# Patient Record
Sex: Male | Born: 1945 | Race: White | Hispanic: No | Marital: Married | Smoking: Never smoker
Health system: Southern US, Community
[De-identification: ages and names within clinical notes are randomized; demographics above are authoritative.]

## PROBLEM LIST (undated history)

## (undated) DIAGNOSIS — D696 Thrombocytopenia, unspecified: Secondary | ICD-10-CM

## (undated) DIAGNOSIS — C801 Malignant (primary) neoplasm, unspecified: Secondary | ICD-10-CM

## (undated) DIAGNOSIS — D72819 Decreased white blood cell count, unspecified: Secondary | ICD-10-CM

## (undated) DIAGNOSIS — D7589 Other specified diseases of blood and blood-forming organs: Secondary | ICD-10-CM

## (undated) DIAGNOSIS — F79 Unspecified intellectual disabilities: Secondary | ICD-10-CM

## (undated) HISTORY — DX: Unspecified intellectual disabilities: F79

## (undated) HISTORY — DX: Malignant (primary) neoplasm, unspecified: C80.1

## (undated) HISTORY — DX: Decreased white blood cell count, unspecified: D72.819

## (undated) HISTORY — DX: Other specified diseases of blood and blood-forming organs: D75.89

## (undated) HISTORY — DX: Thrombocytopenia, unspecified: D69.6

---

## 1999-03-06 ENCOUNTER — Encounter: Payer: Self-pay | Admitting: General Surgery

## 1999-03-06 ENCOUNTER — Encounter: Admission: RE | Admit: 1999-03-06 | Discharge: 1999-03-06 | Payer: Self-pay | Admitting: General Surgery

## 1999-03-08 ENCOUNTER — Ambulatory Visit (HOSPITAL_BASED_OUTPATIENT_CLINIC_OR_DEPARTMENT_OTHER): Admission: RE | Admit: 1999-03-08 | Discharge: 1999-03-08 | Payer: Self-pay | Admitting: General Surgery

## 2005-03-12 ENCOUNTER — Ambulatory Visit: Admission: RE | Admit: 2005-03-12 | Discharge: 2005-06-18 | Payer: Self-pay | Admitting: Radiation Oncology

## 2005-03-21 ENCOUNTER — Encounter: Admission: RE | Admit: 2005-03-21 | Discharge: 2005-03-21 | Payer: Self-pay | Admitting: Urology

## 2005-04-24 ENCOUNTER — Ambulatory Visit (HOSPITAL_BASED_OUTPATIENT_CLINIC_OR_DEPARTMENT_OTHER): Admission: RE | Admit: 2005-04-24 | Discharge: 2005-04-24 | Payer: Self-pay | Admitting: Urology

## 2009-11-22 ENCOUNTER — Ambulatory Visit: Payer: Self-pay | Admitting: Internal Medicine

## 2009-11-22 DIAGNOSIS — F79 Unspecified intellectual disabilities: Secondary | ICD-10-CM | POA: Insufficient documentation

## 2009-11-22 DIAGNOSIS — C61 Malignant neoplasm of prostate: Secondary | ICD-10-CM

## 2009-11-23 LAB — CONVERTED CEMR LAB
ALT: 19 units/L (ref 0–53)
AST: 23 units/L (ref 0–37)
Albumin: 4.3 g/dL (ref 3.5–5.2)
Alkaline Phosphatase: 55 units/L (ref 39–117)
BUN: 17 mg/dL (ref 6–23)
Basophils Absolute: 0 10*3/uL (ref 0.0–0.1)
Basophils Relative: 0.4 % (ref 0.0–3.0)
Bilirubin, Direct: 0.2 mg/dL (ref 0.0–0.3)
CO2: 28 meq/L (ref 19–32)
Calcium: 9.8 mg/dL (ref 8.4–10.5)
Chloride: 107 meq/L (ref 96–112)
Cholesterol: 148 mg/dL (ref 0–200)
Creatinine, Ser: 1.1 mg/dL (ref 0.4–1.5)
Eosinophils Absolute: 0.1 10*3/uL (ref 0.0–0.7)
Eosinophils Relative: 2.5 % (ref 0.0–5.0)
GFR calc non Af Amer: 69.02 mL/min (ref >60)
Glucose, Bld: 89 mg/dL (ref 70–99)
HCT: 39 % (ref 39.0–52.0)
HDL: 65.5 mg/dL (ref >39.00)
Hemoglobin: 13.6 g/dL (ref 13.0–17.0)
LDL Cholesterol: 68 mg/dL (ref 0–99)
Lymphocytes Relative: 27.5 % (ref 12.0–46.0)
Lymphs Abs: 0.8 10*3/uL (ref 0.7–4.0)
MCHC: 35 g/dL (ref 30.0–36.0)
MCV: 130.8 fL — ABNORMAL HIGH (ref 78.0–100.0)
Monocytes Absolute: 0.4 10*3/uL (ref 0.1–1.0)
Monocytes Relative: 14.5 % — ABNORMAL HIGH (ref 3.0–12.0)
Neutro Abs: 1.6 10*3/uL (ref 1.4–7.7)
Neutrophils Relative %: 55.1 % (ref 43.0–77.0)
PSA: 0.01 ng/mL — ABNORMAL LOW (ref 0.10–4.00)
Platelets: 145 10*3/uL — ABNORMAL LOW (ref 150.0–400.0)
Potassium: 4.7 meq/L (ref 3.5–5.1)
RBC: 2.98 M/uL — ABNORMAL LOW (ref 4.22–5.81)
RDW: 22.5 % — ABNORMAL HIGH (ref 11.5–14.6)
Sodium: 143 meq/L (ref 135–145)
TSH: 2.73 microintl units/mL (ref 0.35–5.50)
Total Bilirubin: 1.1 mg/dL (ref 0.3–1.2)
Total CHOL/HDL Ratio: 2
Total Protein: 6.9 g/dL (ref 6.0–8.3)
Triglycerides: 74 mg/dL (ref 0.0–149.0)
VLDL: 14.8 mg/dL (ref 0.0–40.0)
WBC: 2.9 10*3/uL — ABNORMAL LOW (ref 4.5–10.5)

## 2009-11-24 ENCOUNTER — Ambulatory Visit: Payer: Self-pay | Admitting: Internal Medicine

## 2009-11-28 LAB — CONVERTED CEMR LAB: Vitamin B-12: 769 pg/mL (ref 211–911)

## 2010-02-22 ENCOUNTER — Ambulatory Visit
Admission: RE | Admit: 2010-02-22 | Discharge: 2010-02-22 | Payer: Self-pay | Source: Home / Self Care | Attending: Internal Medicine | Admitting: Internal Medicine

## 2010-02-22 ENCOUNTER — Other Ambulatory Visit: Payer: Self-pay | Admitting: Internal Medicine

## 2010-02-22 DIAGNOSIS — D539 Nutritional anemia, unspecified: Secondary | ICD-10-CM | POA: Insufficient documentation

## 2010-02-22 DIAGNOSIS — D72819 Decreased white blood cell count, unspecified: Secondary | ICD-10-CM | POA: Insufficient documentation

## 2010-02-22 LAB — CBC WITH DIFFERENTIAL/PLATELET
Basophils Absolute: 0 10*3/uL (ref 0.0–0.1)
Basophils Relative: 0.4 % (ref 0.0–3.0)
Eosinophils Absolute: 0.1 10*3/uL (ref 0.0–0.7)
Eosinophils Relative: 3.8 % (ref 0.0–5.0)
HCT: 36.4 % — ABNORMAL LOW (ref 39.0–52.0)
Hemoglobin: 13 g/dL (ref 13.0–17.0)
Lymphocytes Relative: 30.3 % (ref 12.0–46.0)
Lymphs Abs: 0.9 10*3/uL (ref 0.7–4.0)
MCHC: 35.8 g/dL (ref 30.0–36.0)
MCV: 130.7 fl — ABNORMAL HIGH (ref 78.0–100.0)
Monocytes Absolute: 0.4 10*3/uL (ref 0.1–1.0)
Monocytes Relative: 12.2 % — ABNORMAL HIGH (ref 3.0–12.0)
Neutro Abs: 1.6 10*3/uL (ref 1.4–7.7)
Neutrophils Relative %: 53.3 % (ref 43.0–77.0)
Platelets: 178 10*3/uL (ref 150.0–400.0)
RBC: 2.78 Mil/uL — ABNORMAL LOW (ref 4.22–5.81)
RDW: 22 % — ABNORMAL HIGH (ref 11.5–14.6)
WBC: 3.1 10*3/uL — ABNORMAL LOW (ref 4.5–10.5)

## 2010-03-23 NOTE — Assessment & Plan Note (Signed)
Summary: 3 MTH ROV // RS   Vital Signs:  Patient profile:   65 year old male Weight:      157 pounds Temp:     98.0 degrees F oral BP sitting:   114 / 76  (right arm) Cuff size:   regular  Vitals Entered By: Duard Brady LPN (February 22, 2010 8:09 AM) CC: 3 mos rov - doing well Is Patient Diabetic? No   CC:  3 mos rov - doing well.  History of Present Illness: 65 -year-old patient who is seen today for follow-up.  He established 3 months ago, and a CBC was obtained that revealed leukopenia, thrombocytopenia with an MCV of 130.  He is a nondrinker. a subsequent B12 level was normal.  He feels well today without constitutional complaints and is maintained.  A normal weight  Allergies (verified): No Known Drug Allergies  Past History:  Past Medical History: mental retardation prostate cancer thrombocytopenia with leukopenia, and macrocytosis  Past Surgical History: Reviewed history from 11/22/2009 and no changes required. status post seed implantation for prostate cancer bilateral inguinal herniorrhaphies  Family History: Reviewed history from 11/22/2009 and no changes required. father died at age 3 mother died young.  A suicide death  No siblings  Social History: Reviewed history from 11/22/2009 and no changes required. Never Smoked patient has never been employed due to mental retardation he lives with a court appointed guardian  Review of Systems  The patient denies anorexia, fever, weight loss, weight gain, vision loss, decreased hearing, hoarseness, chest pain, syncope, dyspnea on exertion, peripheral edema, prolonged cough, headaches, hemoptysis, abdominal pain, melena, hematochezia, severe indigestion/heartburn, hematuria, incontinence, genital sores, muscle weakness, suspicious skin lesions, transient blindness, difficulty walking, depression, unusual weight change, abnormal bleeding, enlarged lymph nodes, angioedema, breast masses, and testicular  masses.    Physical Exam  General:  overweight-appearing.  normal blood pressureoverweight-appearing.   Head:  Normocephalic and atraumatic without obvious abnormalities. No apparent alopecia or balding. Mouth:  multiple missing teeth benign chronic nodular lesion at the floor of the mouth Neck:  No deformities, masses, or tenderness noted. Lungs:  Normal respiratory effort, chest expands symmetrically. Lungs are clear to auscultation, no crackles or wheezes. Heart:  Normal rate and regular rhythm. S1 and S2 normal without gallop, murmur, click, rub or other extra sounds. 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 Qd 2)  Otc Allery Pill  .... Qd  Patient Instructions: 1)  Please schedule a follow-up appointment in 6 months. 2)  Limit your Sodium (Salt) to less than 2 grams a day(slightly less than 1/2 a teaspoon) to prevent fluid retention, swelling, or worsening of symptoms. 3)  It is important that you exercise regularly at least 20 minutes 5 times a week. If you develop chest pain, have severe difficulty breathing, or feel very tired , stop exercising immediately and seek medical attention. 4)  You need to lose weight. Consider a lower calorie diet and regular exercise.    Orders Added: 1)  Est. Patient Level III [65784] 2)  Venipuncture [69629] 3)  TLB-CBC Platelet -  w/Differential [85025-CBCD] 4)  Specimen Handling [99000]

## 2010-03-23 NOTE — Assessment & Plan Note (Signed)
Summary: BRAND NEW PT/TO EST/PT REQ CPX/PT COMING IN FASTING/CJR   Vital Signs:  Patient profile:   65 year old male Height:      62.5 inches Weight:      163 pounds BMI:     29.44 Temp:     97.5 degrees F oral BP sitting:   100 / 68  (left arm) Cuff size:   regular  Vitals Entered By: Duard Brady LPN (November 22, 2009 8:48 AM) CC: new to establish  Is Patient Diabetic? No   CC:  new to establish .  History of Present Illness: 65 -year-old patient who is seen today to establish  with our practice.  He is accompanied by his court appointed guardian due to his mental incompetence.  He is followed by urology for prostate cancer and is status post seed implantation about 6 to 8 years ago.  He takes no chronic medications.  No concerns or complaints.  Here for Medicare AWV:  1.   Risk factors based on Past M, S, F history:  no identifiable risk factors other than age and sex 2.   Physical Activities: minimal due to cognitive impairment 3.   Depression/mood: no history of depression or mood disorder, although cognitively impaired 4.   Hearing: no gross deficits 5.   ADL's: dependent in all aspects of daily living due to cognitive dysfunction 6.   Fall Risk: moderate 7.   Home Safety: no problems identified 8.   Height, weight, &visual acuity:  height and weight stable.  No difficulty with visual acuity 9.   Counseling: will encourage  modest weight loss, and more regular exercise 10.   Labs ordered based on risk factors: laboratory profile, including lipid panel will be reviewed 11.           Referral coronation- follow-up urology 12.           Care Plan- follow-up urology will consider screening colonoscopy 13.            Cognitive Assessment-  moderate cognitive impairment, required assistance in all aspects of daily living.  Patient has a court appointed guardian   Preventive Screening-Counseling & Management  Alcohol-Tobacco     Smoking Status: never  Allergies  (verified): No Known Drug Allergies  Past History:  Past Medical History: mental retardation prostate cancer  Past Surgical History: status post seed implantation for prostate cancer bilateral inguinal herniorrhaphies  Family History: Reviewed history and no changes required. father died at age 59 mother died young.  A suicide death  No siblings  Social History: Reviewed history and no changes required. Never Smoked patient has never been employed due to mental retardation he lives with a court appointed guardianSmoking Status:  never  Review of Systems  The patient denies anorexia, fever, weight loss, weight gain, vision loss, decreased hearing, hoarseness, chest pain, syncope, dyspnea on exertion, peripheral edema, prolonged cough, headaches, hemoptysis, abdominal pain, melena, hematochezia, severe indigestion/heartburn, hematuria, incontinence, genital sores, muscle weakness, suspicious skin lesions, transient blindness, difficulty walking, depression, unusual weight change, abnormal bleeding, enlarged lymph nodes, angioedema, breast masses, and testicular masses.    Physical Exam  General:  overweight-appearing.  120/82 Head:  Normocephalic and atraumatic without obvious abnormalities. No apparent alopecia or balding. Eyes:  No corneal or conjunctival inflammation noted. EOMI. Perrla. Funduscopic exam benign, without hemorrhages, exudates or papilledema. Vision grossly normal. Ears:  External ear exam shows no significant lesions or deformities.  Otoscopic examination reveals clear canals, tympanic membranes are intact  bilaterally without bulging, retraction, inflammation or discharge. Hearing is grossly normal bilaterally. cerumen in both canals Nose:  External nasal examination shows no deformity or inflammation. Nasal mucosa are pink and moist without lesions or exudates. Mouth:  Oral mucosa and oropharynx without lesions or exudates.  Teeth in poor repair with numerous  missing teeth Neck:  No deformities, masses, or tenderness noted. Chest Wall:  No deformities, masses, tenderness or gynecomastia noted. Breasts:  No masses or gynecomastia noted Lungs:  Normal respiratory effort, chest expands symmetrically. Lungs are clear to auscultation, no crackles or wheezes. Heart:  Normal rate and regular rhythm. S1 and S2 normal without gallop, murmur, click, rub or other extra sounds. Abdomen:  Bowel sounds positive,abdomen soft and non-tender without masses, organomegaly or hernias noted. Genitalia:  patient apparently has a single atrophic testicle Msk:  No deformity or scoliosis noted of thoracic or lumbar spine.   Pulses:  R and L carotid,radial,femoral,dorsalis pedis and posterior tibial pulses are full and equal bilaterally Extremities:  No clubbing, cyanosis, edema, or deformity noted with normal full range of motion of all joints.   Neurologic:  alert & oriented X3, cranial nerves II-XII intact, sensation intact to pinprick, gait normal, and DTRs symmetrical and normal.   Skin:  Intact without suspicious lesions or rashes Cervical Nodes:  No lymphadenopathy noted Axillary Nodes:  No palpable lymphadenopathy Inguinal Nodes:  No significant adenopathy Psych:  poor concentration, judgment poor, and hyperactive.     Impression & Recommendations:  Problem # 1:  ADENOCARCINOMA, PROSTATE (ICD-185)  Orders: Venipuncture (16109) TLB-Lipid Panel (80061-LIPID) TLB-BMP (Basic Metabolic Panel-BMET) (80048-METABOL) TLB-CBC Platelet - w/Differential (85025-CBCD) TLB-Hepatic/Liver Function Pnl (80076-HEPATIC) TLB-TSH (Thyroid Stimulating Hormone) (84443-TSH) TLB-PSA (Prostate Specific Antigen) (84153-PSA)  Problem # 2:  MENTAL RETARDATION (ICD-319)  Orders: Venipuncture (60454) TLB-Lipid Panel (80061-LIPID) TLB-BMP (Basic Metabolic Panel-BMET) (80048-METABOL) TLB-CBC Platelet - w/Differential (85025-CBCD) TLB-Hepatic/Liver Function Pnl  (80076-HEPATIC) TLB-TSH (Thyroid Stimulating Hormone) (84443-TSH)  Complete Medication List: 1)  Multivitamins Tabs (Multiple vitamin) .... Qd 2)  Otc Allery Pill  .... Qd  Other Orders: Medicare -1st Annual Wellness Visit [phone])  Patient Instructions: 1)  Limit your Sodium (Salt). 2)  It is important that you exercise regularly at least 20 minutes 5 times a week. If you develop chest pain, have severe difficulty breathing, or feel very tired , stop exercising immediately and seek medical attention. 3)  You need to lose weight. Consider a lower calorie diet and regular exercise.   Appended Document: Orders Update    Clinical Lists Changes  Orders: Added new Service order of Specimen Handling (91478) - Signed

## 2010-03-23 NOTE — Assessment & Plan Note (Signed)
Summary: b12 injection    kik  Nurse Visit   Vitals Entered By: Duard Brady LPN (November 24, 2009 8:20 AM)  Allergies: No Known Drug Allergies  Medication Administration  Injection # 1:    Medication: Vit B12 1000 mcg    Diagnosis: PREVENTIVE HEALTH CARE (ICD-V70.0)    Route: IM    Site: R deltoid    Exp Date: 05/2011    Lot #: [id number]    Mfr: [employer]    Patient tolerated injection without complications    Given by: Duard Brady LPN (November 24, 2009 8:21 AM)  Orders Added: 1)  Vit B12 1000 mcg [J3420] 2)  Admin of Therapeutic Inj  intramuscular or subcutaneous [82956]

## 2010-07-07 NOTE — Op Note (Signed)
NAME:  Reginald Daniels, Reginald Daniels             ACCOUNT NO.:  [id number]   MEDICAL RECORD NO.:  [id number]          PATIENT TYPE:  AMB   LOCATION:  [facility]                         FACILITY:  [facility]   PHYSICIAN:  Excell Seltzer. Annabell Howells, M.D.    DATE OF BIRTH:  [birth year]   DATE OF PROCEDURE:  04/24/2005  DATE OF DISCHARGE:                                 OPERATIVE REPORT   PROCEDURE:  Prostate seed implantation.   PREOPERATIVE DIAGNOSIS:  Prostate cancer.   POSTOPERATIVE DIAGNOSIS:  Prostate cancer.   SURGEON:  Dr. Bjorn Pippin.   RADIATION ONCOLOGIST:  Dr. Billie Lade.   ANESTHESIA:  General.   SPECIMEN:  None.   DRAINS:  Foley catheter.   COMPLICATIONS:  None.   INDICATIONS:  Reginald Daniels is a 65 year old white male with a history of an  elevated PSA and a microfocus of a Gleason 6 adenocarcinoma involving the  left apex of the prostate. It was felt that seed implantation was  appropriate treatment for his condition.   FINDINGS/PROCEDURE:  The patient was given IV Cipro, he was taken to the  operating room where a general anesthetic was induced. He was placed in  lithotomy position. A Foley catheter was inserted using sterile technique.  The balloon was filled with dilute contrast. The scrotum and genitalia were  draped out of the field using OpSite. The ultrasound probe was assembled and  inserted and secured to the Nucletron device. The perineum was prepped, the  seed implant grid was placed. The stabilization needles were placed and the  real-time seed implant planning was performed.   After the dosimetry had been completed, the implant was performed, a total  of 19 needles with 47 seeds were implanted according to the plan. During the  implant, the needle at [facility] did not deployed correctly and left 4 seeds in the  space between the prostate and the perineum. Imaging at the end of the case  revealed these were 2 cm or more beneath the skin. It was not felt that  additional seeds were indicated at  this point the seed type was I125 C  select U1.   After completion of the seed implant, a fluoroscopic film was obtained.   He then underwent removal of his Foley catheter reprep of the penis and  cystoscopy with the 16-French flexible scope. This examination revealed no  evidence of urethral abnormalities, the external sphincter was intact. 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 No seeds were noted  within the prostatic urethra or bladder. The bladder was otherwise  unremarkable. The ureteral orifices were unremarkable.   After completion of the cystoscopy, a fresh Foley catheter was inserted and  placed to straight drainage.   The patient was taken down from lithotomy position, his anesthetic was  reversed, he was moved to the recovery room in stable condition and there  were no complications.      Excell Seltzer. Annabell Howells, M.D.  Electronically Signed     JJW/MEDQ  D:  04/24/2005  T:  04/25/2005  Job:  [id number]   cc:   Gabriel Earing, M.D.  Fax:  [phone]   Billie Lade, M.D.  Fax: [phone]

## 2010-07-07 NOTE — Op Note (Signed)
[place]  Patient:    Reginald Daniels                     MRN: [id number] Proc. Date: 03/08/99 Adm. Date:  [id number] Attending:  Glenna Fellows [place]                           Operative Report  PREOPERATIVE DIAGNOSIS: 1. Recurrent right inguinal hernia. 2. Left inguinal hernia.  POSTOPERATIVE DIAGNOSIS: 1. 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 2. Left inguinal hernia.  OPERATION PERFORMED: 1. Repair of recurrent right inguinal hernia with orchiectomy. 2. Left inguinal hernia repair.  SURGEON:  Lorne Skeens. Hoxworth, M.D.  ANESTHESIA:  General.  BRIEF HISTORY:  Reginald Daniels is a 65 year old white male with mild mental retardation who presents with a longstanding history of enlarging and painful bulges in the right groin.  He is extremely sensitive to touch and was difficult to examine in the office but I was able to confirm bilateral inguinal hernias.  A discussion with the patient and his caregiver resulted in a recommendation for bilateral inguinal hernia repair using mesh under general anesthesia.  The nature of the procedure, its indications and risks of bleeding, infection and recurrence were discussed and understood preoperatively.  He is now brought to the operating room for this procedure.  DESCRIPTION OF PROCEDURE:  The patient was brought to the operating room and placed in supine position on the operating table and general endotracheal anesthesia was induced.  Broad spectrum antibiotics were given intravenously.  The patient was  shaved and at this point there was apparent a previous right inguinal scar that was not detected previously.  The lower abdomen was sterilely prepped and draped.  initially began on the right side.  The old oblique inguinal scar was sharply excised down through the subcutaneous tissue with cautery.  In the superficial subcutaneous tissue I entered a space that  was filled with serous fluid.  The inguinal canal was more widely exposed and it was apparent there was a very large direct hernia with the attenuated transversalis fascia within this space.  Also  within the inguinal canal was found a hypoplastic right testis with a very short cord that would not allow return of the testicle into the scrotum.  There were lso fairly extensive adhesions between the hernia contents and the cord and testicle. I elected to perform an orchiectomy on this side due to the hypoplastic nature f the testicle and inability to replace it in the scrotum.  The cord was freed up to the level of the internal ring and the testicle freed from inflammatory adhesions and at this point the spermatic cord was clamped in several bites at the internal ring and divided and the specimen removed and the pedicle tied with 2-0 silk. he attenuated transversalis fascia of the floor of the inguinal canal was dissected free from surrounding tissue and reduced.  The hernia defect encompassed the entire inguinal floor back to the internal ring.  The inguinal ligament was dissected ree and identified from the pubic tubercle up to lateral to the internal ring. Medially the scar at the external oblique was dissected up off of the internal oblique back towards the rectus sheath and laterally back into fresh tissue. When the anatomy was clear, the attenuated transversalis fascia was imbricated with  running 2-0 Prolene used to hold the hernia contents reduced.  A piece of Prolene  mesh was trimmed to size to fit the floor of the inguinal canal and extend back  over the internal ring to well lateral to the internal ring.  The mesh was sutured initially to the pubic tubercle and then with a running 2-0 Prolene to the inguinal ligament working well lateral to the internal ring.  Medially the mesh was sutured to the edge of the rectus sheath with interrupted 2-0 Prolene.  Following  this, the external oblique was closed over this with running 3-0 Vicryl.  The subcutaneous tissue was then closed with running 3-0 Vicryl and the skin closed with running  subcuticular 5-0 Monocryl and Steri-Strips.  An oblique incision was then made n the left groin and dissection carried down through the subcutaneous tissues. The external oblique was identified and the external ring divided along the lines of the fibers of the external oblique.  There was a very large hernia mass present. The cord was identified and bluntly dissected up off the floor at the pubic tubercle and cremasteric fibers divided bilaterally up to the internal ring freeing the cord.  The ilioinguinal nerve was identified, dissected free and protected.  The hernia mass was dissected away from surrounding tissue and on this side there was also an extremely large direct defect encompassing the entire floor and really extending back around the internal ring as well.  The transversalis fascia was again imbricated on this side with running 2-0 Prolene holding the hernia reduced. The fascial layers were dissected free identically as on the other side and a large piece of mesh was sutured on this side identically except of course, a slit was  allowed for the spermatic cord and this was closed laterally with interrupted 2-0 Prolene, snugging the internal ring to a fingertip.  Following this, the cord and ilioinguinal nerves were returned to their anatomic position and the external oblique closed with sutures of running 3-0 Vicryl.  Scarpas was closed with running 3-0 Vicryl and the skin with running subcuticular, 5-0 Monocryl and Steri-Strips.  Sponge, needle and instrument counts were correct.  Dry sterile dressings were applied and the patient was taken to recovery in good condition. DD:  03/08/99 TD:  03/08/99 Job: [id number]

## 2010-08-24 ENCOUNTER — Encounter: Payer: Self-pay | Admitting: Internal Medicine

## 2010-08-25 ENCOUNTER — Encounter: Payer: Self-pay | Admitting: Internal Medicine

## 2010-08-25 ENCOUNTER — Ambulatory Visit (INDEPENDENT_AMBULATORY_CARE_PROVIDER_SITE_OTHER): Payer: Medicare Other | Admitting: Internal Medicine

## 2010-08-25 DIAGNOSIS — D72819 Decreased white blood cell count, unspecified: Secondary | ICD-10-CM

## 2010-08-25 DIAGNOSIS — C61 Malignant neoplasm of prostate: Secondary | ICD-10-CM

## 2010-08-25 DIAGNOSIS — F79 Unspecified intellectual disabilities: Secondary | ICD-10-CM

## 2010-08-25 DIAGNOSIS — D539 Nutritional anemia, unspecified: Secondary | ICD-10-CM

## 2010-08-25 NOTE — Progress Notes (Signed)
  Subjective:    Patient ID: Willaim Daniels, male    DOB: 1945-08-05, 65 y.o.   MRN: [id number]  HPI  65 year old patient who is seen today for followup. He has a history of mental retardation. Medical problems include prostate cancer. A PSA was checked last visit and was 0.01;  he also has a history of macrocytosis. B12 level was checked last visit and was normal. He maintains a normal diet. No concerns or complaints he does have a history of mild allergic rhinitis which has been stable    Review of Systems  Constitutional: Negative for fever, chills, appetite change and fatigue.  HENT: Negative for hearing loss, ear pain, congestion, sore throat, trouble swallowing, neck stiffness, dental problem, voice change and tinnitus.   Eyes: Negative for pain, discharge and visual disturbance.  Respiratory: Negative for cough, chest tightness, wheezing and stridor.   Cardiovascular: Negative for chest pain, palpitations and leg swelling.  Gastrointestinal: Negative for nausea, vomiting, abdominal pain, diarrhea, constipation, blood in stool and abdominal distention.  Genitourinary: Negative for urgency, hematuria, flank pain, discharge, difficulty urinating and genital sores.  Musculoskeletal: Negative for myalgias, back pain, joint swelling, arthralgias and gait problem.  Skin: Negative for rash.  Neurological: Negative for dizziness, syncope, speech difficulty, weakness, numbness and headaches.  Hematological: Negative for adenopathy. Does not bruise/bleed easily.  Psychiatric/Behavioral: Negative for behavioral problems and dysphoric mood. The patient is not nervous/anxious.        Objective:   Physical Exam  Constitutional: He is oriented to person, place, and time. He appears well-developed.       No distress. BP blood pressure 110/70. Moderate mental retardation  HENT:  Head: Normocephalic.  Right Ear: External ear normal.  Left Ear: External ear normal.  Eyes: Conjunctivae and EOM  are normal.  Neck: Normal range of motion.  Cardiovascular: Normal rate and normal heart sounds.   Pulmonary/Chest: Breath sounds normal.  Abdominal: Bowel sounds are normal.  Musculoskeletal: Normal range of motion. He exhibits no edema and no tenderness.  Neurological: He is alert and oriented to person, place, and time.  Psychiatric: He has a normal mood and affect. His behavior is normal.          Assessment & Plan:   Mental retardation Macrocytosis History of prostate cancer  We'll recheck in 6 months. Lab update at that time

## 2010-08-25 NOTE — Patient Instructions (Signed)
Return in 6 months for followup    It is important that you exercise regularly, at least 20 minutes 3 to 4 times per week.  If you develop chest pain or shortness of breath seek  medical attention. 

## 2011-02-26 ENCOUNTER — Ambulatory Visit (INDEPENDENT_AMBULATORY_CARE_PROVIDER_SITE_OTHER): Payer: Medicare Other | Admitting: Internal Medicine

## 2011-02-26 ENCOUNTER — Encounter: Payer: Self-pay | Admitting: Internal Medicine

## 2011-02-26 DIAGNOSIS — C61 Malignant neoplasm of prostate: Secondary | ICD-10-CM

## 2011-02-26 DIAGNOSIS — F79 Unspecified intellectual disabilities: Secondary | ICD-10-CM

## 2011-02-26 DIAGNOSIS — D539 Nutritional anemia, unspecified: Secondary | ICD-10-CM

## 2011-02-26 NOTE — Patient Instructions (Signed)
It is important that you exercise regularly, at least 20 minutes 3 to 4 times per week.  If you develop chest pain or shortness of breath seek  medical attention.  Limit your sodium (Salt) intake   

## 2011-02-26 NOTE — Progress Notes (Signed)
  Subjective:    Patient ID: Reginald Daniels, male    DOB: [birth year], 66 y.o.   MRN: [id number]  HPI  66 year old patient who is in today for followup. He has a history of mental retardation macrocytic anemia. He does quite well and takes no chronic medications. No concerns or complaints today. He does have a history of prostate cancer.    Review of Systems  Constitutional: Negative for fever, chills, appetite change and fatigue.  HENT: Negative for hearing loss, ear pain, congestion, sore throat, trouble swallowing, neck stiffness, dental problem, voice change and tinnitus.   Eyes: Negative for pain, discharge and visual disturbance.  Respiratory: Negative for cough, chest tightness, wheezing and stridor.   Cardiovascular: Negative for chest pain, palpitations and leg swelling.  Gastrointestinal: Negative for nausea, vomiting, abdominal pain, diarrhea, constipation, blood in stool and abdominal distention.  Genitourinary: Negative for urgency, hematuria, flank pain, discharge, difficulty urinating and genital sores.  Musculoskeletal: Negative for myalgias, back pain, joint swelling, arthralgias and gait problem.  Skin: Negative for rash.  Neurological: Negative for dizziness, syncope, speech difficulty, weakness, numbness and headaches.  Hematological: Negative for adenopathy. Does not bruise/bleed easily.  Psychiatric/Behavioral: Negative for behavioral problems and dysphoric mood. The patient is not nervous/anxious.        Objective:   Physical Exam  Constitutional: He is oriented to person, place, and time. He appears well-developed.  HENT:  Head: Normocephalic.  Right Ear: External ear normal.  Left Ear: External ear normal.  Eyes: Conjunctivae and EOM are normal.  Neck: Normal range of motion.  Cardiovascular: Normal rate and normal heart sounds.   Pulmonary/Chest: Breath sounds normal.  Abdominal: Bowel sounds are normal.  Musculoskeletal: Normal range of motion. He  exhibits no edema and no tenderness.  Neurological: He is alert and oriented to person, place, and time.  Psychiatric: He has a normal mood and affect. His behavior is normal.          Assessment & Plan:   Mental retardation Prostate cancer. Will see in 3-4 months for an annual exam History macrocytic anemia

## 2011-05-29 ENCOUNTER — Ambulatory Visit (INDEPENDENT_AMBULATORY_CARE_PROVIDER_SITE_OTHER): Payer: Medicare Other | Admitting: Internal Medicine

## 2011-05-29 ENCOUNTER — Encounter: Payer: Self-pay | Admitting: Internal Medicine

## 2011-05-29 VITALS — BP 122/76 | HR 60 | Temp 98.1°F | Resp 20 | Ht 62.0 in | Wt 162.0 lb

## 2011-05-29 DIAGNOSIS — Z136 Encounter for screening for cardiovascular disorders: Secondary | ICD-10-CM

## 2011-05-29 DIAGNOSIS — F79 Unspecified intellectual disabilities: Secondary | ICD-10-CM

## 2011-05-29 DIAGNOSIS — D539 Nutritional anemia, unspecified: Secondary | ICD-10-CM

## 2011-05-29 DIAGNOSIS — Z23 Encounter for immunization: Secondary | ICD-10-CM

## 2011-05-29 DIAGNOSIS — D72819 Decreased white blood cell count, unspecified: Secondary | ICD-10-CM

## 2011-05-29 DIAGNOSIS — Z0000 Encounter for general adult medical examination without abnormal findings: Secondary | ICD-10-CM

## 2011-05-29 DIAGNOSIS — C61 Malignant neoplasm of prostate: Secondary | ICD-10-CM

## 2011-05-29 LAB — COMPREHENSIVE METABOLIC PANEL
ALT: 21 U/L (ref 0–53)
AST: 22 U/L (ref 0–37)
Albumin: 4.4 g/dL (ref 3.5–5.2)
Alkaline Phosphatase: 54 U/L (ref 39–117)
BUN: 19 mg/dL (ref 6–23)
Chloride: 106 mEq/L (ref 96–112)
Potassium: 4.6 mEq/L (ref 3.5–5.1)
Sodium: 144 mEq/L (ref 135–145)

## 2011-05-29 LAB — CBC WITH DIFFERENTIAL/PLATELET
Basophils Absolute: 0 10*3/uL (ref 0.0–0.1)
Basophils Relative: 0.5 % (ref 0.0–3.0)
Eosinophils Absolute: 0.1 10*3/uL (ref 0.0–0.7)
MCHC: 34.2 g/dL (ref 30.0–36.0)
MCV: 133.6 fl — ABNORMAL HIGH (ref 78.0–100.0)
Monocytes Absolute: 0.4 10*3/uL (ref 0.1–1.0)
Neutro Abs: 1.7 10*3/uL (ref 1.4–7.7)
Neutrophils Relative %: 55.5 % (ref 43.0–77.0)
RBC: 2.9 Mil/uL — ABNORMAL LOW (ref 4.22–5.81)
RDW: 19.6 % — ABNORMAL HIGH (ref 11.5–14.6)

## 2011-05-29 LAB — LIPID PANEL
Cholesterol: 179 mg/dL (ref 0–200)
LDL Cholesterol: 97 mg/dL (ref 0–99)
Total CHOL/HDL Ratio: 3
VLDL: 15.8 mg/dL (ref 0.0–40.0)

## 2011-05-29 NOTE — Patient Instructions (Signed)
It is important that you exercise regularly, at least 20 minutes 3 to 4 times per week.  If you develop chest pain or shortness of breath seek  medical attention.  You need to lose weight.  Consider a lower calorie diet and regular exercise.  Return in one year for follow-up   

## 2011-05-29 NOTE — Progress Notes (Signed)
Subjective:    Patient ID: Reginald Daniels, male    DOB: Jan 15, 1946, 66 y.o.   MRN: [id number]  HPI  66 year old patient who is seen today for a preventive health examination. He has a history of prostate cancer and is followed by urology. He has a history of mental retardation. His only medication is occasional Allegra for allergy-related symptoms. He has a history of macrocytic anemia with mild leukopenia. He is doing well today without concerns or complaints.  Here for Medicare AWV:   1. Risk factors based on Past M, S, F history: no identifiable risk factors other than age and sex  2. Physical Activities: minimal due to cognitive impairment  3. Depression/mood: no history of depression or mood disorder, although cognitively impaired  4. Hearing: no gross deficits  5. ADL's: dependent in all aspects of daily living due to cognitive dysfunction  6. Fall Risk: moderate  7. Home Safety: no problems identified  8. Height, weight, &visual acuity: height and weight stable. No difficulty with visual acuity  9. Counseling: will encourage modest weight loss, and more regular exercise  10. Labs ordered based on risk factors: laboratory profile, including lipid panel will be reviewed  11. Referral coronation- follow-up urology  12. Care Plan- follow-up urology will consider screening colonoscopy  13. Cognitive Assessment- moderate cognitive impairment, required assistance in all aspects of daily living. Patient has a court appointed guardian   Preventive Screening-Counseling & Management  Alcohol-Tobacco  Smoking Status: never   Allergies (verified):  No Known Drug Allergies   Past History:  Past Medical History:  mental retardation  prostate cancer   Past Surgical History:  status post seed implantation for prostate cancer  bilateral inguinal herniorrhaphies   Family History:  Reviewed history and no changes required.  father died at age 63  mother died young. A suicide death    No siblings   Social History:  Reviewed history and no changes required.  Never Smoked  patient has never been employed due to mental retardation  he lives with a court appointed guardianSmoking Status: never     Review of Systems  Constitutional: Negative for fever, chills, appetite change and fatigue.  HENT: Negative for hearing loss, ear pain, congestion, sore throat, trouble swallowing, neck stiffness, dental problem, voice change and tinnitus.   Eyes: Negative for pain, discharge and visual disturbance.  Respiratory: Negative for cough, chest tightness, wheezing and stridor.   Cardiovascular: Negative for chest pain, palpitations and leg swelling.  Gastrointestinal: Negative for nausea, vomiting, abdominal pain, diarrhea, constipation, blood in stool and abdominal distention.  Genitourinary: Negative for urgency, hematuria, flank pain, discharge, difficulty urinating and genital sores.  Musculoskeletal: Negative for myalgias, back pain, joint swelling, arthralgias and gait problem.  Skin: Negative for rash.  Neurological: Negative for dizziness, syncope, speech difficulty, weakness, numbness and headaches.  Hematological: Negative for adenopathy. Does not bruise/bleed easily.  Psychiatric/Behavioral: Negative for behavioral problems and dysphoric mood. The patient is not nervous/anxious.        Objective:   Physical Exam  Constitutional: He appears well-developed and well-nourished.  HENT:  Head: Normocephalic and atraumatic.  Right Ear: External ear normal.  Left Ear: External ear normal.  Nose: Nose normal.  Mouth/Throat: Oropharynx is clear and moist.  Eyes: Conjunctivae and EOM are normal. Pupils are equal, round, and reactive to light. No scleral icterus.  Neck: Normal range of motion. Neck supple. No JVD present. No thyromegaly present.  Cardiovascular: Regular rhythm, normal heart sounds and intact distal  pulses.  Exam reveals no gallop and no friction rub.   No  murmur heard. Pulmonary/Chest: Effort normal and breath sounds normal. He exhibits no tenderness.  Abdominal: Soft. Bowel sounds are normal. He exhibits no distension and no mass. There is no tenderness.  Musculoskeletal: Normal range of motion. He exhibits edema. He exhibits no tenderness.       +1 lower extremity edema distal to the knees  Lymphadenopathy:    He has no cervical adenopathy.  Neurological: He is alert. He has normal reflexes. No cranial nerve deficit. Coordination normal.  Skin: Skin is warm and dry. No rash noted.       Scattered excoriations over his anterior lower legs  Psychiatric: He has a normal mood and affect. His behavior is normal.       Mental retardation with cognitive dysfunction. Pleasant no distress          Assessment & Plan:    Preventive health examination History of prostate cancer. Followed by urology History of macrocytosis and mild leukopenia. We'll followup a CBC   Recheck one year

## 2011-10-15 ENCOUNTER — Encounter: Payer: Self-pay | Admitting: Internal Medicine

## 2011-10-15 ENCOUNTER — Telehealth: Payer: Self-pay | Admitting: Internal Medicine

## 2011-10-15 NOTE — Telephone Encounter (Signed)
Pt rcvd [employer]. Pt needs to get a Letter written from Dr Amador Cunas, stating that pt in [employer] and can not serve on jury. Need to pick up letter end of wk. Pls call when ready.

## 2011-10-15 NOTE — Telephone Encounter (Signed)
Reginald Daniels aware letter ready for pick up

## 2011-12-31 ENCOUNTER — Ambulatory Visit (INDEPENDENT_AMBULATORY_CARE_PROVIDER_SITE_OTHER): Payer: Medicare Other | Admitting: Internal Medicine

## 2011-12-31 DIAGNOSIS — Z23 Encounter for immunization: Secondary | ICD-10-CM

## 2013-02-24 ENCOUNTER — Ambulatory Visit: Payer: Medicare Other

## 2013-02-24 ENCOUNTER — Other Ambulatory Visit: Payer: Self-pay

## 2013-02-24 ENCOUNTER — Ambulatory Visit (INDEPENDENT_AMBULATORY_CARE_PROVIDER_SITE_OTHER): Payer: Medicare Other | Admitting: Internal Medicine

## 2013-02-24 ENCOUNTER — Encounter: Payer: Self-pay | Admitting: Internal Medicine

## 2013-02-24 ENCOUNTER — Other Ambulatory Visit: Payer: Medicare Other

## 2013-02-24 VITALS — BP 130/80 | HR 75 | Temp 97.6°F | Resp 20 | Ht 61.5 in | Wt 156.0 lb

## 2013-02-24 DIAGNOSIS — D649 Anemia, unspecified: Secondary | ICD-10-CM

## 2013-02-24 DIAGNOSIS — C61 Malignant neoplasm of prostate: Secondary | ICD-10-CM

## 2013-02-24 DIAGNOSIS — F79 Unspecified intellectual disabilities: Secondary | ICD-10-CM

## 2013-02-24 DIAGNOSIS — Z0000 Encounter for general adult medical examination without abnormal findings: Secondary | ICD-10-CM

## 2013-02-24 DIAGNOSIS — D539 Nutritional anemia, unspecified: Secondary | ICD-10-CM

## 2013-02-24 DIAGNOSIS — D72819 Decreased white blood cell count, unspecified: Secondary | ICD-10-CM

## 2013-02-24 DIAGNOSIS — Z136 Encounter for screening for cardiovascular disorders: Secondary | ICD-10-CM

## 2013-02-24 LAB — CBC WITH DIFFERENTIAL/PLATELET
BASOS PCT: 0.3 % (ref 0.0–3.0)
Basophils Absolute: 0 10*3/uL (ref 0.0–0.1)
EOS PCT: 2.4 % (ref 0.0–5.0)
Eosinophils Absolute: 0.1 10*3/uL (ref 0.0–0.7)
HCT: 35.6 % — ABNORMAL LOW (ref 39.0–52.0)
Hemoglobin: 12.1 g/dL — ABNORMAL LOW (ref 13.0–17.0)
Lymphocytes Relative: 24.9 % (ref 12.0–46.0)
Lymphs Abs: 0.7 10*3/uL (ref 0.7–4.0)
MCHC: 34 g/dL (ref 30.0–36.0)
MCV: 129.9 fl — AB (ref 78.0–100.0)
MONO ABS: 0.4 10*3/uL (ref 0.1–1.0)
MONOS PCT: 16.2 % — AB (ref 3.0–12.0)
NEUTROS PCT: 56.2 % (ref 43.0–77.0)
Neutro Abs: 1.5 10*3/uL (ref 1.4–7.7)
PLATELETS: 154 10*3/uL (ref 150.0–400.0)
RBC: 2.74 Mil/uL — AB (ref 4.22–5.81)
RDW: 18.6 % — ABNORMAL HIGH (ref 11.5–14.6)
WBC: 2.7 10*3/uL — AB (ref 4.5–10.5)

## 2013-02-24 LAB — COMPREHENSIVE METABOLIC PANEL
ALT: 17 U/L (ref 0–53)
AST: 20 U/L (ref 0–37)
Albumin: 4.3 g/dL (ref 3.5–5.2)
Alkaline Phosphatase: 55 U/L (ref 39–117)
BILIRUBIN TOTAL: 0.9 mg/dL (ref 0.3–1.2)
BUN: 16 mg/dL (ref 6–23)
CALCIUM: 9.8 mg/dL (ref 8.4–10.5)
CO2: 28 meq/L (ref 19–32)
CREATININE: 1.2 mg/dL (ref 0.4–1.5)
Chloride: 104 mEq/L (ref 96–112)
GFR: 66.31 mL/min (ref >60.00)
GLUCOSE: 80 mg/dL (ref 70–99)
Potassium: 4 mEq/L (ref 3.5–5.1)
Sodium: 139 mEq/L (ref 135–145)
Total Protein: 6.8 g/dL (ref 6.0–8.3)

## 2013-02-24 LAB — LIPID PANEL
CHOL/HDL RATIO: 2
CHOLESTEROL: 137 mg/dL (ref 0–200)
HDL: 58.8 mg/dL (ref >39.00)
LDL Cholesterol: 70 mg/dL (ref 0–99)
TRIGLYCERIDES: 40 mg/dL (ref 0.0–149.0)
VLDL: 8 mg/dL (ref 0.0–40.0)

## 2013-02-24 LAB — PSA: PSA: 0 ng/mL — AB (ref 0.10–4.00)

## 2013-02-24 LAB — TSH: TSH: 3.57 u[IU]/mL (ref 0.35–5.50)

## 2013-02-24 NOTE — Progress Notes (Signed)
Pre-visit discussion using our clinic review tool. No additional management support is needed unless otherwise documented below in the visit note.  

## 2013-02-24 NOTE — Patient Instructions (Signed)
Limit your sodium (Salt) intake    It is important that you exercise regularly, at least 20 minutes 3 to 4 times per week.  If you develop chest pain or shortness of breath seek  medical attention.  Return in one year for follow-up   

## 2013-02-24 NOTE — Progress Notes (Signed)
Subjective:    Patient ID: Reginald Daniels, male    DOB: Jul 18, 1945, 68 y.o.   MRN: [id number]  HPI 68 -year-old patient who is seen today for a preventive health examination. He has a history of prostate cancer and is followed by urology. He has a history of mental retardation. His only medication is occasional Allegra for allergy-related symptoms. He has a history of macrocytic anemia with mild leukopenia. He is doing well today without concerns or complaints.  Here for Medicare AWV:   1. Risk factors based on Past M, S, F history: no identifiable risk factors other than age and sex  2. Physical Activities: minimal due to cognitive impairment  3. Depression/mood: no history of depression or mood disorder, although cognitively impaired  4. Hearing: no gross deficits  5. ADL's: dependent in all aspects of daily living due to cognitive dysfunction  6. Fall Risk: moderate  7. Home Safety: no problems identified  8. Height, weight, &visual acuity: height and weight stable. No difficulty with visual acuity  9. Counseling: will encourage modest weight loss, and more regular exercise  10. Labs ordered based on risk factors: laboratory profile, including lipid panel will be reviewed  11. Referral coronation- follow-up urology  12. Care Plan- follow-up urology will consider screening colonoscopy  13. Cognitive Assessment- moderate cognitive impairment, required assistance in all aspects of daily living. Patient has a court appointed guardian   Preventive Screening-Counseling & Management  Alcohol-Tobacco  Smoking Status: never   Allergies (verified):  No Known Drug Allergies   Past History:  Past Medical History:  mental retardation  prostate cancer   Past Surgical History:  status post seed implantation for prostate cancer  bilateral inguinal herniorrhaphies   Family History:  Reviewed history and no changes required.  father died at age 34  mother died young. A suicide death   No siblings   Social History:  Reviewed history and no changes required.  Never Smoked  patient has never been employed due to mental retardation  he lives with a court appointed guardianSmoking Status: never     Review of Systems  Constitutional: Negative for fever, chills, appetite change and fatigue.  HENT: Negative for congestion, dental problem, ear pain, hearing loss, sore throat, tinnitus, trouble swallowing and voice change.   Eyes: Negative for pain, discharge and visual disturbance.  Respiratory: Negative for cough, chest tightness, wheezing and stridor.   Cardiovascular: Negative for chest pain, palpitations and leg swelling.  Gastrointestinal: Negative for nausea, vomiting, abdominal pain, diarrhea, constipation, blood in stool and abdominal distention.  Genitourinary: Negative for urgency, hematuria, flank pain, discharge, difficulty urinating and genital sores.  Musculoskeletal: Negative for arthralgias, back pain, gait problem, joint swelling, myalgias and neck stiffness.  Skin: Negative for rash.  Neurological: Negative for dizziness, syncope, speech difficulty, weakness, numbness and headaches.  Hematological: Negative for adenopathy. Does not bruise/bleed easily.  Psychiatric/Behavioral: Negative for behavioral problems and dysphoric mood. The patient is not nervous/anxious.        Objective:   Physical Exam  Constitutional: He appears well-developed and well-nourished.  HENT:  Head: Normocephalic and atraumatic.  Right Ear: External ear normal.  Left Ear: External ear normal.  Nose: Nose normal.  Mouth/Throat: Oropharynx is clear and moist.  Eyes: Conjunctivae and EOM are normal. Pupils are equal, round, and reactive to light. No scleral icterus.  Neck: Normal range of motion. Neck supple. No JVD present. No thyromegaly present.  Cardiovascular: Regular rhythm, normal heart sounds and intact distal pulses.  Exam reveals no gallop and no friction rub.   No  murmur heard. Pulmonary/Chest: Effort normal and breath sounds normal. He exhibits no tenderness.  Abdominal: Soft. Bowel sounds are normal. He exhibits no distension and no mass. There is no tenderness.  Patient very ticklish making the exam suboptimal  Musculoskeletal: Normal range of motion. He exhibits no edema and no tenderness.  Lymphadenopathy:    He has no cervical adenopathy.  Neurological: He is alert. He has normal reflexes. No cranial nerve deficit. Coordination normal.  Skin: Skin is warm and dry. No rash noted.  Scattered excoriations over his anterior lower legs and upper back  Psychiatric: He has a normal mood and affect. His behavior is normal.  Mental retardation with cognitive dysfunction. Pleasant no distress          Assessment & Plan:    Preventive health examination History of prostate cancer. Followed by urology History of macrocytosis and mild leukopenia. We'll followup a CBC   Recheck one year

## 2013-02-25 LAB — VITAMIN B12: Vitamin B-12: 689 pg/mL (ref 211–911)

## 2013-02-25 LAB — FOLATE

## 2014-11-23 ENCOUNTER — Ambulatory Visit (INDEPENDENT_AMBULATORY_CARE_PROVIDER_SITE_OTHER): Payer: Medicare Other

## 2014-11-23 DIAGNOSIS — Z23 Encounter for immunization: Secondary | ICD-10-CM

## 2014-12-15 DIAGNOSIS — K4090 Unilateral inguinal hernia, without obstruction or gangrene, not specified as recurrent: Secondary | ICD-10-CM | POA: Diagnosis not present

## 2014-12-15 DIAGNOSIS — R351 Nocturia: Secondary | ICD-10-CM | POA: Diagnosis not present

## 2014-12-15 DIAGNOSIS — R3121 Asymptomatic microscopic hematuria: Secondary | ICD-10-CM | POA: Diagnosis not present

## 2014-12-15 DIAGNOSIS — Z8546 Personal history of malignant neoplasm of prostate: Secondary | ICD-10-CM | POA: Diagnosis not present

## 2014-12-22 DIAGNOSIS — R3129 Other microscopic hematuria: Secondary | ICD-10-CM | POA: Diagnosis not present

## 2014-12-22 DIAGNOSIS — Z8546 Personal history of malignant neoplasm of prostate: Secondary | ICD-10-CM | POA: Diagnosis not present

## 2014-12-22 DIAGNOSIS — R3121 Asymptomatic microscopic hematuria: Secondary | ICD-10-CM | POA: Diagnosis not present

## 2015-04-01 ENCOUNTER — Encounter (HOSPITAL_COMMUNITY): Payer: Self-pay | Admitting: Emergency Medicine

## 2015-04-01 ENCOUNTER — Emergency Department (HOSPITAL_COMMUNITY)
Admission: EM | Admit: 2015-04-01 | Discharge: 2015-04-01 | Discharge status: Against Medical Advice | Payer: Medicare Other | Attending: Emergency Medicine | Admitting: Emergency Medicine

## 2015-04-01 DIAGNOSIS — S0181XA Laceration without foreign body of other part of head, initial encounter: Secondary | ICD-10-CM | POA: Diagnosis not present

## 2015-04-01 DIAGNOSIS — Y999 Unspecified external cause status: Secondary | ICD-10-CM | POA: Diagnosis not present

## 2015-04-01 DIAGNOSIS — Y9289 Other specified places as the place of occurrence of the external cause: Secondary | ICD-10-CM | POA: Insufficient documentation

## 2015-04-01 DIAGNOSIS — W01198A Fall on same level from slipping, tripping and stumbling with subsequent striking against other object, initial encounter: Secondary | ICD-10-CM | POA: Insufficient documentation

## 2015-04-01 DIAGNOSIS — Y9389 Activity, other specified: Secondary | ICD-10-CM | POA: Insufficient documentation

## 2015-04-01 DIAGNOSIS — S0990XA Unspecified injury of head, initial encounter: Secondary | ICD-10-CM | POA: Diagnosis present

## 2015-04-01 NOTE — ED Notes (Signed)
The patient was called severals times to be roomed and to get his vitals checked and he did not answer.  The patient left without being seen after triage.

## 2015-04-01 NOTE — ED Notes (Signed)
Pt had a mechanical fall tripping over his jacket. Pt struck his head  On the floor. Pt has a laceration to the forehead. NAD at this time. Pt has hx of mild MR.

## 2015-08-24 ENCOUNTER — Ambulatory Visit (INDEPENDENT_AMBULATORY_CARE_PROVIDER_SITE_OTHER): Payer: Medicare Other | Admitting: Family Medicine

## 2015-08-24 ENCOUNTER — Encounter: Payer: Self-pay | Admitting: Family Medicine

## 2015-08-24 ENCOUNTER — Telehealth: Payer: Self-pay | Admitting: Internal Medicine

## 2015-08-24 VITALS — BP 120/74 | HR 86 | Temp 97.7°F | Ht 61.5 in | Wt 149.0 lb

## 2015-08-24 DIAGNOSIS — S0081XA Abrasion of other part of head, initial encounter: Secondary | ICD-10-CM | POA: Diagnosis not present

## 2015-08-24 DIAGNOSIS — I499 Cardiac arrhythmia, unspecified: Secondary | ICD-10-CM | POA: Diagnosis not present

## 2015-08-24 DIAGNOSIS — S80819A Abrasion, unspecified lower leg, initial encounter: Secondary | ICD-10-CM

## 2015-08-24 DIAGNOSIS — S40819A Abrasion of unspecified upper arm, initial encounter: Secondary | ICD-10-CM

## 2015-08-24 NOTE — Progress Notes (Signed)
   Subjective:    Patient ID: Reginald Daniels, male    DOB: 12/29/1945, 70 y.o.   MRN: [id number]  HPI  Patient seen accompanied by his caregiver for concern for possible "irregular heartbeat ". Recently they were checking his blood pressure on his machine and noted "irregular" heartbeat. Pulse was around 90. Patient does not complain of any palpitations. No chest pains. No dyspnea. No dizziness. He does not have any history of arrhythmia such as atrial fibrillation.  He takes no regular medications. He has history of mental retardation  Patient was on the way in to our office today and apparently misstepped when getting onto the sidewalk and fell forward with large abrasion left forehead, left knee, and dorsum of the left hand. No syncope or loss of consciousness. No headache. No nausea or vomiting. No focal weakness. Caregiver states that he sometimes seems to lose balance but has not had any other recent falls. Ambulating without difficulty at this time.  Past Medical History  Diagnosis Date  . Cancer [facility])     prostate  . Mental retardation   . Thrombocytopenia ([facility])   . Leukopenia   . Macrocytosis    No past surgical history on file.  reports that he has never smoked. He has never used smokeless tobacco. He reports that he does not drink alcohol or use illicit drugs. family history is not on file. No Known Allergies   Review of Systems  Constitutional: Negative for fever, chills, appetite change, fatigue and unexpected weight change.  Eyes: Negative for visual disturbance.  Respiratory: Negative for cough, chest tightness and shortness of breath.   Cardiovascular: Negative for chest pain, palpitations and leg swelling.  Neurological: Negative for dizziness, syncope, weakness, light-headedness and headaches.       Objective:   Physical Exam  Constitutional: He appears well-developed and well-nourished. No distress.  HENT:  Right Ear: External ear normal.  Left Ear:  External ear normal.  Patient has 2 fairly large abrasions left forehead No hematoma. No active bleeding.  Eyes: Pupils are equal, round, and reactive to light.  Neck: Neck supple.  Cardiovascular: Normal rate and regular rhythm.  Exam reveals no gallop.   No murmur heard. Pulmonary/Chest: Effort normal and breath sounds normal. No respiratory distress. He has no wheezes. He has no rales.  Musculoskeletal:  Full range of motion left knee. No localized tenderness. Full range of motion left hand and left wrist. No bony tenderness.  Skin:  Superficial abrasion left anterior knee and dorsum left hand.          Assessment & Plan:  #1 concern for possible irregular heart rate. No concerning symptoms. This was noted only on automated cuff recently-not palpated or auscultated.[name] He has totally normal regular pulse at this time. We've recommended observation.  #2 fall coming in office today (mis-stepped getting onto sidewalk) with abrasions left forehead, left anterior knee, and left hand. No active bleeding. Head injury sheet given. Abrasions were cleaned and topical antibiotic applied. Tetanus up-to-date. Follow-up immediately for any headache, confusion, lethargy, or other concerns.  Eulas Post MD [facility]

## 2015-08-24 NOTE — Telephone Encounter (Signed)
FYI - Seeing Dr. Elease Hashimoto at 1:45pm

## 2015-08-24 NOTE — Patient Instructions (Signed)

## 2015-08-24 NOTE — Telephone Encounter (Signed)
Patient Name: Reginald Daniels  DOB: 1945/12/09    Initial Comment Caller states been monitoring pt's bp, says he has irregular heartbeat   Nurse Assessment  Nurse: Leilani Merl, RN, Nira Conn Date/Time (Eastern Time): 08/24/2015 9:39:24 AM  Confirm and document reason for call. If symptomatic, describe symptoms. You must click the next button to save text entered. ---Caller states been monitoring pt's bp, says he has irregular heartbeat, BP and heart rate are within normal limits, O2 sat 98%, no symptoms. The BP monitor has been saying for the last few days that patient has an irregular heart beat.  Has the patient traveled out of the country within the last 30 days? ---Not Applicable  Does the patient have any new or worsening symptoms? ---Yes  Will a triage be completed? ---Yes  Related visit to physician within the last 2 weeks? ---No  Does the PT have any chronic conditions? (i.e. diabetes, asthma, etc.) ---Yes  List chronic conditions. ---See MR  Is this a behavioral health or substance abuse call? ---No     Guidelines    Guideline Title Affirmed Question Affirmed Notes  Heart Rate and Heartbeat Questions Age > 60 years (Exception: brief heart beat symptoms that went away and now feels well)    Final Disposition User   See Physician within 4 Hours (or PCP triage) Leilani Merl, RN, Nira Conn    Comments  Appt with Dr. Elease Hashimoto today at 1:45 pm.   Referrals  REFERRED TO PCP OFFICE   Disagree/Comply: Comply

## 2015-12-28 ENCOUNTER — Encounter: Payer: Self-pay | Admitting: Internal Medicine

## 2015-12-28 ENCOUNTER — Ambulatory Visit (INDEPENDENT_AMBULATORY_CARE_PROVIDER_SITE_OTHER): Payer: Medicare Other | Admitting: Internal Medicine

## 2015-12-28 ENCOUNTER — Encounter: Payer: Self-pay | Admitting: Family Medicine

## 2015-12-28 VITALS — BP 92/60 | HR 82 | Resp 20 | Ht 61.0 in | Wt 142.0 lb

## 2015-12-28 DIAGNOSIS — Z8546 Personal history of malignant neoplasm of prostate: Secondary | ICD-10-CM

## 2015-12-28 DIAGNOSIS — Z0000 Encounter for general adult medical examination without abnormal findings: Secondary | ICD-10-CM

## 2015-12-28 DIAGNOSIS — E785 Hyperlipidemia, unspecified: Secondary | ICD-10-CM

## 2015-12-28 DIAGNOSIS — D708 Other neutropenia: Secondary | ICD-10-CM

## 2015-12-28 DIAGNOSIS — Z23 Encounter for immunization: Secondary | ICD-10-CM

## 2015-12-28 DIAGNOSIS — C61 Malignant neoplasm of prostate: Secondary | ICD-10-CM

## 2015-12-28 LAB — COMPREHENSIVE METABOLIC PANEL
ALK PHOS: 74 U/L (ref 39–117)
ALT: 12 U/L (ref 0–53)
AST: 15 U/L (ref 0–37)
Albumin: 4 g/dL (ref 3.5–5.2)
BUN: 22 mg/dL (ref 6–23)
CO2: 28 mEq/L (ref 19–32)
Calcium: 9.6 mg/dL (ref 8.4–10.5)
Chloride: 105 mEq/L (ref 96–112)
Creatinine, Ser: 1.46 mg/dL (ref 0.40–1.50)
GFR: 50.44 mL/min — AB (ref >60.00)
Glucose, Bld: 90 mg/dL (ref 70–99)
POTASSIUM: 4.2 meq/L (ref 3.5–5.1)
Sodium: 139 mEq/L (ref 135–145)
TOTAL PROTEIN: 7.1 g/dL (ref 6.0–8.3)
Total Bilirubin: 1 mg/dL (ref 0.2–1.2)

## 2015-12-28 LAB — CBC WITH DIFFERENTIAL/PLATELET
BASOS ABS: 0 10*3/uL (ref 0.0–0.1)
Basophils Relative: 0.7 % (ref 0.0–3.0)
EOS ABS: 0 10*3/uL (ref 0.0–0.7)
Eosinophils Relative: 0.5 % (ref 0.0–5.0)
HCT: 24 % — ABNORMAL LOW (ref 39.0–52.0)
Hemoglobin: 7.8 g/dL — CL (ref 13.0–17.0)
LYMPHS ABS: 0.5 10*3/uL — AB (ref 0.7–4.0)
Lymphocytes Relative: 13.8 % (ref 12.0–46.0)
MCHC: 32.4 g/dL (ref 30.0–36.0)
MCV: 122.9 fl — ABNORMAL HIGH (ref 78.0–100.0)
Monocytes Absolute: 0.7 10*3/uL (ref 0.1–1.0)
Monocytes Relative: 18.9 % — ABNORMAL HIGH (ref 3.0–12.0)
NEUTROS ABS: 2.1 10*3/uL (ref 1.4–7.7)
NEUTROS PCT: 66.6 % (ref 43.0–77.0)
PLATELETS: 185 10*3/uL (ref 150.0–400.0)
RBC: 1.95 Mil/uL — ABNORMAL LOW (ref 4.22–5.81)
RDW: 24.7 % — ABNORMAL HIGH (ref 11.5–15.5)
WBC: 3.4 10*3/uL — ABNORMAL LOW (ref 4.0–10.5)

## 2015-12-28 LAB — LIPID PANEL
CHOLESTEROL: 102 mg/dL (ref 0–200)
HDL: 43.3 mg/dL (ref >39.00)
LDL Cholesterol: 43 mg/dL (ref 0–99)
NonHDL: 58.46
Total CHOL/HDL Ratio: 2
Triglycerides: 75 mg/dL (ref 0.0–149.0)
VLDL: 15 mg/dL (ref 0.0–40.0)

## 2015-12-28 LAB — TSH: TSH: 3.13 u[IU]/mL (ref 0.35–4.50)

## 2015-12-28 NOTE — Progress Notes (Addendum)
Subjective:    Patient ID: Reginald Daniels, male    DOB: [birth year], 70 y.o.   MRN: [id number]  HPI  70 year old patient who is seen today for a preventive health examination. He has a history of mental retardation and lives with a court appointed guardian.  His clinical status has been stable He has a history of prostate cancer and did have a PSA done recently and is scheduled for urology follow-up next week He is also scheduled for a annual eye examination No concerns or complaints.  He takes no chronic medications  Past Medical History:  Diagnosis Date  . Cancer [facility])    prostate  . Leukopenia   . Macrocytosis   . Mental retardation   . Thrombocytopenia [facility])      Social History   Social History  . Marital status: Married    Spouse name: N/A  . Number of children: N/A  . Years of education: N/A   Occupational History  . Not on file.   Social History Main Topics  . Smoking status: Never Smoker  . Smokeless tobacco: Never Used  . Alcohol use No  . Drug use: No  . Sexual activity: Not on file   Other Topics Concern  . Not on file   Social History Narrative  . No narrative on file    No past surgical history on file.  No family history on file.  No Known Allergies  Current Outpatient Prescriptions on File Prior to Visit  Medication Sig Dispense Refill  . diphenhydrAMINE (BENADRYL ALLERGY) 25 mg capsule Take 50 mg by mouth every 6 (six) hours as needed.    . Multiple Vitamin (MULTIVITAMIN) tablet Take 1 tablet by mouth daily.       No current facility-administered medications on file prior to visit.     BP 92/60 (BP Location: Left Arm, Patient Position: Sitting, Cuff Size: Normal)   Pulse 82   Resp 20   Ht _0  (1.549 m)   Wt 142 lb (64.4 kg)   BMI 26.83 kg/m    Medicare wellness  1. Risk factors, based on past  M,S,F history.  No cardiovascular risk factors  2.  Physical activities: Walks daily.  No exercise restrictions   3.   Depression/mood: No history depression or mood disorder as a history of moderate cognitive impairment  4.  Hearing: No deficits  5.  ADL's: Requires assistance in all aspects of daily living.  Due to cognitive impairment  6.  Fall risk: Low to moderate  7.  Home safety: No problems identified.  Lives with his court appointed guardian  8.  Height weight, and visual acuity; height and weight stable.  There has been some concern of recent visual loss.  Is scheduled to see ophthalmology soon  9.  Counseling: Heart healthy diet recommended more regular exercise  10. Lab orders based on risk factors: We'll check updated lab including lipid profile  11. Referral : Ophthalmology and urology  12. Care plan: Continue annual exams with screening lab  13. Cognitive assessment: History of moderate cognitive impairment  14. Screening: Patient provided with a written and personalized 5-10 year screening schedule in the AVS.    15. Provider List Update: Primary care ophthalmology and urology    Review of Systems  Constitutional: Negative for appetite change, chills, fatigue and fever.  HENT: Negative for congestion, dental problem, ear pain, hearing loss, sore throat, tinnitus, trouble swallowing and voice change.   Eyes: Negative for pain,  discharge and visual disturbance.  Respiratory: Negative for cough, chest tightness, wheezing and stridor.   Cardiovascular: Negative for chest pain, palpitations and leg swelling.  Gastrointestinal: Negative for abdominal distention, abdominal pain, blood in stool, constipation, diarrhea, nausea and vomiting.  Genitourinary: Negative for difficulty urinating, discharge, flank pain, genital sores, hematuria and urgency.  Musculoskeletal: Negative for arthralgias, back pain, gait problem, joint swelling, myalgias and neck stiffness.  Skin: Negative for rash.  Neurological: Negative for dizziness, syncope, speech difficulty, weakness, numbness and headaches.    Hematological: Negative for adenopathy. Does not bruise/bleed easily.  Psychiatric/Behavioral: Negative for behavioral problems and dysphoric mood. The patient is not nervous/anxious.        Objective:   Physical Exam  Constitutional: He appears well-developed and well-nourished.  Blood pressure low normal Anxious Difficult to relax and cooperate for the examination Short stature with some dysmorphic features  HENT:  Head: Normocephalic and atraumatic.  Right Ear: External ear normal.  Left Ear: External ear normal.  Nose: Nose normal.  Mouth/Throat: Oropharynx is clear and moist.  Poor dental hygiene  Eyes: Conjunctivae and EOM are normal. Pupils are equal, round, and reactive to light. No scleral icterus.  Neck: Normal range of motion. Neck supple. No JVD present. No thyromegaly present.  Cardiovascular: Regular rhythm, normal heart sounds and intact distal pulses.  Exam reveals no gallop and no friction rub.   No murmur heard. Pulmonary/Chest: Effort normal and breath sounds normal. He exhibits no tenderness.  Abdominal: Soft. Bowel sounds are normal. He exhibits no distension and no mass. There is no tenderness.  Genitourinary: Penis normal.  Musculoskeletal: Normal range of motion. He exhibits edema. He exhibits no tenderness.  Lymphadenopathy:    He has no cervical adenopathy.  Neurological: He is alert. He has normal reflexes. No cranial nerve deficit. Coordination normal.  Skin: Skin is warm and dry. No rash noted.  Psychiatric:  Cooperative, anxious.  No distress Moderate cognitive impairment          Assessment & Plan:   Preventive health care.  Follow-up urology and ophthalmology as planned.  Will review screening lab.  Patient has a history of stable macrocytic anemia and leukopenia Immunizations updated History of prostate cancer.  Recent PSA checked by urology Cognitive impairment.  More regular and vigorous exercise regimen encouraged Follow-up one  year or as needed  [name]   Addendum: 12/29/15.  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 B12 and folate levels have been normal over the years. The patient has dysmorphic features consistent with Down syndrome, but unclear whether this diagnosis has ever been made or confirmed with genetic testing.  Suspect this is the most likely etiology of his megaloblastic anemia. Patient's TSH and LDH levels are normal  We'll check a reticulocyte count.  Repeat B12 and folate levels and also check a copper level.  We'll also perform genetic testing for Down syndrome  Hematologist referral.  Likely will need bone marrow biopsy and aspiration to rule out myelodysplastic syndrome  [name]

## 2015-12-28 NOTE — Patient Instructions (Addendum)
It is important that you exercise regularly, at least 20 minutes 3 to 4 times per week.  If you develop chest pain or shortness of breath seek  medical attention.  Urology follow-up as scheduled  Ophthalmology follow-up as scheduled  Return here in one year or as needed    Health Maintenance, Male A healthy lifestyle and preventative care can promote health and wellness.  Maintain regular health, dental, and eye exams.  Eat a healthy diet. Foods like vegetables, fruits, whole grains, low-fat dairy products, and lean protein foods contain the nutrients you need and are low in calories. Decrease your intake of foods high in solid fats, added sugars, and salt. Get information about a proper diet from your health care provider, if necessary.  Regular physical exercise is one of the most important things you can do for your health. Most adults should get at least 150 minutes of moderate-intensity exercise (any activity that increases your heart rate and causes you to sweat) each week. In addition, most adults need muscle-strengthening exercises on 2 or more days a week.   Maintain a healthy weight. The body mass index (BMI) is a screening tool to identify possible weight problems. It provides an estimate of body fat based on height and weight. Your health care provider can find your BMI and can help you achieve or maintain a healthy weight. For males 20 years and older:  A BMI below 18.5 is considered underweight.  A BMI of 18.5 to 24.9 is normal.  A BMI of 25 to 29.9 is considered overweight.  A BMI of 30 and above is considered obese.  Maintain normal blood lipids and cholesterol by exercising and minimizing your intake of saturated fat. Eat a balanced diet with plenty of fruits and vegetables. Blood tests for lipids and cholesterol should begin at age 65 and be repeated every 5 years. If your lipid or cholesterol levels are high, you are over age 19, or you are at high risk for heart  disease, you may need your cholesterol levels checked more frequently.Ongoing high lipid and cholesterol levels should be treated with medicines if diet and exercise are not working.  If you smoke, find out from your health care provider how to quit. If you do not use tobacco, do not start.  Lung cancer screening is recommended for adults aged 68-80 years who are at high risk for developing lung cancer because of a history of smoking. A yearly low-dose CT scan of the lungs is recommended for people who have at least a 30-pack-year history of smoking and are current smokers or have quit within the past 15 years. A pack year of smoking is smoking an average of 1 pack of cigarettes a day for 1 year (for example, a 30-pack-year history of smoking could mean smoking 1 pack a day for 30 years or 2 packs a day for 15 years). Yearly screening should continue until the smoker has stopped smoking for at least 15 years. Yearly screening should be stopped for people who develop a health problem that would prevent them from having lung cancer treatment.  If you choose to drink alcohol, do not have more than 2 drinks per day. One drink is considered to be 12 oz (360 mL) of beer, 5 oz (150 mL) of wine, or 1.5 oz (45 mL) of liquor.  Avoid the use of street drugs. Do not share needles with anyone. Ask for help if you need support or instructions about stopping the use of  drugs.  High blood pressure causes heart disease and increases the risk of stroke. High blood pressure is more likely to develop in:  People who have blood pressure in the end of the normal range (100-139/85-89 mm Hg).  People who are overweight or obese.  People who are African American.  If you are 15-7 years of age, have your blood pressure checked every 3-5 years. If you are 75 years of age or older, have your blood pressure checked every year. You should have your blood pressure measured twice--once when you are at a hospital or clinic, and  once when you are not at a hospital or clinic. Record the average of the two measurements. To check your blood pressure when you are not at a hospital or clinic, you can use:  An automated blood pressure machine at a pharmacy.  A home blood pressure monitor.  If you are 25-71 years old, ask your health care provider if you should take aspirin to prevent heart disease.  Diabetes screening involves taking a blood sample to check your fasting blood sugar level. This should be done once every 3 years after age 72 if you are at a normal weight and without risk factors for diabetes. Testing should be considered at a younger age or be carried out more frequently if you are overweight and have at least 1 risk factor for diabetes.  Colorectal cancer can be detected and often prevented. Most routine colorectal cancer screening begins at the age of 7 and continues through age 96. However, your health care provider may recommend screening at an earlier age if you have risk factors for colon cancer. On a yearly basis, your health care provider may provide home test kits to check for hidden blood in the stool. A small camera at the end of a tube may be used to directly examine the colon (sigmoidoscopy or colonoscopy) to detect the earliest forms of colorectal cancer. Talk to your health care provider about this at age 66 when routine screening begins. A direct exam of the colon should be repeated every 5-10 years through age 87, unless early forms of precancerous polyps or small growths are found.  People who are at an increased risk for hepatitis B should be screened for this virus. You are considered at high risk for hepatitis B if:  You were born in a country where hepatitis B occurs often. Talk with your health care provider about which countries are considered high risk.  Your parents were born in a high-risk country and you have not received a shot to protect against hepatitis B (hepatitis B  vaccine).  You have HIV or AIDS.  You use needles to inject street drugs.  You live with, or have sex with, someone who has hepatitis B.  You are a man who has sex with other men (MSM).  You get hemodialysis treatment.  You take certain medicines for conditions like cancer, organ transplantation, and autoimmune conditions.  Hepatitis C blood testing is recommended for all people born from [birth year] through 1967 and any individual with known risk factors for hepatitis C.  Healthy men should no longer receive prostate-specific antigen (PSA) blood tests as part of routine cancer screening. Talk to your health care provider about prostate cancer screening.  Testicular cancer screening is not recommended for adolescents or adult males who have no symptoms. Screening includes self-exam, a health care provider exam, and other screening tests. Consult with your health care provider about any symptoms you have  or any concerns you have about testicular cancer.  Practice safe sex. Use condoms and avoid high-risk sexual practices to reduce the spread of sexually transmitted infections (STIs).  You should be screened for STIs, including gonorrhea and chlamydia if:  You are sexually active and are younger than 24 years.  You are older than 24 years, and your health care provider tells you that you are at risk for this type of infection.  Your sexual activity has changed since you were last screened, and you are at an increased risk for chlamydia or gonorrhea. Ask your health care provider if you are at risk.  If you are at risk of being infected with HIV, it is recommended that you take a prescription medicine daily to prevent HIV infection. This is called pre-exposure prophylaxis (PrEP). You are considered at risk if:  You are a man who has sex with other men (MSM).  You are a heterosexual man who is sexually active with multiple partners.  You take drugs by injection.  You are sexually active  with a partner who has HIV.  Talk with your health care provider about whether you are at high risk of being infected with HIV. If you choose to begin PrEP, you should first be tested for HIV. You should then be tested every 3 months for as long as you are taking PrEP.  Use sunscreen. Apply sunscreen liberally and repeatedly throughout the day. You should seek shade when your shadow is shorter than you. Protect yourself by wearing long sleeves, pants, a wide-brimmed hat, and sunglasses year round whenever you are outdoors.  Tell your health care provider of new moles or changes in moles, especially if there is a change in shape or color. Also, tell your health care provider if a mole is larger than the size of a pencil eraser.  A one-time screening for abdominal aortic aneurysm (AAA) and surgical repair of large AAAs by ultrasound is recommended for men aged 8-75 years who are current or former smokers.  Stay current with your vaccines (immunizations).   This information is not intended to replace advice given to you by your health care provider. Make sure you discuss any questions you have with your health care provider.   Document Released: 08/04/2007 Document Revised: 02/26/2014 Document Reviewed: 07/03/2010 Elsevier Interactive Patient Education 2016 [place] have about not sure about that he is so so.  He said he would be done wheezing transfer is consistently hopefully will be immediate don't feel that her incisions, and his reticulocyte exam . and relax, place.  He is yet and also yet available this afternoon time.  After safe 1:00 1:30 2:00 week by Dellis Filbert

## 2015-12-28 NOTE — Progress Notes (Signed)
Pre visit review using our clinic review tool, if applicable. No additional management support is needed unless otherwise documented below in the visit note. 

## 2015-12-28 NOTE — Progress Notes (Signed)
Received note from lab for critical lab result of hemoglobin 7.8. I attempted to call patient at phone number given which was [phone] and unable to reach..  Will forward to Dr Raliegh Ip, his primary.  Was seen today for wellness exam.

## 2015-12-29 ENCOUNTER — Other Ambulatory Visit: Payer: Self-pay | Admitting: Internal Medicine

## 2015-12-29 DIAGNOSIS — D531 Other megaloblastic anemias, not elsewhere classified: Secondary | ICD-10-CM

## 2015-12-29 NOTE — Progress Notes (Signed)
Attempted to call patient's guardian, [name]  [phone]) to discuss patient's anemia. His significant other, Elray Mcgregor, was this patient's court appointed guardian who is now deceased. Mr. Domingo Cocking lives with the patient and now is his guardian (unsure if this is by simple verbal agreement with Ms.Autumn Patty , or whether this is a court appointed guardianship). We'll attempt to contact to discuss referral for hematologic evaluation

## 2016-01-03 DIAGNOSIS — C61 Malignant neoplasm of prostate: Secondary | ICD-10-CM | POA: Diagnosis not present

## 2016-01-04 ENCOUNTER — Encounter: Payer: Self-pay | Admitting: Hematology

## 2016-01-04 ENCOUNTER — Telehealth: Payer: Self-pay | Admitting: Hematology

## 2016-01-04 NOTE — Telephone Encounter (Signed)
Pt's son cld to schedule appt w/Kale. Appt has been scheduled for 11/27 at 1pm. Agreed to appt date and time. Letter mailed to the pt.

## 2016-01-16 ENCOUNTER — Telehealth: Payer: Self-pay | Admitting: Hematology

## 2016-01-16 ENCOUNTER — Ambulatory Visit (HOSPITAL_BASED_OUTPATIENT_CLINIC_OR_DEPARTMENT_OTHER): Payer: Medicare Other

## 2016-01-16 ENCOUNTER — Ambulatory Visit (HOSPITAL_BASED_OUTPATIENT_CLINIC_OR_DEPARTMENT_OTHER): Payer: Medicare Other | Admitting: Hematology

## 2016-01-16 VITALS — BP 107/48 | HR 82 | Temp 97.3°F | Resp 18 | Ht 61.0 in | Wt 142.8 lb

## 2016-01-16 DIAGNOSIS — C61 Malignant neoplasm of prostate: Secondary | ICD-10-CM

## 2016-01-16 DIAGNOSIS — D539 Nutritional anemia, unspecified: Secondary | ICD-10-CM

## 2016-01-16 DIAGNOSIS — D72819 Decreased white blood cell count, unspecified: Secondary | ICD-10-CM | POA: Diagnosis not present

## 2016-01-16 DIAGNOSIS — F79 Unspecified intellectual disabilities: Secondary | ICD-10-CM | POA: Diagnosis not present

## 2016-01-16 LAB — COMPREHENSIVE METABOLIC PANEL
ALBUMIN: 3.5 g/dL (ref 3.5–5.0)
ALK PHOS: 74 U/L (ref 40–150)
ALT: 12 U/L (ref 0–55)
ANION GAP: 9 meq/L (ref 3–11)
AST: 17 U/L (ref 5–34)
BILIRUBIN TOTAL: 0.61 mg/dL (ref 0.20–1.20)
BUN: 20 mg/dL (ref 7.0–26.0)
CALCIUM: 9.7 mg/dL (ref 8.4–10.4)
CO2: 23 mEq/L (ref 22–29)
Chloride: 111 mEq/L — ABNORMAL HIGH (ref 98–109)
Creatinine: 1.4 mg/dL — ABNORMAL HIGH (ref 0.7–1.3)
EGFR: 49 mL/min/{1.73_m2} — AB (ref >90)
Glucose: 90 mg/dl (ref 70–140)
Potassium: 4.2 mEq/L (ref 3.5–5.1)
Sodium: 142 mEq/L (ref 136–145)
TOTAL PROTEIN: 7.4 g/dL (ref 6.4–8.3)

## 2016-01-16 LAB — CBC & DIFF AND RETIC
BASO%: 0.3 % (ref 0.0–2.0)
Basophils Absolute: 0 10*3/uL (ref 0.0–0.1)
EOS ABS: 0 10*3/uL (ref 0.0–0.5)
EOS%: 0.3 % (ref 0.0–7.0)
HCT: 25 % — ABNORMAL LOW (ref 38.4–49.9)
HEMOGLOBIN: 8 g/dL — AB (ref 13.0–17.1)
IMMATURE RETIC FRACT: 14.6 % — AB (ref 3.00–10.60)
LYMPH%: 13.7 % — AB (ref 14.0–49.0)
MCH: 40.4 pg — AB (ref 27.2–33.4)
MCHC: 32 g/dL (ref 32.0–36.0)
MCV: 126.3 fL — AB (ref 79.3–98.0)
MONO#: 0.5 10*3/uL (ref 0.1–0.9)
MONO%: 14.9 % — AB (ref 0.0–14.0)
NEUT#: 2.3 10*3/uL (ref 1.5–6.5)
NEUT%: 70.8 % (ref 39.0–75.0)
PLATELETS: 149 10*3/uL (ref 140–400)
RBC: 1.98 10*6/uL — ABNORMAL LOW (ref 4.20–5.82)
RDW: 20.7 % — ABNORMAL HIGH (ref 11.0–14.6)
Retic %: 4.59 % — ABNORMAL HIGH (ref 0.80–1.80)
Retic Ct Abs: 90.88 10*3/uL (ref 34.80–93.90)
WBC: 3.3 10*3/uL — ABNORMAL LOW (ref 4.0–10.3)
lymph#: 0.5 10*3/uL — ABNORMAL LOW (ref 0.9–3.3)

## 2016-01-16 LAB — TSH: TSH: 2.368 m(IU)/L (ref 0.320–4.118)

## 2016-01-16 LAB — CHCC SMEAR

## 2016-01-16 LAB — LACTATE DEHYDROGENASE: LDH: 208 U/L (ref 125–245)

## 2016-01-16 NOTE — Telephone Encounter (Signed)
Appointments scheduled per 11/27 LOS. Patient given AVS report and calendars with future scheduled appointments. °

## 2016-01-16 NOTE — Progress Notes (Signed)
[name]    HEMATOLOGY/ONCOLOGY CONSULTATION NOTE  Date of Service: 01/16/2016  Patient Care Team: Marletta Lor, MD as PCP - General  CHIEF COMPLAINTS/PURPOSE OF CONSULTATION:  Macrocytic anemia  HISTORY OF PRESENTING ILLNESS:   Reginald Daniels is a wonderful 70 y.o. male who has been referred to [place] by Dr .[name], MD for evaluation and management of macrocytic anemia.  Patient has a h/o mental retardation (has a legal guardia -Reginald Daniels who was present for visit), prostate cancer (managed by urology, s/p brachy RT with seeds), chronic macrocytosis with anemia and leukopenia.  He recently on 12/28/2015 has labs with his PCP including CBC which showed severe anemia with hgb of 7.8 MCV 123, nl PLT 185k, mild leucopenia 3.4k.  He has had significant macrocytosis since atleast 2013 but possibly longer/lifelong. However previously had hgb of 12.1 in 02/2013 and 13.2 in 05/2011. He has had wbc count of 2.7-3.4k in this time period.  Patient is a limited historian. Notes no significant dizziness or lightheadedness. Some fatigue. No weight loss. Night sweats. No bone pains. Denies any other new urinary symptoms. He is not reporting any other focal symptoms. He is legal guardian does not suggest any other complaints.    MEDICAL HISTORY:  Past Medical History:  Diagnosis Date  . Cancer [facility])    prostate  . Leukopenia   . Macrocytosis   . Mental retardation   . Thrombocytopenia ([place])     SURGICAL HISTORY: status post seed implantation for prostate cancer  bilateral inguinal herniorrhaphies   SOCIAL HISTORY: Social History   Social History  . Marital status: Married    Spouse name: N/A  . Number of children: N/A  . Years of education: N/A   Occupational History  . Not on file.   Social History Main Topics  . Smoking status: Never Smoker  . Smokeless tobacco: Never Used  . Alcohol use No  . Drug use: No  . Sexual activity: Not on file   Other  Topics Concern  . Not on file   Social History Narrative  . No narrative on file  Never Smoked  patient has never been employed due to mental retardation  he lives with a court appointed guardian - Reginald Daniels Smoking Status: never  FAMILY HISTORY:  father died at age 56  mother died young. A suicide death  No siblings   ALLERGIES:  has No Known Allergies.  MEDICATIONS:  Current Outpatient Prescriptions  Medication Sig Dispense Refill  . diphenhydrAMINE (BENADRYL ALLERGY) 25 mg capsule Take 50 mg by mouth every 6 (six) hours as needed.    . Multiple Vitamin (MULTIVITAMIN) tablet Take 1 tablet by mouth daily.       No current facility-administered medications for this visit.     REVIEW OF SYSTEMS:    10 Point review of Systems was done is negative except as noted above.  PHYSICAL EXAMINATION: ECOG PERFORMANCE STATUS: 1 - Symptomatic but completely ambulatory  . Vitals:   01/16/16 1320  BP: (!) 107/48  Pulse: 82  Resp: 18  Temp: 97.3 F (36.3 C)   Filed Weights   01/16/16 1320  Weight: 142 lb 12.8 oz (64.8 kg)   .Body mass index is 26.98 kg/m.  GENERAL:alert, in no acute distress and comfortable, appears pale SKIN: no acute rashes EYES: Conjunctivae with pallor no scleral icterus OROPHARYNX:no exudate, no erythema and lips, buccal mucosa, and tongue normal  NECK: supple, no JVD, thyroid normal size, non-tender, without nodularity LYMPH:  no palpable lymphadenopathy in the cervical, axillary or inguinal LUNGS: clear to auscultation with normal respiratory effort HEART: regular rate & rhythm,  no murmurs and no lower extremity edema ABDOMEN: abdomen soft, non-tender, normoactive bowel sounds  Musculoskeletal: no cyanosis of digits and no clubbing  PSYCH: alert , significant cognitive issues due to h/o mental retardation NEURO: no focal motor/sensory deficits  LABORATORY DATA:  I have reviewed the data as listed  . CBC Latest Ref Rng & Units 12/28/2015  02/24/2013 05/29/2011  WBC 4.0 - 10.5 K/uL 3.4(L) 2.7(L) 3.1(L)  Hemoglobin 13.0 - 17.0 g/dL 7.8 Repeated and verified X2.(LL) 12.1(L) 13.2  Hematocrit 39.0 - 52.0 % 24.0 Repeated and verified X2.(L) 35.6(L) 38.7(L)  Platelets 150.0 - 400.0 K/uL 185.0 154.0 139.0(L)   . CBC    Component Value Date/Time   WBC 3.4 (L) 12/28/2015 1019   RBC 1.95 (L) 12/28/2015 1019   HGB 7.8 Repeated and verified X2. (LL) 12/28/2015 1019   HCT 24.0 Repeated and verified X2. (L) 12/28/2015 1019   PLT 185.0 12/28/2015 1019   MCV 122.9 (H) 12/28/2015 1019   MCHC 32.4 12/28/2015 1019   RDW 24.7 (H) 12/28/2015 1019   LYMPHSABS 0.5 (L) 12/28/2015 1019   MONOABS 0.7 12/28/2015 1019   EOSABS 0.0 12/28/2015 1019   BASOSABS 0.0 12/28/2015 1019    . CMP Latest Ref Rng & Units 12/28/2015 02/24/2013 05/29/2011  Glucose 70 - 99 mg/dL 90 80 83  BUN 6 - 23 mg/dL 22 16 19   Creatinine 0.40 - 1.50 mg/dL 1.46 1.2 1.1  Sodium 135 - 145 mEq/L 139 139 144  Potassium 3.5 - 5.1 mEq/L 4.2 4.0 4.6  Chloride 96 - 112 mEq/L 105 104 106  CO2 19 - 32 mEq/L 28 28 28   Calcium 8.4 - 10.5 mg/dL 9.6 9.8 9.8  Total Protein 6.0 - 8.3 g/dL 7.1 6.8 7.1  Total Bilirubin 0.2 - 1.2 mg/dL 1.0 0.9 0.8  Alkaline Phos 39 - 117 U/L 74 55 54  AST 0 - 37 U/L 15 20 22   ALT 0 - 53 U/L 12 17 21    Component     Latest Ref Rng & Units 01/16/2016  WBC     4.0 - 10.3 10e3/uL 3.3 (L)  NEUT#     1.5 - 6.5 10e3/uL 2.3  Hemoglobin     13.0 - 17.1 g/dL 8.0 (L)  HCT     38.4 - 49.9 % 25.0 (L)  Platelets     140 - 400 10e3/uL 149  MCV     79.3 - 98.0 fL 126.3 (H)  MCH     27.2 - 33.4 pg 40.4 (H)  MCHC     32.0 - 36.0 g/dL 32.0  RBC     4.20 - 5.82 10e6/uL 1.98 (L)  RDW     11.0 - 14.6 % 20.7 (H)  lymph#     0.9 - 3.3 10e3/uL 0.5 (L)  MONO#     0.1 - 0.9 10e3/uL 0.5  Eosinophils Absolute     0.0 - 0.5 10e3/uL 0.0  Basophils Absolute     0.0 - 0.1 10e3/uL 0.0  NEUT%     39.0 - 75.0 % 70.8  LYMPH%     14.0 - 49.0 % 13.7 (L)  MONO%      0.0 - 14.0 % 14.9 (H)  EOS%     0.0 - 7.0 % 0.3  BASO%     0.0 - 2.0 % 0.3  Retic %  0.80 - 1.80 % 4.59 (H)  Retic Ct Abs     34.80 - 93.90 10e3/uL 90.88  Immature Retic Fract     3.00 - 10.60 % 14.60 (H)  Sodium     136 - 145 mEq/L 142  Potassium     3.5 - 5.1 mEq/L 4.2  Chloride     98 - 109 mEq/L 111 (H)  CO2     22 - 29 mEq/L 23  Glucose     70 - 140 mg/dl 90  BUN     7.0 - 26.0 mg/dL 20.0  Creatinine     0.7 - 1.3 mg/dL 1.4 (H)  Total Bilirubin     0.20 - 1.20 mg/dL 0.61  Alkaline Phosphatase     40 - 150 U/L 74  AST     5 - 34 U/L 17  ALT     0 - 55 U/L 12  Total Protein     6.4 - 8.3 g/dL 7.4  Albumin     3.5 - 5.0 g/dL 3.5  Calcium     8.4 - 10.4 mg/dL 9.7  Anion gap     3 - 11 mEq/L 9  EGFR     >90 ml/min/1.73 m2 49 (L)    RADIOGRAPHIC STUDIES: I have personally reviewed the radiological images as listed and agreed with the findings in the report. No results found.  ASSESSMENT & PLAN:   70 year old very pleasant gentleman with mental retardation here with his legal guardian for evaluation of   1) Severe Macrocytic Anemia  Hemoglobin of 7.8 on repeat today is 8 with an MCV of 126. Patient has limited symptoms at this time. PLAN -Patient is not overtly symptomatic at this time and we shall hold off on PRBC transfusion currently and evaluate for easily reversible causes -Discussed with the legal guardian the possible etiologies and need for lab workup and informed consent was obtained for these. -Rule out hemolysis  -Check H47, RBC folate, folic acid  -Myeloma panel  -TSH   peripheral blood smear   other workup as per orders noted below . Orders Placed This Encounter  Procedures  . CBC & Diff and Retic    Standing Status:   Future    Number of Occurrences:   1    Standing Expiration Date:   01/15/2017  . Comprehensive metabolic panel    Standing Status:   Future    Number of Occurrences:   1    Standing Expiration Date:   01/15/2017    . Smear    Standing Status:   Future    Number of Occurrences:   1    Standing Expiration Date:   01/15/2017  . Lactate dehydrogenase    Standing Status:   Future    Number of Occurrences:   1    Standing Expiration Date:   01/15/2017  . Haptoglobin    Standing Status:   Future    Number of Occurrences:   1    Standing Expiration Date:   01/15/2017  . Multiple Myeloma Panel (SPEP&IFE w/QIG)    Standing Status:   Future    Number of Occurrences:   1    Standing Expiration Date:   01/15/2017  . Kappa/lambda light chains    Standing Status:   Future    Number of Occurrences:   1    Standing Expiration Date:   02/19/2017  . Vitamin B12    Standing Status:   Future  Number of Occurrences:   1    Standing Expiration Date:   01/15/2017  . Folate RBC    Standing Status:   Future    Number of Occurrences:   1    Standing Expiration Date:   01/15/2017  . Folate, Serum    Standing Status:   Future    Number of Occurrences:   1    Standing Expiration Date:   01/15/2017  . Vitamin B1    Standing Status:   Future    Number of Occurrences:   1    Standing Expiration Date:   01/15/2017  . Cold agglutinin titer    Standing Status:   Future    Number of Occurrences:   1    Standing Expiration Date:   01/15/2017  . TSH    Standing Status:   Future    Number of Occurrences:   1    Standing Expiration Date:   01/15/2017  . Type and screen    Standing Status:   Future    Standing Expiration Date:   01/15/2017    -His legal guardian was recommended to call us immediately or taken to the emergency room for any symptoms suggestive of worsening symptoms from his anemia since that may be an indication for a blood transfusion.  Labs today RTC with Dr Irene Limbo in 2 weeks with rpt labs   All of the patients questions were answered with apparent satisfaction. The patient knows to call the clinic with any problems, questions or concerns.  I spent 45 minutes counseling the patient face to face.  The total time spent in the appointment was 60 minutes and more than 50% was on counseling and direct patient cares.    Sullivan Lone MD [place] AAHIVMS [facility] Hematology/Oncology Physician [facility]  (Office):       [phone] (Work cell):  [phone] (Fax):           [phone]  01/16/2016 1:25 PM

## 2016-01-17 LAB — MULTIPLE MYELOMA PANEL, SERUM
ALBUMIN SERPL ELPH-MCNC: 3.3 g/dL (ref 2.9–4.4)
ALPHA2 GLOB SERPL ELPH-MCNC: 0.7 g/dL (ref 0.4–1.0)
Albumin/Glob SerPl: 1 (ref 0.7–1.7)
Alpha 1: 0.3 g/dL (ref 0.0–0.4)
B-GLOBULIN SERPL ELPH-MCNC: 0.9 g/dL (ref 0.7–1.3)
GAMMA GLOB SERPL ELPH-MCNC: 1.5 g/dL (ref 0.4–1.8)
GLOBULIN, TOTAL: 3.4 g/dL (ref 2.2–3.9)
IgA, Qn, Serum: 254 mg/dL (ref 61–437)
IgG, Qn, Serum: 1425 mg/dL (ref 700–1600)
IgM, Qn, Serum: 28 mg/dL (ref 15–143)
Total Protein: 6.7 g/dL (ref 6.0–8.5)

## 2016-01-17 LAB — FOLATE RBC
Folate, Hemolysate: 547.7 ng/mL
Hematocrit: 24.9 % — ABNORMAL LOW (ref 37.5–51.0)

## 2016-01-17 LAB — KAPPA/LAMBDA LIGHT CHAINS
Ig Kappa Free Light Chain: 45 mg/L — ABNORMAL HIGH (ref 3.3–19.4)
Ig Lambda Free Light Chain: 31.1 mg/L — ABNORMAL HIGH (ref 5.7–26.3)
KAPPA/LAMBDA FLC RATIO: 1.45 (ref 0.26–1.65)

## 2016-01-17 LAB — HAPTOGLOBIN: HAPTOGLOBIN: 113 mg/dL (ref 34–200)

## 2016-01-17 LAB — VITAMIN B12: Vitamin B12: 496 pg/mL (ref 211–946)

## 2016-01-17 LAB — COLD AGGLUTININ TITER: COLD AGGLUTININ TITER: NEGATIVE (ref <1:32)

## 2016-01-17 LAB — FOLATE: Folate: >20 ng/mL (ref >3.0)

## 2016-01-18 LAB — VITAMIN B1: Thiamine: 109.7 nmol/L (ref 66.5–200.0)

## 2016-02-02 ENCOUNTER — Encounter: Payer: Self-pay | Admitting: Hematology

## 2016-02-02 ENCOUNTER — Ambulatory Visit (HOSPITAL_BASED_OUTPATIENT_CLINIC_OR_DEPARTMENT_OTHER): Payer: Medicare Other | Admitting: Hematology

## 2016-02-02 ENCOUNTER — Other Ambulatory Visit (HOSPITAL_BASED_OUTPATIENT_CLINIC_OR_DEPARTMENT_OTHER): Payer: Medicare Other

## 2016-02-02 ENCOUNTER — Other Ambulatory Visit: Payer: Self-pay | Admitting: *Deleted

## 2016-02-02 ENCOUNTER — Telehealth: Payer: Self-pay | Admitting: Hematology

## 2016-02-02 VITALS — BP 100/50 | HR 73 | Temp 98.0°F | Resp 18 | Ht 61.0 in | Wt 142.3 lb

## 2016-02-02 DIAGNOSIS — F79 Unspecified intellectual disabilities: Secondary | ICD-10-CM

## 2016-02-02 DIAGNOSIS — C61 Malignant neoplasm of prostate: Secondary | ICD-10-CM

## 2016-02-02 DIAGNOSIS — D539 Nutritional anemia, unspecified: Secondary | ICD-10-CM | POA: Diagnosis not present

## 2016-02-02 LAB — CBC & DIFF AND RETIC
BASO%: 0.5 % (ref 0.0–2.0)
BASOS ABS: 0 10*3/uL (ref 0.0–0.1)
EOS%: 0.5 % (ref 0.0–7.0)
Eosinophils Absolute: 0 10*3/uL (ref 0.0–0.5)
HCT: 25.9 % — ABNORMAL LOW (ref 38.4–49.9)
HEMOGLOBIN: 8.4 g/dL — AB (ref 13.0–17.1)
Immature Retic Fract: 14.5 % — ABNORMAL HIGH (ref 3.00–10.60)
LYMPH#: 0.6 10*3/uL — AB (ref 0.9–3.3)
LYMPH%: 15 % (ref 14.0–49.0)
MCH: 40 pg — AB (ref 27.2–33.4)
MCHC: 32.4 g/dL (ref 32.0–36.0)
MCV: 123.3 fL — ABNORMAL HIGH (ref 79.3–98.0)
MONO#: 0.8 10*3/uL (ref 0.1–0.9)
MONO%: 19 % — ABNORMAL HIGH (ref 0.0–14.0)
NEUT#: 2.6 10*3/uL (ref 1.5–6.5)
NEUT%: 65 % (ref 39.0–75.0)
NRBC: 0 % (ref 0–0)
Platelets: 146 10*3/uL (ref 140–400)
RBC: 2.1 10*6/uL — ABNORMAL LOW (ref 4.20–5.82)
RDW: 19 % — AB (ref 11.0–14.6)
RETIC %: 4.11 % — AB (ref 0.80–1.80)
RETIC CT ABS: 86.31 10*3/uL (ref 34.80–93.90)
WBC: 4 10*3/uL (ref 4.0–10.3)

## 2016-02-02 LAB — COMPREHENSIVE METABOLIC PANEL
ALBUMIN: 3.5 g/dL (ref 3.5–5.0)
ALT: 12 U/L (ref 0–55)
AST: 14 U/L (ref 5–34)
Alkaline Phosphatase: 73 U/L (ref 40–150)
Anion Gap: 7 mEq/L (ref 3–11)
BUN: 15.3 mg/dL (ref 7.0–26.0)
CHLORIDE: 111 meq/L — AB (ref 98–109)
CO2: 22 mEq/L (ref 22–29)
CREATININE: 1.3 mg/dL (ref 0.7–1.3)
Calcium: 9.1 mg/dL (ref 8.4–10.4)
EGFR: 56 mL/min/{1.73_m2} — ABNORMAL LOW (ref >90)
GLUCOSE: 80 mg/dL (ref 70–140)
POTASSIUM: 4.1 meq/L (ref 3.5–5.1)
Sodium: 139 mEq/L (ref 136–145)
Total Bilirubin: 0.72 mg/dL (ref 0.20–1.20)
Total Protein: 7.3 g/dL (ref 6.4–8.3)

## 2016-02-02 NOTE — Telephone Encounter (Signed)
Appointments scheduled per 02/02/16 los. A copy of the appointment schedule and AVS report, was given to th e patient per 02/02/16 los.

## 2016-02-02 NOTE — Patient Instructions (Signed)
-  continue Multivitamin -add vit B complex 1 tab po daily

## 2016-03-01 ENCOUNTER — Telehealth: Payer: Self-pay | Admitting: Hematology

## 2016-03-01 ENCOUNTER — Other Ambulatory Visit (HOSPITAL_BASED_OUTPATIENT_CLINIC_OR_DEPARTMENT_OTHER): Payer: Medicare Other

## 2016-03-01 ENCOUNTER — Encounter: Payer: Self-pay | Admitting: Hematology

## 2016-03-01 ENCOUNTER — Ambulatory Visit (HOSPITAL_BASED_OUTPATIENT_CLINIC_OR_DEPARTMENT_OTHER): Payer: Medicare Other | Admitting: Hematology

## 2016-03-01 VITALS — BP 107/62 | HR 77 | Temp 97.7°F | Resp 17 | Ht 61.0 in | Wt 141.7 lb

## 2016-03-01 DIAGNOSIS — D539 Nutritional anemia, unspecified: Secondary | ICD-10-CM

## 2016-03-01 DIAGNOSIS — C61 Malignant neoplasm of prostate: Secondary | ICD-10-CM

## 2016-03-01 DIAGNOSIS — D72819 Decreased white blood cell count, unspecified: Secondary | ICD-10-CM

## 2016-03-01 DIAGNOSIS — F79 Unspecified intellectual disabilities: Secondary | ICD-10-CM

## 2016-03-01 LAB — CBC & DIFF AND RETIC
BASO%: 0.4 % (ref 0.0–2.0)
BASOS ABS: 0 10*3/uL (ref 0.0–0.1)
EOS ABS: 0.1 10*3/uL (ref 0.0–0.5)
EOS%: 1.1 % (ref 0.0–7.0)
HEMATOCRIT: 27.1 % — AB (ref 38.4–49.9)
HEMOGLOBIN: 8.9 g/dL — AB (ref 13.0–17.1)
IMMATURE RETIC FRACT: 12 % — AB (ref 3.00–10.60)
LYMPH%: 9.7 % — AB (ref 14.0–49.0)
MCH: 39.6 pg — AB (ref 27.2–33.4)
MCHC: 32.8 g/dL (ref 32.0–36.0)
MCV: 120.4 fL — ABNORMAL HIGH (ref 79.3–98.0)
MONO#: 0.8 10*3/uL (ref 0.1–0.9)
MONO%: 16.1 % — AB (ref 0.0–14.0)
NEUT#: 3.4 10*3/uL (ref 1.5–6.5)
NEUT%: 72.7 % (ref 39.0–75.0)
Platelets: 140 10*3/uL (ref 140–400)
RBC: 2.25 10*6/uL — ABNORMAL LOW (ref 4.20–5.82)
RDW: 17.9 % — ABNORMAL HIGH (ref 11.0–14.6)
RETIC %: 3.23 % — AB (ref 0.80–1.80)
Retic Ct Abs: 72.68 10*3/uL (ref 34.80–93.90)
WBC: 4.7 10*3/uL (ref 4.0–10.3)
lymph#: 0.5 10*3/uL — ABNORMAL LOW (ref 0.9–3.3)

## 2016-03-01 NOTE — Progress Notes (Signed)
Reginald Daniels Kitchen    HEMATOLOGY/ONCOLOGY CLINIC NOTE  Date of Service: .02/02/2016  Patient Care Team: Reginald Lor, MD as PCP - General  CHIEF COMPLAINTS/PURPOSE OF CONSULTATION:  Macrocytic anemia  HISTORY OF PRESENTING ILLNESS:   Reginald Daniels is a wonderful 71 y.o. male who has been referred to [place] by Dr .Reginald Cowden, MD for evaluation and management of macrocytic anemia.  Patient has a h/o mental retardation (has a legal guardia -Reginald Daniels who was present for visit), prostate cancer (managed by urology, s/p brachy RT with seeds), chronic macrocytosis with anemia and leukopenia.  He recently on 12/28/2015 has labs with his PCP including CBC which showed severe anemia with hgb of 7.8 MCV 123, nl PLT 185k, mild leucopenia 3.4k.  He has had significant macrocytosis since atleast 2013 but possibly longer/lifelong. However previously had hgb of 12.1 in 02/2013 and 13.2 in 05/2011. He has had wbc count of 2.7-3.4k in this time period.  Patient is a limited historian. Notes no significant dizziness or lightheadedness. Some fatigue. No weight loss. Night sweats. No bone pains. Denies any other new urinary symptoms. He is not reporting any other focal symptoms. He is legal guardian does not suggest any other complaints.   INTERVAL HISTORY  [name] is here for followup for his macrocytic anemia. His hemoglobin has improved to 8.4 with some decrease in his macrocytosis.  No acute new symptoms. Results of workup were discussed in detail with his legal guardian.   MEDICAL HISTORY:  Past Medical History:  Diagnosis Date  . Cancer [facility])    prostate  . Leukopenia   . Macrocytosis   . Mental retardation   . Thrombocytopenia ([place])     SURGICAL HISTORY: status post seed implantation for prostate cancer  bilateral inguinal herniorrhaphies   SOCIAL HISTORY: Social History   Social History  . Marital status: Married    Spouse name: N/A  . Number of children: N/A  .  Years of education: N/A   Occupational History  . Not on file.   Social History Main Topics  . Smoking status: Never Smoker  . Smokeless tobacco: Never Used  . Alcohol use No  . Drug use: No  . Sexual activity: Not on file   Other Topics Concern  . Not on file   Social History Narrative  . No narrative on file  Never Smoked  patient has never been employed due to mental retardation  he lives with a court appointed guardian - Reginald Daniels Smoking Status: never  FAMILY HISTORY:  father died at age 44  mother died young. A suicide death  No siblings   ALLERGIES:  has No Known Allergies.  MEDICATIONS:  Current Outpatient Prescriptions  Medication Sig Dispense Refill  . diphenhydrAMINE (BENADRYL ALLERGY) 25 mg capsule Take 50 mg by mouth every 6 (six) hours as needed.    . Multiple Vitamin (MULTIVITAMIN) tablet Take 1 tablet by mouth daily.       No current facility-administered medications for this visit.     REVIEW OF SYSTEMS:    10 Point review of Systems was done is negative except as noted above.  PHYSICAL EXAMINATION: ECOG PERFORMANCE STATUS: 1 - Symptomatic but completely ambulatory  . Vitals:   02/02/16 0928  BP: (!) 100/50  Pulse: 73  Resp: 18  Temp: 98 F (36.7 C)   Filed Weights   02/02/16 0928  Weight: 142 lb 4.8 oz (64.5 kg)   .Body mass index is 26.89 kg/m.  GENERAL:alert, in  no acute distress and comfortable, appears pale SKIN: no acute rashes EYES: Conjunctivae with pallor no scleral icterus OROPHARYNX:no exudate, no erythema and lips, buccal mucosa, and tongue normal  NECK: supple, no JVD, thyroid normal size, non-tender, without nodularity LYMPH:  no palpable lymphadenopathy in the cervical, axillary or inguinal LUNGS: clear to auscultation with normal respiratory effort HEART: regular rate & rhythm,  no murmurs and no lower extremity edema ABDOMEN: abdomen soft, non-tender, normoactive bowel sounds  Musculoskeletal: no cyanosis of  digits and no clubbing  PSYCH: alert , significant cognitive issues due to h/o mental retardation NEURO: no focal motor/sensory deficits  LABORATORY DATA:  I have reviewed the data as listed  . CBC Latest Ref Rng & Units 02/02/2016 01/16/2016  WBC 4.0 - 10.3 10e3/uL 4.0 3.3(L)  Hemoglobin 13.0 - 17.1 g/dL 8.4(L) 8.0(L)  Hematocrit 38.4 - 49.9 % 25.9(L) 25.0(L)  Platelets 140 - 400 10e3/uL 146 149   . CMP Latest Ref Rng & Units 02/02/2016 01/16/2016 01/16/2016  Glucose 70 - 140 mg/dl 80 90 -  BUN 7.0 - 26.0 mg/dL 15.3 20.0 -  Creatinine 0.7 - 1.3 mg/dL 1.3 1.4(H) -  Sodium 136 - 145 mEq/L 139 142 -  Potassium 3.5 - 5.1 mEq/L 4.1 4.2 -  Chloride 96 - 112 mEq/L - - -  CO2 22 - 29 mEq/L 22 23 -  Calcium 8.4 - 10.4 mg/dL 9.1 9.7 -  Total Protein 6.4 - 8.3 g/dL 7.3 7.4 6.7  Total Bilirubin 0.20 - 1.20 mg/dL 0.72 0.61 -  Alkaline Phos 40 - 150 U/L 73 74 -  AST 5 - 34 U/L 14 17 -  ALT 0 - 55 U/L 12 12 -   Component     Latest Ref Rng & Units 01/16/2016 02/02/2016  WBC     4.0 - 10.3 10e3/uL  4.0  NEUT#     1.5 - 6.5 10e3/uL  2.6  Hemoglobin     13.0 - 17.1 g/dL  8.4 (L)  HCT     38.4 - 49.9 % 24.9 (L) 25.9 (L)  Platelets     140 - 400 10e3/uL  146  MCV     79.3 - 98.0 fL  123.3 (H)  MCH     27.2 - 33.4 pg  40.0 (H)  MCHC     32.0 - 36.0 g/dL  32.4  RBC     4.20 - 5.82 10e6/uL  2.10 (L)  RDW     11.0 - 14.6 %  19.0 (H)  lymph#     0.9 - 3.3 10e3/uL  0.6 (L)  MONO#     0.1 - 0.9 10e3/uL  0.8  Eosinophils Absolute     0.0 - 0.5 10e3/uL  0.0  Basophils Absolute     0.0 - 0.1 10e3/uL  0.0  NEUT%     39.0 - 75.0 %  65.0  LYMPH%     14.0 - 49.0 %  15.0  MONO%     0.0 - 14.0 %  19.0 (H)  EOS%     0.0 - 7.0 %  0.5  BASO%     0.0 - 2.0 %  0.5  nRBC     0 - 0 %  0  Retic %     0.80 - 1.80 %  4.11 (H)  Retic Ct Abs     34.80 - 93.90 10e3/uL  86.31  Immature Retic Fract     3.00 - 10.60 %  14.50 (H)  IgG (Immunoglobin G), Serum     700 - 1600 mg/dL 1,425     IgA/Immunoglobulin A, Serum     61 - 437 mg/dL 254   IgM, Qn, Serum     15 - 143 mg/dL 28   Total Protein     6.0 - 8.5 g/dL 6.7   Albumin SerPl Elph-Mcnc     2.9 - 4.4 g/dL 3.3   Alpha 1     0.0 - 0.4 g/dL 0.3   Alpha2 Glob SerPl Elph-Mcnc     0.4 - 1.0 g/dL 0.7   B-Globulin SerPl Elph-Mcnc     0.7 - 1.3 g/dL 0.9   Gamma Glob SerPl Elph-Mcnc     0.4 - 1.8 g/dL 1.5   M Protein SerPl Elph-Mcnc     Not Observed g/dL Not Observed   Globulin, Total     2.2 - 3.9 g/dL 3.4   Albumin/Glob SerPl     0.7 - 1.7 1.0   IFE 1      Comment   Please Note (HCV):      Comment   Ig Kappa Free Light Chain     3.3 - 19.4 mg/L 45.0 (H)   Ig Lambda Free Light Chain     5.7 - 26.3 mg/L 31.1 (H)   Kappa/Lambda FluidC Ratio     0.26 - 1.65 1.45   Folate, Hemolysate     Not Estab. ng/mL 547.7   Folate, RBC     >498 ng/mL 2,200   LDH     125 - 245 U/L 208   Haptoglobin     34 - 200 mg/dL 113   Vitamin B12     211 - 946 pg/mL 496   Folate     >3.0 ng/mL >20.0   Thiamine     66.5 - 200.0 nmol/L 109.7   Cold Agglutinin Titer     Neg <1:32 Negative   TSH     0.320 - 4.118 m(IU)/L 2.368    Multiple Myeloma Panel (SPEP&IFE w/QIG)      No reference range information available      Comments: An apparent normal immunofixation pattern.  RADIOGRAPHIC STUDIES: I have personally reviewed the radiological images as listed and agreed with the findings in the report. No results found.  ASSESSMENT & PLAN:   71 year old very pleasant gentleman with mental retardation here with his legal guardian for evaluation of   1) Severe Macrocytic Anemia  Hemoglobin of 7.8 on 12/28/2015. Appears to have improved some today to 8.4 with improving MCV, multivitamins and B complex. Multiple myeloma panel and serum free light chains within normal limits TSH within normal limits LDH and haptoglobin suggest no evidence of hemolysis B12 and calcium levels within normal limits. He has had recent evaluation by  his urologist which suggested his prostate cancer has been stable. 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 -We discussed pros and cons of getting a bone marrow examination or a more definitive diagnosis and rule out MDS versus conservative management and follow-up. -Legal guardian after significant discussions and in keeping with the patient's input chooses to hold off on bone marrow biopsy and monitor the patient's labs which is quite reasonable. If his labs did not respond or get worse consider bone marrow biopsy at this time. -He was recommended to continue the patient's multivitamin and additional 1 tablet vitamin B  complex daily. -His legal guardian was recommended to call us immediately or taken to the emergency room for any symptoms suggestive of worsening symptoms from his anemia since that may be an indication for a blood transfusion.  Return in about 4 weeks (around 03/01/2016) for labs, appointment wi   All of the patients questions were answered with apparent satisfaction. The patient knows to call the clinic with any problems, questions or concerns.  I spent 20 minutes counseling the patient face to face. The total time spent in the appointment was 20 minutes and more than 50% was on counseling and direct patient cares.    Sullivan Lone MD [place] AAHIVMS [facility] Hematology/Oncology Physician [facility]  (Office):       [phone] (Work cell):  [phone] (Fax):           [phone]

## 2016-03-01 NOTE — Telephone Encounter (Signed)
GAVE PATIENT AVS REPORT AND APPOINTMENTS FOR MARCH  °

## 2016-03-25 NOTE — Progress Notes (Signed)
[name]    HEMATOLOGY/ONCOLOGY CLINIC NOTE  Date of Service: .03/01/2016  Patient Care Team: Marletta Lor, MD as PCP - General  CHIEF COMPLAINTS/PURPOSE OF CONSULTATION:  Macrocytic anemia  HISTORY OF PRESENTING ILLNESS:   Reginald Daniels is a wonderful 71 y.o. male who has been referred to [place] by Dr .[name], MD for evaluation and management of macrocytic anemia.  Patient has a h/o mental retardation (has a legal guardia -[name] who was present for visit), prostate cancer (managed by urology, s/p brachy RT with seeds), chronic macrocytosis with anemia and leukopenia.  He recently on 12/28/2015 has labs with his PCP including CBC which showed severe anemia with hgb of 7.8 MCV 123, nl PLT 185k, mild leucopenia 3.4k.  He has had significant macrocytosis since atleast 2013 but possibly longer/lifelong. However previously had hgb of 12.1 in 02/2013 and 13.2 in 05/2011. He has had wbc count of 2.7-3.4k in this time period.  Patient is a limited historian. Notes no significant dizziness or lightheadedness. Some fatigue. No weight loss. Night sweats. No bone pains. Denies any other new urinary symptoms. He is not reporting any other focal symptoms. He is legal guardian does not suggest any other complaints.   INTERVAL HISTORY  Reginald Daniels is here for followup for his macrocytic anemia. His hemoglobin has imfrom 8 to  8.4 and now to 8.9 with some decrease in his macrocytosis and decrease in RDW.  No acute new symptoms. He feels well and has no issues with symptomatic anemia.   MEDICAL HISTORY:  Past Medical History:  Diagnosis Date  . Cancer [facility])    prostate  . Leukopenia   . Macrocytosis   . Mental retardation   . Thrombocytopenia ([place])     SURGICAL HISTORY: status post seed implantation for prostate cancer  bilateral inguinal herniorrhaphies   SOCIAL HISTORY: Social History   Social History  . Marital status: Married    Spouse name: N/A  .  Number of children: N/A  . Years of education: N/A   Occupational History  . Not on file.   Social History Main Topics  . Smoking status: Never Smoker  . Smokeless tobacco: Never Used  . Alcohol use No  . Drug use: No  . Sexual activity: Not on file   Other Topics Concern  . Not on file   Social History Narrative  . No narrative on file  Never Smoked  patient has never been employed due to mental retardation  he lives with a court appointed guardian - [name] Smoking Status: never  FAMILY HISTORY:  father died at age 22  mother died young. A suicide death  No siblings   ALLERGIES:  has No Known Allergies.  MEDICATIONS:  Current Outpatient Prescriptions  Medication Sig Dispense Refill  . diphenhydrAMINE (BENADRYL ALLERGY) 25 mg capsule Take 50 mg by mouth every 6 (six) hours as needed.    . Multiple Vitamin (MULTIVITAMIN) tablet Take 1 tablet by mouth daily.       No current facility-administered medications for this visit.     REVIEW OF SYSTEMS:    10 Point review of Systems was done is negative except as noted above.  PHYSICAL EXAMINATION: ECOG PERFORMANCE STATUS: 1 - Symptomatic but completely ambulatory  . Vitals:   03/01/16 1315  BP: 107/62  Pulse: 77  Resp: 17  Temp: 97.7 F (36.5 C)   Filed Weights   03/01/16 1315  Weight: 141 lb 11.2 oz (64.3 kg)   .Body  mass index is 26.77 kg/m.  GENERAL:alert, in no acute distress and comfortable, appears pale SKIN: no acute rashes EYES: Conjunctivae with pallor no scleral icterus OROPHARYNX:no exudate, no erythema and lips, buccal mucosa, and tongue normal  NECK: supple, no JVD, thyroid normal size, non-tender, without nodularity LYMPH:  no palpable lymphadenopathy in the cervical, axillary or inguinal LUNGS: clear to auscultation with normal respiratory effort HEART: regular rate & rhythm,  no murmurs and no lower extremity edema ABDOMEN: abdomen soft, non-tender, normoactive bowel sounds    Musculoskeletal: no cyanosis of digits and no clubbing  PSYCH: alert , significant cognitive issues due to h/o mental retardation NEURO: no focal motor/sensory deficits  LABORATORY DATA:  I have reviewed the data as listed  . CBC Latest Ref Rng & Units 03/01/2016 02/02/2016 01/16/2016  WBC 4.0 - 10.3 10e3/uL 4.7 4.0 3.3(L)  Hemoglobin 13.0 - 17.1 g/dL 8.9(L) 8.4(L) 8.0(L)  Hematocrit 38.4 - 49.9 % 27.1(L) 25.9(L) 25.0(L)  Platelets 140 - 400 10e3/uL 140 146 149   . CBC    Component Value Date/Time   WBC 4.7 03/01/2016 1252   WBC 3.4 (L) 12/28/2015 1019   RBC 2.25 (L) 03/01/2016 1252   RBC 1.95 (L) 12/28/2015 1019   HGB 8.9 (L) 03/01/2016 1252   HCT 27.1 (L) 03/01/2016 1252   PLT 140 03/01/2016 1252   MCV 120.4 (H) 03/01/2016 1252   MCH 39.6 (H) 03/01/2016 1252   MCHC 32.8 03/01/2016 1252   MCHC 32.4 12/28/2015 1019   RDW 17.9 (H) 03/01/2016 1252   LYMPHSABS 0.5 (L) 03/01/2016 1252   MONOABS 0.8 03/01/2016 1252   EOSABS 0.1 03/01/2016 1252   BASOSABS 0.0 03/01/2016 1252     . CMP Latest Ref Rng & Units 02/02/2016 01/16/2016 01/16/2016  Glucose 70 - 140 mg/dl 80 90 -  BUN 7.0 - 26.0 mg/dL 15.3 20.0 -  Creatinine 0.7 - 1.3 mg/dL 1.3 1.4(H) -  Sodium 136 - 145 mEq/L 139 142 -  Potassium 3.5 - 5.1 mEq/L 4.1 4.2 -  Chloride 96 - 112 mEq/L - - -  CO2 22 - 29 mEq/L 22 23 -  Calcium 8.4 - 10.4 mg/dL 9.1 9.7 -  Total Protein 6.4 - 8.3 g/dL 7.3 7.4 6.7  Total Bilirubin 0.20 - 1.20 mg/dL 0.72 0.61 -  Alkaline Phos 40 - 150 U/L 73 74 -  AST 5 - 34 U/L 14 17 -  ALT 0 - 55 U/L 12 12 -   RADIOGRAPHIC STUDIES: I have personally reviewed the radiological images as listed and agreed with the findings in the report. No results found.  ASSESSMENT & PLAN:   71 year old very pleasant gentleman with mental retardation here with his legal guardian for evaluation of   1) Severe Macrocytic Anemia  Hemoglobin of 7.8 on 12/28/2015. Appears to be improving to 8.4 and now today to 8.9  with improving MCV and RDW while on multivitamins and B complex. Multiple myeloma panel and serum free light chains within normal limits TSH within normal limits LDH and haptoglobin suggest no evidence of hemolysis B12 and calcium levels within normal limits. He has had recent evaluation by his urologist which suggested his prostate cancer has been stable. Could potentially be a vitamin B responsive macrocytosis Cannot rule out MDS PLAN -patient is feeling well and is glad that his hgb is improving with vitamin replacement. -recommended he continue his multivitamin and additional 1 tablet vitamin B complex daily. -no indication for PRBC transfusion at this time. -considering improving Hgb  levels and resolving macrocytosis -- a decision was made to hold off on any BM Bx or other extensive w/u at this time. -we recommended to his legal guardian to call us immediately or take patient  to the emergency room for any symptoms suggestive of worsening symptoms from his anemia since that may be an indication for a blood transfusion.  RTC in 2 months with Dr Irene Limbo with labs   All of the patients questions were answered with apparent satisfaction. The patient knows to call the clinic with any problems, questions or concerns.  I spent 20 minutes counseling the patient face to face. The total time spent in the appointment was 20 minutes and more than 50% was on counseling and direct patient cares.    Sullivan Lone MD [place] AAHIVMS [facility] Hematology/Oncology Physician [facility]  (Office):       [phone] (Work cell):  [phone] (Fax):           [phone]

## 2016-04-26 ENCOUNTER — Other Ambulatory Visit (HOSPITAL_BASED_OUTPATIENT_CLINIC_OR_DEPARTMENT_OTHER): Payer: Medicare Other

## 2016-04-26 ENCOUNTER — Telehealth: Payer: Self-pay | Admitting: Hematology

## 2016-04-26 ENCOUNTER — Ambulatory Visit (HOSPITAL_BASED_OUTPATIENT_CLINIC_OR_DEPARTMENT_OTHER): Payer: Medicare Other | Admitting: Hematology

## 2016-04-26 VITALS — BP 105/66 | HR 83 | Temp 97.6°F | Resp 18 | Wt 136.7 lb

## 2016-04-26 DIAGNOSIS — D539 Nutritional anemia, unspecified: Secondary | ICD-10-CM

## 2016-04-26 DIAGNOSIS — D72819 Decreased white blood cell count, unspecified: Secondary | ICD-10-CM

## 2016-04-26 DIAGNOSIS — F79 Unspecified intellectual disabilities: Secondary | ICD-10-CM

## 2016-04-26 LAB — CBC & DIFF AND RETIC
BASO%: 0.4 % (ref 0.0–2.0)
Basophils Absolute: 0 10*3/uL (ref 0.0–0.1)
EOS%: 0.4 % (ref 0.0–7.0)
Eosinophils Absolute: 0 10*3/uL (ref 0.0–0.5)
HCT: 29.1 % — ABNORMAL LOW (ref 38.4–49.9)
HEMOGLOBIN: 9.6 g/dL — AB (ref 13.0–17.1)
IMMATURE RETIC FRACT: 13.9 % — AB (ref 3.00–10.60)
LYMPH#: 0.5 10*3/uL — AB (ref 0.9–3.3)
LYMPH%: 11.4 % — AB (ref 14.0–49.0)
MCH: 39.3 pg — ABNORMAL HIGH (ref 27.2–33.4)
MCHC: 33 g/dL (ref 32.0–36.0)
MCV: 119.3 fL — ABNORMAL HIGH (ref 79.3–98.0)
MONO#: 0.7 10*3/uL (ref 0.1–0.9)
MONO%: 14 % (ref 0.0–14.0)
NEUT%: 73.8 % (ref 39.0–75.0)
NEUTROS ABS: 3.4 10*3/uL (ref 1.5–6.5)
Platelets: 148 10*3/uL (ref 140–400)
RBC: 2.44 10*6/uL — AB (ref 4.20–5.82)
RDW: 17.8 % — ABNORMAL HIGH (ref 11.0–14.6)
Retic %: 3.14 % — ABNORMAL HIGH (ref 0.80–1.80)
Retic Ct Abs: 76.62 10*3/uL (ref 34.80–93.90)
WBC: 4.6 10*3/uL (ref 4.0–10.3)

## 2016-04-26 LAB — COMPREHENSIVE METABOLIC PANEL
ALBUMIN: 3.8 g/dL (ref 3.5–5.0)
ALK PHOS: 79 U/L (ref 40–150)
ALT: 10 U/L (ref 0–55)
AST: 16 U/L (ref 5–34)
Anion Gap: 8 mEq/L (ref 3–11)
BILIRUBIN TOTAL: 0.95 mg/dL (ref 0.20–1.20)
BUN: 20.8 mg/dL (ref 7.0–26.0)
CO2: 24 meq/L (ref 22–29)
CREATININE: 1.4 mg/dL — AB (ref 0.7–1.3)
Calcium: 9.6 mg/dL (ref 8.4–10.4)
Chloride: 110 mEq/L — ABNORMAL HIGH (ref 98–109)
EGFR: 52 mL/min/{1.73_m2} — AB (ref >90)
GLUCOSE: 86 mg/dL (ref 70–140)
Potassium: 4.3 mEq/L (ref 3.5–5.1)
SODIUM: 142 meq/L (ref 136–145)
TOTAL PROTEIN: 7.5 g/dL (ref 6.4–8.3)

## 2016-04-26 NOTE — Telephone Encounter (Signed)
Gave patient AVS and schedule per 04/26/2016 los

## 2016-04-26 NOTE — Progress Notes (Signed)
[name]    HEMATOLOGY/ONCOLOGY CLINIC NOTE  Date of Service: .04/26/2016  Patient Care Team: Marletta Lor, MD as PCP - General  CHIEF COMPLAINTS/PURPOSE OF CONSULTATION:  Macrocytic anemia  HISTORY OF PRESENTING ILLNESS:   Reginald Daniels is a wonderful 71 y.o. male who has been referred to [place] by Dr .[name], MD for evaluation and management of macrocytic anemia.  Patient has a h/o mental retardation (has a legal guardia -[name] who was present for visit), prostate cancer (managed by urology, s/p brachy RT with seeds), chronic macrocytosis with anemia and leukopenia.  He recently on 12/28/2015 has labs with his PCP including CBC which showed severe anemia with hgb of 7.8 MCV 123, nl PLT 185k, mild leucopenia 3.4k.  He has had significant macrocytosis since atleast 2013 but possibly longer/lifelong. However previously had hgb of 12.1 in 02/2013 and 13.2 in 05/2011. He has had wbc count of 2.7-3.4k in this time period.  Patient is a limited historian. Notes no significant dizziness or lightheadedness. Some fatigue. No weight loss. Night sweats. No bone pains. Denies any other new urinary symptoms. He is not reporting any other focal symptoms. He is legal guardian does not suggest any other complaints.   INTERVAL HISTORY  Mr Adcock is here for followup for his macrocytic anemia. His hemoglobin has imfrom 8 to  8.4 to 8.9 and is now up to 9.6 with decreasing macrocytosis and decrease in RDW. Leukopenia has resolved. No acute new symptoms. He feels well and has no issues with symptomatic anemia.  Has been eating well and has no new focal symptoms.  MEDICAL HISTORY:  Past Medical History:  Diagnosis Date  . Cancer [facility])    prostate  . Leukopenia   . Macrocytosis   . Mental retardation   . Thrombocytopenia ([place])     SURGICAL HISTORY: status post seed implantation for prostate cancer  bilateral inguinal herniorrhaphies   SOCIAL HISTORY: Social  History   Social History  . Marital status: Married    Spouse name: N/A  . Number of children: N/A  . Years of education: N/A   Occupational History  . Not on file.   Social History Main Topics  . Smoking status: Never Smoker  . Smokeless tobacco: Never Used  . Alcohol use No  . Drug use: No  . Sexual activity: Not on file   Other Topics Concern  . Not on file   Social History Narrative  . No narrative on file  Never Smoked  patient has never been employed due to mental retardation  he lives with a court appointed guardian - [name] Smoking Status: never  FAMILY HISTORY:  father died at age 65  mother died young. A suicide death  No siblings   ALLERGIES:  has No Known Allergies.  MEDICATIONS:  Current Outpatient Prescriptions  Medication Sig Dispense Refill  . diphenhydrAMINE (BENADRYL ALLERGY) 25 mg capsule Take 50 mg by mouth every 6 (six) hours as needed.    . Multiple Vitamin (MULTIVITAMIN) tablet Take 1 tablet by mouth daily.       No current facility-administered medications for this visit.     REVIEW OF SYSTEMS:    10 Point review of Systems was done is negative except as noted above.  PHYSICAL EXAMINATION: ECOG PERFORMANCE STATUS: 1 - Symptomatic but completely ambulatory  . Vitals:   04/26/16 1056  BP: 105/66  Pulse: 83  Resp: 18  Temp: 97.6 F (36.4 C)   Filed Weights  04/26/16 1056  Weight: 136 lb 11.2 oz (62 kg)   .Body mass index is 25.83 kg/m.  GENERAL:alert, in no acute distress and comfortable, appears pale SKIN: no acute rashes EYES: Conjunctivae with pallor no scleral icterus OROPHARYNX:no exudate, no erythema and lips, buccal mucosa, and tongue normal  NECK: supple, no JVD, thyroid normal size, non-tender, without nodularity LYMPH:  no palpable lymphadenopathy in the cervical, axillary or inguinal LUNGS: clear to auscultation with normal respiratory effort HEART: regular rate & rhythm,  no murmurs and no lower  extremity edema ABDOMEN: abdomen soft, non-tender, normoactive bowel sounds  Musculoskeletal: no cyanosis of digits and no clubbing  PSYCH: alert , significant cognitive issues due to h/o mental retardation NEURO: no focal motor/sensory deficits  LABORATORY DATA:  I have reviewed the data as listed  . CBC Latest Ref Rng & Units 04/26/2016 03/01/2016 02/02/2016  WBC 4.0 - 10.3 10e3/uL 4.6 4.7 4.0  Hemoglobin 13.0 - 17.1 g/dL 9.6(L) 8.9(L) 8.4(L)  Hematocrit 38.4 - 49.9 % 29.1(L) 27.1(L) 25.9(L)  Platelets 140 - 400 10e3/uL 148 140 146   . CBC    Component Value Date/Time   WBC 4.6 04/26/2016 1027   WBC 3.4 (L) 12/28/2015 1019   RBC 2.44 (L) 04/26/2016 1027   RBC 1.95 (L) 12/28/2015 1019   HGB 9.6 (L) 04/26/2016 1027   HCT 29.1 (L) 04/26/2016 1027   PLT 148 04/26/2016 1027   MCV 119.3 (H) 04/26/2016 1027   MCH 39.3 (H) 04/26/2016 1027   MCHC 33.0 04/26/2016 1027   MCHC 32.4 12/28/2015 1019   RDW 17.8 (H) 04/26/2016 1027   LYMPHSABS 0.5 (L) 04/26/2016 1027   MONOABS 0.7 04/26/2016 1027   EOSABS 0.0 04/26/2016 1027   BASOSABS 0.0 04/26/2016 1027     . CMP Latest Ref Rng & Units 02/02/2016 01/16/2016 01/16/2016  Glucose 70 - 140 mg/dl 80 90 -  BUN 7.0 - 26.0 mg/dL 15.3 20.0 -  Creatinine 0.7 - 1.3 mg/dL 1.3 1.4(H) -  Sodium 136 - 145 mEq/L 139 142 -  Potassium 3.5 - 5.1 mEq/L 4.1 4.2 -  Chloride 96 - 112 mEq/L - - -  CO2 22 - 29 mEq/L 22 23 -  Calcium 8.4 - 10.4 mg/dL 9.1 9.7 -  Total Protein 6.4 - 8.3 g/dL 7.3 7.4 6.7  Total Bilirubin 0.20 - 1.20 mg/dL 0.72 0.61 -  Alkaline Phos 40 - 150 U/L 73 74 -  AST 5 - 34 U/L 14 17 -  ALT 0 - 55 U/L 12 12 -   RADIOGRAPHIC STUDIES: I have personally reviewed the radiological images as listed and agreed with the findings in the report. No results found.  ASSESSMENT & PLAN:   71 year old very pleasant gentleman with mental retardation here with his legal guardian for evaluation of   1) Severe Macrocytic Anemia  Hemoglobin  of 7.8 on 12/28/2015. Appears to be improving to 8.4 then to 8.9 and now upto 9.6 with improving MCV and RDW while on multivitamins and B complex. Multiple myeloma panel and serum free light chains within normal limits TSH within normal limits LDH and haptoglobin suggest no evidence of hemolysis B12 and calcium levels within normal limits. He has had recent evaluation by his urologist which suggested his prostate cancer has been stable. Could potentially be a vitamin B responsive macrocytosis Cannot rule out MDS 2) Leukopenia - resolved PLAN -patient is feeling well and is glad that his hgb is improving with vitamin replacement. -recommended he continue his multivitamin and  additional 1 tablet vitamin B complex daily. -no indication for PRBC transfusion at this time. -considering improving Hgb levels and resolving macrocytosis -- a decision was made to hold off on any BM Bx or other extensive w/u at this time. -we recommended to his legal guardian to call us immediately or take patient  to the emergency room for any symptoms suggestive of worsening symptoms from his anemia since that may be an indication for a blood transfusion.  RTC in 4 months with Dr Irene Limbo with labs. If his labs show an improving hemoglobin will discharge him after his next follow-up visit back to his primary care physician.   All of the patients questions were answered with apparent satisfaction. The patient knows to call the clinic with any problems, questions or concerns.  I spent 20 minutes counseling the patient face to face. The total time spent in the appointment was 20 minutes and more than 50% was on counseling and direct patient cares.    Sullivan Lone MD [place] AAHIVMS [facility] Hematology/Oncology Physician [facility]  (Office):       [phone] (Work cell):  [phone] (Fax):           [phone]

## 2016-07-04 ENCOUNTER — Ambulatory Visit (INDEPENDENT_AMBULATORY_CARE_PROVIDER_SITE_OTHER): Payer: Medicare Other | Admitting: Internal Medicine

## 2016-07-04 ENCOUNTER — Encounter: Payer: Self-pay | Admitting: Internal Medicine

## 2016-07-04 VITALS — BP 112/62 | HR 78 | Temp 97.5°F | Wt 135.2 lb

## 2016-07-04 DIAGNOSIS — F79 Unspecified intellectual disabilities: Secondary | ICD-10-CM

## 2016-07-04 DIAGNOSIS — H6123 Impacted cerumen, bilateral: Secondary | ICD-10-CM | POA: Diagnosis not present

## 2016-07-04 NOTE — Progress Notes (Signed)
   Subjective:    Patient ID: Reginald Daniels, male    DOB: 1946-02-17, 71 y.o.   MRN: [id number]  HPI  71 year old patient has a history of mental retardation.  He presents today with a chief complaint of decreased hearing. Patient noted have bilateral cerumen impactions  Past Medical History:  Diagnosis Date  . Cancer [facility])    prostate  . Leukopenia   . Macrocytosis   . Mental retardation   . Thrombocytopenia [facility])      Social History   Social History  . Marital status: Married    Spouse name: N/A  . Number of children: N/A  . Years of education: N/A   Occupational History  . Not on file.   Social History Main Topics  . Smoking status: Never Smoker  . Smokeless tobacco: Never Used  . Alcohol use No  . Drug use: No  . Sexual activity: Not on file   Other Topics Concern  . Not on file   Social History Narrative  . No narrative on file    No past surgical history on file.  No family history on file.  No Known Allergies  Current Outpatient Prescriptions on File Prior to Visit  Medication Sig Dispense Refill  . diphenhydrAMINE (BENADRYL ALLERGY) 25 mg capsule Take 50 mg by mouth every 6 (six) hours as needed.    . Multiple Vitamin (MULTIVITAMIN) tablet Take 1 tablet by mouth daily.       No current facility-administered medications on file prior to visit.     BP 112/62 (BP Location: Left Arm, Patient Position: Sitting, Cuff Size: Normal)   Pulse 78   Temp 97.5 F (36.4 C) (Oral)   Wt 135 lb 3.2 oz (61.3 kg)   SpO2 97%   BMI 25.55 kg/m     Review of Systems  HENT: Positive for hearing loss.        Objective:   Physical Exam  Constitutional: He appears well-developed and well-nourished. No distress.  Blood pressure 112/62  HENT:  Bilateral cerumen impactions          Assessment & Plan:   Bilateral cerumen impactions.  Both the ears irrigated.  Minimal residual wax but hearing normalized.  Patient instructions dispensed  Follow-up  as scheduled for annual exam  [name]

## 2016-07-04 NOTE — Patient Instructions (Addendum)
WE NOW OFFER   Reginald Daniels's FAST TRACK!!!  SAME DAY Appointments for ACUTE CARE  Such as: Sprains, Injuries, cuts, abrasions, rashes, muscle pain, joint pain, back pain Colds, flu, sore throats, headache, allergies, cough, fever  Ear pain, sinus and eye infections Abdominal pain, nausea, vomiting, diarrhea, upset stomach Animal/insect bites  3 Easy Ways to Schedule: Walk-In Scheduling Call in scheduling Mychart Sign-up: [email or web address]         Earwax Buildup Your ears make a substance called earwax. It may also be called cerumen. Sometimes, too much earwax builds up in your ear canal. This can cause ear pain and make it harder for you to hear. CAUSES This condition is caused by too much earwax production or buildup. RISK FACTORS The following factors may make you more likely to develop this condition:  Cleaning your ears often with swabs.  Having narrow ear canals.  Having earwax that is overly thick or sticky.  Having eczema.  Being dehydrated. SYMPTOMS Symptoms of this condition include:  Reduced hearing.  Ear drainage.  Ear pain.  Ear itch.  A feeling of fullness in the ear or feeling that the ear is plugged.  Ringing in the ear.  Coughing. DIAGNOSIS Your health care provider can diagnose this condition based on your symptoms and medical history. Your health care provider will also do an ear exam to look inside your ear with a scope (otoscope). You may also have a hearing test. TREATMENT Treatment for this condition includes:  Over-the-counter or prescription ear drops to soften the earwax.  Earwax removal by a health care provider. This may be done:  By flushing the ear with body-temperature water.  With a medical instrument that has a loop at the end (earwax curette).  With a suction device. HOME CARE INSTRUCTIONS  Take over-the-counter and prescription medicines only as told by your health care provider.  Do not  put any objects, including an ear swab, into your ear. You can clean the opening of your ear canal with a washcloth.  Drink enough water to keep your urine clear or pale yellow.  If you have frequent earwax buildup or you use hearing aids, consider seeing your health care provider every 6-12 months for routine preventive ear cleanings. Keep all follow-up visits as told by your health care provider. SEEK MEDICAL CARE IF:  You have ear pain.  Your condition does not improve with treatment.  You have hearing loss.  You have blood, pus, or other fluid coming from your ear. This information is not intended to replace advice given to you by your health care provider. Make sure you discuss any questions you have with your health care provider. Document Released: 03/15/2004 Document Revised: 05/30/2015 Document Reviewed: 09/22/2014 Elsevier Interactive Patient Education  2017 [place] Your ears make a substance called earwax. It may also be called cerumen. Sometimes, too much earwax builds up in your ear canal. This can cause ear pain and make it harder for you to hear. CAUSES This condition is caused by too much earwax production or buildup. RISK FACTORS The following factors may make you more likely to develop this condition:  Cleaning your ears often with swabs.  Having narrow ear canals.  Having earwax that is overly thick or sticky.  Having eczema.  Being dehydrated. SYMPTOMS Symptoms of this condition include:  Reduced hearing.  Ear drainage.  Ear pain.  Ear itch.  A feeling of fullness in the ear or feeling that the ear is  plugged.  Ringing in the ear.  Coughing. DIAGNOSIS Your health care provider can diagnose this condition based on your symptoms and medical history. Your health care provider will also do an ear exam to look inside your ear with a scope (otoscope). You may also have a hearing test. TREATMENT Treatment for this condition  includes:  Over-the-counter or prescription ear drops to soften the earwax.  Earwax removal by a health care provider. This may be done:  By flushing the ear with body-temperature water.  With a medical instrument that has a loop at the end (earwax curette).  With a suction device. HOME CARE INSTRUCTIONS  Take over-the-counter and prescription medicines only as told by your health care provider.  Do not put any objects, including an ear swab, into your ear. You can clean the opening of your ear canal with a washcloth.  Drink enough water to keep your urine clear or pale yellow.  If you have frequent earwax buildup or you use hearing aids, consider seeing your health care provider every 6-12 months for routine preventive ear cleanings. Keep all follow-up visits as told by your health care provider. SEEK MEDICAL CARE IF:  You have ear pain.  Your condition does not improve with treatment.  You have hearing loss.  You have blood, pus, or other fluid coming from your ear. This information is not intended to replace advice given to you by your health care provider. Make sure you discuss any questions you have with your health care provider. Document Released: 03/15/2004 Document Revised: 05/30/2015 Document Reviewed: 09/22/2014 Elsevier Interactive Patient Education  2017 [employer].

## 2016-07-04 NOTE — Addendum Note (Signed)
Addended by: Wyvonne Lenz on: 07/04/2016 12:03 PM   Modules accepted: Orders

## 2016-08-24 ENCOUNTER — Telehealth: Payer: Self-pay | Admitting: Hematology

## 2016-08-24 NOTE — Telephone Encounter (Signed)
Spoke with [name] about appointment change and confirmed the change reschedule for 7/26 @ 9:45 for lab and 10:20 for [facility]

## 2016-08-27 ENCOUNTER — Ambulatory Visit: Payer: Medicare Other | Admitting: Hematology

## 2016-08-27 ENCOUNTER — Other Ambulatory Visit: Payer: Medicare Other

## 2016-09-13 ENCOUNTER — Ambulatory Visit (HOSPITAL_BASED_OUTPATIENT_CLINIC_OR_DEPARTMENT_OTHER): Payer: Medicare Other | Admitting: Hematology

## 2016-09-13 ENCOUNTER — Telehealth: Payer: Self-pay

## 2016-09-13 ENCOUNTER — Other Ambulatory Visit (HOSPITAL_BASED_OUTPATIENT_CLINIC_OR_DEPARTMENT_OTHER): Payer: Medicare Other

## 2016-09-13 VITALS — BP 107/43 | HR 75 | Temp 97.5°F | Resp 17 | Ht 61.0 in | Wt 137.8 lb

## 2016-09-13 DIAGNOSIS — D696 Thrombocytopenia, unspecified: Secondary | ICD-10-CM

## 2016-09-13 DIAGNOSIS — D61818 Other pancytopenia: Secondary | ICD-10-CM

## 2016-09-13 DIAGNOSIS — D539 Nutritional anemia, unspecified: Secondary | ICD-10-CM | POA: Diagnosis not present

## 2016-09-13 LAB — COMPREHENSIVE METABOLIC PANEL
ALT: 13 U/L (ref 0–55)
AST: 17 U/L (ref 5–34)
Albumin: 3.6 g/dL (ref 3.5–5.0)
Alkaline Phosphatase: 73 U/L (ref 40–150)
Anion Gap: 9 mEq/L (ref 3–11)
BILIRUBIN TOTAL: 1.04 mg/dL (ref 0.20–1.20)
BUN: 16.9 mg/dL (ref 7.0–26.0)
CHLORIDE: 111 meq/L — AB (ref 98–109)
CO2: 21 meq/L — AB (ref 22–29)
Calcium: 9.5 mg/dL (ref 8.4–10.4)
Creatinine: 1.3 mg/dL (ref 0.7–1.3)
EGFR: 53 mL/min/{1.73_m2} — AB (ref >90)
GLUCOSE: 91 mg/dL (ref 70–140)
Potassium: 4.2 mEq/L (ref 3.5–5.1)
SODIUM: 140 meq/L (ref 136–145)
TOTAL PROTEIN: 7.2 g/dL (ref 6.4–8.3)

## 2016-09-13 LAB — MANUAL DIFFERENTIAL
ALC: 0.4 10*3/uL — ABNORMAL LOW (ref 0.9–3.3)
ANC (CHCC manual diff): 3.8 10*3/uL (ref 1.5–6.5)
BLASTS: 2 % — AB (ref 0–0)
Band Neutrophils: 21 % — ABNORMAL HIGH (ref 0–10)
Basophil: 1 % (ref 0–2)
EOS: 1 % (ref 0–7)
LYMPH: 8 % — ABNORMAL LOW (ref 14–49)
METAMYELOCYTES PCT: 3 % — AB (ref 0–0)
MONO: 16 % — ABNORMAL HIGH (ref 0–14)
MYELOCYTES: 0 % (ref 0–0)
OTHER CELL: 0 % (ref 0–0)
PLT EST: DECREASED
PROMYELO: 0 % (ref 0–0)
SEG: 48 % (ref 38–77)
VARIANT LYMPH: 0 % (ref 0–0)
nRBC: 1 % — ABNORMAL HIGH (ref 0–0)

## 2016-09-13 LAB — CBC & DIFF AND RETIC
HCT: 24.8 % — ABNORMAL LOW (ref 38.4–49.9)
HEMOGLOBIN: 8 g/dL — AB (ref 13.0–17.1)
IMMATURE RETIC FRACT: 13.6 % — AB (ref 3.00–10.60)
MCH: 38.8 pg — AB (ref 27.2–33.4)
MCHC: 32.3 g/dL (ref 32.0–36.0)
MCV: 120.4 fL — ABNORMAL HIGH (ref 79.3–98.0)
Platelets: 138 10*3/uL — ABNORMAL LOW (ref 140–400)
RBC: 2.06 10*6/uL — AB (ref 4.20–5.82)
RDW: 19.3 % — AB (ref 11.0–14.6)
RETIC %: 4.39 % — AB (ref 0.80–1.80)
Retic Ct Abs: 90.43 10*3/uL (ref 34.80–93.90)
WBC: 5.3 10*3/uL (ref 4.0–10.3)

## 2016-09-13 LAB — LACTATE DEHYDROGENASE: LDH: 250 U/L — ABNORMAL HIGH (ref 125–245)

## 2016-09-13 NOTE — Telephone Encounter (Signed)
Left message to call me back. Wanted to confirm appt tomorrow morning in sickle cell at 0800 for 1U PRBCs. May need clarification of directions.

## 2016-09-14 ENCOUNTER — Telehealth: Payer: Self-pay | Admitting: Hematology

## 2016-09-14 ENCOUNTER — Other Ambulatory Visit: Payer: Self-pay

## 2016-09-14 ENCOUNTER — Ambulatory Visit: Payer: Medicare Other

## 2016-09-14 ENCOUNTER — Encounter (HOSPITAL_COMMUNITY): Payer: Medicare Other

## 2016-09-14 ENCOUNTER — Ambulatory Visit (HOSPITAL_COMMUNITY)
Admission: RE | Admit: 2016-09-14 | Discharge: 2016-09-14 | Disposition: A | Discharge status: Home / Self Care | Payer: Medicare Other | Source: Ambulatory Visit | Attending: Hematology | Admitting: Hematology

## 2016-09-14 DIAGNOSIS — D649 Anemia, unspecified: Secondary | ICD-10-CM | POA: Diagnosis not present

## 2016-09-14 DIAGNOSIS — D539 Nutritional anemia, unspecified: Secondary | ICD-10-CM

## 2016-09-14 LAB — ABO/RH: ABO/RH(D): O NEG

## 2016-09-14 LAB — PREPARE RBC (CROSSMATCH)

## 2016-09-14 NOTE — Telephone Encounter (Signed)
Unable to lvm on pt phone. Left message on Larrys phone to inform of lab appt today per sch msg at noon for type and cross

## 2016-09-15 ENCOUNTER — Ambulatory Visit: Payer: Medicare Other

## 2016-09-15 DIAGNOSIS — D539 Nutritional anemia, unspecified: Secondary | ICD-10-CM

## 2016-09-15 DIAGNOSIS — D649 Anemia, unspecified: Secondary | ICD-10-CM | POA: Diagnosis not present

## 2016-09-15 MED ORDER — SODIUM CHLORIDE 0.9 % IV SOLN
250.0000 mL | Freq: Once | INTRAVENOUS | Status: AC
  Administered 2016-09-15: 250 mL via INTRAVENOUS

## 2016-09-15 MED ORDER — ACETAMINOPHEN 325 MG PO TABS
650.0000 mg | ORAL_TABLET | Freq: Once | ORAL | Status: AC
  Administered 2016-09-15: 650 mg via ORAL

## 2016-09-15 MED ORDER — ACETAMINOPHEN 325 MG PO TABS
ORAL_TABLET | ORAL | Status: AC
  Filled 2016-09-15: qty 2

## 2016-09-15 MED ORDER — SODIUM CHLORIDE 0.9 % IV SOLN: 250.0000 mL | Freq: Once | INTRAVENOUS | Status: DC

## 2016-09-15 NOTE — Progress Notes (Signed)
Patient here today to receive 1 unit of PRBC. Per Nunzio Cory at blood bank, due to short supply of negative blood "men over a certain age are to receive positive blood" confirming this is ok by physician. Verbal orders received from on-call MD, Dr. Alen Blew, ok to administer, blood bank informed.

## 2016-09-15 NOTE — Patient Instructions (Signed)

## 2016-09-16 LAB — TYPE AND SCREEN
ABO/RH(D): O NEG
ANTIBODY SCREEN: NEGATIVE
Unit division: 0

## 2016-09-16 LAB — BPAM RBC
Blood Product Expiration Date: 201808292359
ISSUE DATE / TIME: 201807281036
Unit Type and Rh: 5100

## 2016-09-20 ENCOUNTER — Telehealth: Payer: Self-pay

## 2016-09-20 ENCOUNTER — Ambulatory Visit (HOSPITAL_BASED_OUTPATIENT_CLINIC_OR_DEPARTMENT_OTHER): Payer: Medicare Other | Admitting: Hematology

## 2016-09-20 ENCOUNTER — Other Ambulatory Visit (HOSPITAL_BASED_OUTPATIENT_CLINIC_OR_DEPARTMENT_OTHER): Payer: Medicare Other

## 2016-09-20 VITALS — BP 106/47 | HR 73 | Temp 97.9°F | Resp 18 | Ht 61.0 in | Wt 138.1 lb

## 2016-09-20 DIAGNOSIS — D696 Thrombocytopenia, unspecified: Secondary | ICD-10-CM | POA: Diagnosis not present

## 2016-09-20 DIAGNOSIS — D539 Nutritional anemia, unspecified: Secondary | ICD-10-CM | POA: Diagnosis not present

## 2016-09-20 DIAGNOSIS — D61818 Other pancytopenia: Secondary | ICD-10-CM

## 2016-09-20 LAB — CBC & DIFF AND RETIC
BASO%: 0.3 % (ref 0.0–2.0)
Basophils Absolute: 0 10*3/uL (ref 0.0–0.1)
EOS%: 0.3 % (ref 0.0–7.0)
Eosinophils Absolute: 0 10*3/uL (ref 0.0–0.5)
HEMATOCRIT: 29 % — AB (ref 38.4–49.9)
HGB: 9.4 g/dL — ABNORMAL LOW (ref 13.0–17.1)
Immature Retic Fract: 10.5 % (ref 3.00–10.60)
LYMPH#: 0.5 10*3/uL — AB (ref 0.9–3.3)
LYMPH%: 15.6 % (ref 14.0–49.0)
MCH: 37 pg — AB (ref 27.2–33.4)
MCHC: 32.4 g/dL (ref 32.0–36.0)
MCV: 114.2 fL — ABNORMAL HIGH (ref 79.3–98.0)
MONO#: 0.6 10*3/uL (ref 0.1–0.9)
MONO%: 17.2 % — ABNORMAL HIGH (ref 0.0–14.0)
NEUT%: 66.6 % (ref 39.0–75.0)
NEUTROS ABS: 2.2 10*3/uL (ref 1.5–6.5)
Platelets: 74 10*3/uL — ABNORMAL LOW (ref 140–400)
RBC: 2.54 10*6/uL — AB (ref 4.20–5.82)
RDW: 23.1 % — AB (ref 11.0–14.6)
RETIC %: 3.19 % — AB (ref 0.80–1.80)
RETIC CT ABS: 81.03 10*3/uL (ref 34.80–93.90)
WBC: 3.3 10*3/uL — AB (ref 4.0–10.3)

## 2016-09-20 LAB — COMPREHENSIVE METABOLIC PANEL
ALT: 10 U/L (ref 0–55)
AST: 15 U/L (ref 5–34)
Albumin: 3.6 g/dL (ref 3.5–5.0)
Alkaline Phosphatase: 74 U/L (ref 40–150)
Anion Gap: 7 mEq/L (ref 3–11)
BUN: 17.3 mg/dL (ref 7.0–26.0)
CHLORIDE: 110 meq/L — AB (ref 98–109)
CO2: 23 meq/L (ref 22–29)
CREATININE: 1.3 mg/dL (ref 0.7–1.3)
Calcium: 9.7 mg/dL (ref 8.4–10.4)
EGFR: 55 mL/min/{1.73_m2} — ABNORMAL LOW (ref >90)
GLUCOSE: 84 mg/dL (ref 70–140)
POTASSIUM: 4.4 meq/L (ref 3.5–5.1)
SODIUM: 140 meq/L (ref 136–145)
Total Bilirubin: 1.04 mg/dL (ref 0.20–1.20)
Total Protein: 7.2 g/dL (ref 6.4–8.3)

## 2016-09-20 LAB — TECHNOLOGIST REVIEW: Technologist Review: 1

## 2016-09-20 NOTE — Telephone Encounter (Signed)
Called patient with follow up appointment for 8/22

## 2016-09-28 ENCOUNTER — Other Ambulatory Visit: Payer: Self-pay | Admitting: Physician Assistant

## 2016-09-28 ENCOUNTER — Other Ambulatory Visit: Payer: Self-pay | Admitting: General Surgery

## 2016-09-30 ENCOUNTER — Other Ambulatory Visit: Payer: Self-pay | Admitting: Radiology

## 2016-09-30 MED ORDER — B COMPLEX VITAMINS PO CAPS: 1.0000 | ORAL_CAPSULE | Freq: Every day | ORAL | Status: AC

## 2016-09-30 NOTE — Progress Notes (Signed)
[name]    HEMATOLOGY/ONCOLOGY CLINIC NOTE  Date of Service: .09/13/2016  Patient Care Team: Marletta Lor, MD as PCP - General  CHIEF COMPLAINTS/PURPOSE OF CONSULTATION:  Macrocytic anemia  HISTORY OF PRESENTING ILLNESS:   Reginald Daniels is a wonderful 71 y.o. male who has been referred to [place] by Dr .Burnice Logan, Doretha Sou, MD for evaluation and management of macrocytic anemia.  Patient has a h/o mental retardation (has a legal guardia -[name] who was present for visit), prostate cancer (managed by urology, s/p brachy RT with seeds), chronic macrocytosis with anemia and leukopenia.  He recently on 12/28/2015 has labs with his PCP including CBC which showed severe anemia with hgb of 7.8 MCV 123, nl PLT 185k, mild leucopenia 3.4k.  He has had significant macrocytosis since atleast 2013 but possibly longer/lifelong. However previously had hgb of 12.1 in 02/2013 and 13.2 in 05/2011. He has had wbc count of 2.7-3.4k in this time period.  Patient is a limited historian. Notes no significant dizziness or lightheadedness. Some fatigue. No weight loss. Night sweats. No bone pains. Denies any other new urinary symptoms. He is not reporting any other focal symptoms. He is legal guardian does not suggest any other complaints.   INTERVAL HISTORY  Reginald Daniels is here for followup for his macrocytic anemia. His hemoglobin has dropped from 9.6 to 8 with some thrombocytopenia plt 138k. Notes increased symptoms of anemia with light headedness and dizziness and increased fatigue. I discussed the labs results with the patient and his accompanying legal guardian and informed consent was obtained for PRBC transfusion and bone marrow biopsy. Patient does not report any other new focal symptoms. Patient denies any overt issues with bleeding.  MEDICAL HISTORY:  Past Medical History:  Diagnosis Date  . Cancer [facility])    prostate  . Leukopenia   . Macrocytosis   . Mental retardation   .  Thrombocytopenia ([place])     SURGICAL HISTORY: status post seed implantation for prostate cancer  bilateral inguinal herniorrhaphies   SOCIAL HISTORY: Social History   Social History  . Marital status: Married    Spouse name: N/A  . Number of children: N/A  . Years of education: N/A   Occupational History  . Not on file.   Social History Main Topics  . Smoking status: Never Smoker  . Smokeless tobacco: Never Used  . Alcohol use No  . Drug use: No  . Sexual activity: Not on file   Other Topics Concern  . Not on file   Social History Narrative  . No narrative on file  Never Smoked  patient has never been employed due to mental retardation  he lives with a court appointed guardian - [name] Smoking Status: never  FAMILY HISTORY:  father died at age 9  mother died young. A suicide death  No siblings   ALLERGIES:  has No Known Allergies.  MEDICATIONS:  Current Outpatient Prescriptions  Medication Sig Dispense Refill  . diphenhydrAMINE (BENADRYL ALLERGY) 25 mg capsule Take 50 mg by mouth every 6 (six) hours as needed.    . Multiple Vitamin (MULTIVITAMIN) tablet Take 1 tablet by mouth daily.       No current facility-administered medications for this visit.     REVIEW OF SYSTEMS:    10 Point review of Systems was done is negative except as noted above.  PHYSICAL EXAMINATION: ECOG PERFORMANCE STATUS: 1 - Symptomatic but completely ambulatory  . Vitals:   09/13/16 1030  BP: (!) 107/43  Pulse: 75  Resp: 17  Temp: (!) 97.5 F (36.4 C)  SpO2: 99%   Filed Weights   09/13/16 1030  Weight: 137 lb 12.8 oz (62.5 kg)   .Body mass index is 26.04 kg/m.  GENERAL:alert, in no acute distress and comfortable, appears pale SKIN: no acute rashes EYES: Conjunctivae with pallor no scleral icterus OROPHARYNX:no exudate, no erythema and lips, buccal mucosa, and tongue normal  NECK: supple, no JVD, thyroid normal size, non-tender, without nodularity LYMPH:  no  palpable lymphadenopathy in the cervical, axillary or inguinal LUNGS: clear to auscultation with normal respiratory effort HEART: regular rate & rhythm,  no murmurs and no lower extremity edema ABDOMEN: abdomen soft, non-tender, normoactive bowel sounds  Musculoskeletal: no cyanosis of digits and no clubbing  PSYCH: alert , significant cognitive issues due to h/o mental retardation NEURO: no focal motor/sensory deficits  LABORATORY DATA:  I have reviewed the data as listed  . CBC Latest Ref Rng & Units 09/13/2016 04/26/2016  WBC 4.0 - 10.3 10e3/uL 5.3 4.6  Hemoglobin 13.0 - 17.1 g/dL 8.0(L) 9.6(L)  Hematocrit 38.4 - 49.9 % 24.8(L) 29.1(L)  Platelets 140 - 400 10e3/uL 138(L) 148   . CMP Latest Ref Rng & Units 09/20/2016 09/13/2016 04/26/2016  Glucose 70 - 140 mg/dl 84 91 86  BUN 7.0 - 26.0 mg/dL 17.3 16.9 20.8  Creatinine 0.7 - 1.3 mg/dL 1.3 1.3 1.4(H)  Sodium 136 - 145 mEq/L 140 140 142  Potassium 3.5 - 5.1 mEq/L 4.4 4.2 4.3  Chloride 96 - 112 mEq/L - - -  CO2 22 - 29 mEq/L 23 21(L) 24  Calcium 8.4 - 10.4 mg/dL 9.7 9.5 9.6  Total Protein 6.4 - 8.3 g/dL 7.2 7.2 7.5  Total Bilirubin 0.20 - 1.20 mg/dL 1.04 1.04 0.95  Alkaline Phos 40 - 150 U/L 74 73 79  AST 5 - 34 U/L 15 17 16   ALT 0 - 55 U/L 10 13 10    RADIOGRAPHIC STUDIES: I have personally reviewed the radiological images as listed and agreed with the findings in the report. No results found.  ASSESSMENT & PLAN:   71 year old very pleasant gentleman with mental retardation here with his legal guardian for evaluation of   1) Severe Macrocytic Anemia  Hemoglobin of 7.8 on 12/28/2015. Appears to be improving to 8.4 then to 8.9 and now upto 9.6 with improving MCV and RDW while on multivitamins and B complex. Multiple myeloma panel and serum free light chains within normal limits TSH within normal limits LDH and haptoglobin suggest no evidence of hemolysis B12 and calcium levels within normal limits. He has had recent evaluation by  his urologist which suggested his prostate cancer has been stable. Could potentially be a vitamin B responsive macrocytosis Cannot rule out MDS 2) Leukopenia - resolved PLAN -Patient was noted to become more anemic and complains of fatigue and lightheadedness and dizziness. -I discussed with the patient and his legal guardian and option for a PRBC transfusion and informed consent was obtained for PRBC transfusion for symptomatically anemia. -We also discussed the drop in his hemoglobin and the recommendation for consideration of a bone marrow biopsy. An informed consent was obtained from the patient and his legal guardian regarding proceeding with bone marrow biopsy and orders were placed for this. -continue multivitamin and B complex. Patient/legal guardian note compliance with this.  -PRBC transfusion 1 unit in 1-2 days CT bone marrow biopsy in 3-4 days RTC with Dr Irene Limbo 4-5 days after Bone marrow biopsy with labs  All of the patients questions were answered with apparent satisfaction. The patient knows to call the clinic with any problems, questions or concerns.  I spent 20 minutes counseling the patient face to face. The total time spent in the appointment was 25 minutes and more than 50% was on counseling and direct patient cares.    Sullivan Lone MD [place] AAHIVMS [facility] Hematology/Oncology Physician [facility]  (Office):       [phone] (Work cell):  [phone] (Fax):           [phone]

## 2016-09-30 NOTE — Progress Notes (Signed)
[name]    HEMATOLOGY/ONCOLOGY CLINIC NOTE  Date of Service: .09/20/2016  Patient Care Team: Marletta Lor, MD as PCP - General  CHIEF COMPLAINTS/PURPOSE OF CONSULTATION:  Macrocytic anemia  HISTORY OF PRESENTING ILLNESS:   Reginald Daniels is a wonderful 71 y.o. male who has been referred to [place] by Dr .Burnice Logan, Doretha Sou, MD for evaluation and management of macrocytic anemia.  Patient has a h/o mental retardation (has a legal guardia -[name] who was present for visit), prostate cancer (managed by urology, s/p brachy RT with seeds), chronic macrocytosis with anemia and leukopenia.  He recently on 12/28/2015 has labs with his PCP including CBC which showed severe anemia with hgb of 7.8 MCV 123, nl PLT 185k, mild leucopenia 3.4k.  He has had significant macrocytosis since atleast 2013 but possibly longer/lifelong. However previously had hgb of 12.1 in 02/2013 and 13.2 in 05/2011. He has had wbc count of 2.7-3.4k in this time period.  Patient is a limited historian. Notes no significant dizziness or lightheadedness. Some fatigue. No weight loss. Night sweats. No bone pains. Denies any other new urinary symptoms. He is not reporting any other focal symptoms. He is legal guardian does not suggest any other complaints.   INTERVAL HISTORY  Reginald Daniels is here for followup for his macrocytic anemia.  He had an uneventful PRBC transfusion for symptomatic anemia and notes that he is feeling better. He has still not had his planned BM Bx and this has been scheduled for 10/01/2016. No other acute new symptoms.  MEDICAL HISTORY:  Past Medical History:  Diagnosis Date  . Cancer [facility])    prostate  . Leukopenia   . Macrocytosis   . Mental retardation   . Thrombocytopenia ([place])     SURGICAL HISTORY: status post seed implantation for prostate cancer  bilateral inguinal herniorrhaphies   SOCIAL HISTORY: Social History   Social History  . Marital status: Married    Spouse  name: N/A  . Number of children: N/A  . Years of education: N/A   Occupational History  . Not on file.   Social History Main Topics  . Smoking status: Never Smoker  . Smokeless tobacco: Never Used  . Alcohol use No  . Drug use: No  . Sexual activity: Not on file   Other Topics Concern  . Not on file   Social History Narrative  . No narrative on file  Never Smoked  patient has never been employed due to mental retardation  he lives with a court appointed guardian - [name] Smoking Status: never  FAMILY HISTORY:  father died at age 24  mother died young. A suicide death  No siblings   ALLERGIES:  has No Known Allergies.  MEDICATIONS:  Current Outpatient Prescriptions  Medication Sig Dispense Refill  . diphenhydrAMINE (BENADRYL ALLERGY) 25 mg capsule Take 50 mg by mouth every 6 (six) hours as needed.    . Multiple Vitamin (MULTIVITAMIN) tablet Take 1 tablet by mouth daily.       No current facility-administered medications for this visit.     REVIEW OF SYSTEMS:    10 Point review of Systems was done is negative except as noted above.  PHYSICAL EXAMINATION: ECOG PERFORMANCE STATUS: 1 - Symptomatic but completely ambulatory  . Vitals:   09/20/16 0907  BP: (!) 106/47  Pulse: 73  Resp: 18  Temp: 97.9 F (36.6 C)  SpO2: 100%   Filed Weights   09/20/16 0907  Weight: 138 lb 1.6 oz (  62.6 kg)   .Body mass index is 26.09 kg/m.  GENERAL:alert, in no acute distress and comfortable, appears pale SKIN: no acute rashes EYES: Conjunctivae with pallor no scleral icterus OROPHARYNX:no exudate, no erythema and lips, buccal mucosa, and tongue normal  NECK: supple, no JVD, thyroid normal size, non-tender, without nodularity LYMPH:  no palpable lymphadenopathy in the cervical, axillary or inguinal LUNGS: clear to auscultation with normal respiratory effort HEART: regular rate & rhythm,  no murmurs and no lower extremity edema ABDOMEN: abdomen soft, non-tender,  normoactive bowel sounds  Musculoskeletal: no cyanosis of digits and no clubbing  PSYCH: alert , significant cognitive issues due to h/o mental retardation NEURO: no focal motor/sensory deficits  LABORATORY DATA:  I have reviewed the data as listed  .[name] CBC Latest Ref Rng & Units 09/20/2016 09/13/2016 04/26/2016  WBC 4.0 - 10.3 10e3/uL 3.3(L) 5.3 4.6  Hemoglobin 13.0 - 17.1 g/dL 9.4(L) 8.0(L) 9.6(L)  Hematocrit 38.4 - 49.9 % 29.0(L) 24.8(L) 29.1(L)  Platelets 140 - 400 10e3/uL 74(L) 138(L) 148     . CMP Latest Ref Rng & Units 09/20/2016 09/13/2016 04/26/2016  Glucose 70 - 140 mg/dl 84 91 86  BUN 7.0 - 26.0 mg/dL 17.3 16.9 20.8  Creatinine 0.7 - 1.3 mg/dL 1.3 1.3 1.4(H)  Sodium 136 - 145 mEq/L 140 140 142  Potassium 3.5 - 5.1 mEq/L 4.4 4.2 4.3  Chloride 96 - 112 mEq/L - - -  CO2 22 - 29 mEq/L 23 21(L) 24  Calcium 8.4 - 10.4 mg/dL 9.7 9.5 9.6  Total Protein 6.4 - 8.3 g/dL 7.2 7.2 7.5  Total Bilirubin 0.20 - 1.20 mg/dL 1.04 1.04 0.95  Alkaline Phos 40 - 150 U/L 74 73 79  AST 5 - 34 U/L 15 17 16   ALT 0 - 55 U/L 10 13 10    RADIOGRAPHIC STUDIES: I have personally reviewed the radiological images as listed and agreed with the findings in the report. No results found.  ASSESSMENT & PLAN:   71 year old very pleasant gentleman with mental retardation here with his legal guardian for evaluation of   1) Severe Macrocytic Anemia  Hemoglobin of 7.8 on 12/28/2015. Appears to be improving to 8.4 then to 8.9 and now upto 9.6 with improving MCV and RDW while on multivitamins and B complex. Multiple myeloma panel and serum free light chains within normal limits TSH within normal limits LDH and haptoglobin suggest no evidence of hemolysis B12 and calcium levels within normal limits. He has had recent evaluation by his urologist which suggested his prostate cancer has been stable. Could potentially be a vitamin B responsive macrocytosis Cannot rule out MDS Hgb was down to 8 on last visit with  symptoms of anemia. S/P PRBC x 1 unit with improvement in his Hgb to 9.4  2) Leukopenia - resolved  3) Thrombocytopenia - worsening PLT were down to 134k on his last week and is down to 74k today. No evidence of abnormal bruising or bleeding.  PLAN -patient tolerated PRBC transfusion without any issues and is feeling better. -discussed lab results . Will need to monitor dropping platelet counts. CT bone marrow biopsy not yet done - scheduled for 8/13 -plan of care discussed with and approved by Reginald Daniels - patients legal guardian.  RTC with Dr Irene Limbo 7-10 days after [facility] Bx with labs    All of the patients questions were answered with apparent satisfaction. The patient knows to call the clinic with any problems, questions or concerns.  I spent 15 minutes counseling  the patient face to face. The total time spent in the appointment was 15 minutes and more than 50% was on counseling and direct patient cares.    Sullivan Lone MD [place] AAHIVMS [facility] Hematology/Oncology Physician [facility]  (Office):       [phone] (Work cell):  [phone] (Fax):           [phone]

## 2016-10-01 ENCOUNTER — Ambulatory Visit (HOSPITAL_COMMUNITY)
Admission: RE | Admit: 2016-10-01 | Discharge: 2016-10-01 | Disposition: A | Discharge status: Home / Self Care | Payer: Medicare Other | Source: Ambulatory Visit | Attending: Hematology | Admitting: Hematology

## 2016-10-01 ENCOUNTER — Encounter (HOSPITAL_COMMUNITY): Payer: Self-pay

## 2016-10-01 DIAGNOSIS — F79 Unspecified intellectual disabilities: Secondary | ICD-10-CM | POA: Insufficient documentation

## 2016-10-01 DIAGNOSIS — Z8546 Personal history of malignant neoplasm of prostate: Secondary | ICD-10-CM | POA: Insufficient documentation

## 2016-10-01 DIAGNOSIS — Z79899 Other long term (current) drug therapy: Secondary | ICD-10-CM | POA: Diagnosis not present

## 2016-10-01 DIAGNOSIS — D61818 Other pancytopenia: Secondary | ICD-10-CM | POA: Insufficient documentation

## 2016-10-01 DIAGNOSIS — D539 Nutritional anemia, unspecified: Secondary | ICD-10-CM | POA: Diagnosis not present

## 2016-10-01 DIAGNOSIS — D7589 Other specified diseases of blood and blood-forming organs: Secondary | ICD-10-CM | POA: Diagnosis not present

## 2016-10-01 DIAGNOSIS — D696 Thrombocytopenia, unspecified: Secondary | ICD-10-CM

## 2016-10-01 DIAGNOSIS — D649 Anemia, unspecified: Secondary | ICD-10-CM | POA: Diagnosis not present

## 2016-10-01 DIAGNOSIS — D509 Iron deficiency anemia, unspecified: Secondary | ICD-10-CM | POA: Diagnosis not present

## 2016-10-01 LAB — CBC
HEMATOCRIT: 25.6 % — AB (ref 39.0–52.0)
HEMOGLOBIN: 8.6 g/dL — AB (ref 13.0–17.0)
MCH: 37.4 pg — AB (ref 26.0–34.0)
MCHC: 33.6 g/dL (ref 30.0–36.0)
MCV: 111.3 fL — ABNORMAL HIGH (ref 78.0–100.0)
Platelets: 113 10*3/uL — ABNORMAL LOW (ref 150–400)
RBC: 2.3 MIL/uL — AB (ref 4.22–5.81)
RDW: 22.7 % — ABNORMAL HIGH (ref 11.5–15.5)
WBC: 4.3 10*3/uL (ref 4.0–10.5)

## 2016-10-01 LAB — PROTIME-INR
INR: 1.07
Prothrombin Time: 13.9 seconds (ref 11.4–15.2)

## 2016-10-01 LAB — APTT: aPTT: 33 seconds (ref 24–36)

## 2016-10-01 MED ORDER — FENTANYL CITRATE (PF) 100 MCG/2ML IJ SOLN
INTRAMUSCULAR | Status: AC | PRN
  Administered 2016-10-01: 25 ug via INTRAVENOUS

## 2016-10-01 MED ORDER — MIDAZOLAM HCL 2 MG/2ML IJ SOLN
INTRAMUSCULAR | Status: AC
  Filled 2016-10-01: qty 4

## 2016-10-01 MED ORDER — MIDAZOLAM HCL 2 MG/2ML IJ SOLN
INTRAMUSCULAR | Status: AC | PRN
  Administered 2016-10-01 (×2): 0.5 mg via INTRAVENOUS

## 2016-10-01 MED ORDER — FENTANYL CITRATE (PF) 100 MCG/2ML IJ SOLN
INTRAMUSCULAR | Status: AC
  Filled 2016-10-01: qty 4

## 2016-10-01 MED ORDER — SODIUM CHLORIDE 0.9 % IV SOLN
INTRAVENOUS | Status: DC
  Administered 2016-10-01: 09:00:00 via INTRAVENOUS

## 2016-10-01 MED ORDER — LIDOCAINE-EPINEPHRINE (PF) 1 %-1:200000 IJ SOLN
INTRAMUSCULAR | Status: AC | PRN
  Administered 2016-10-01: 10 mL

## 2016-10-01 NOTE — Consult Note (Signed)
Chief Complaint: Patient was seen in consultation today for CT guided bone marrow biopsy  Referring Physician(s): Brunetta Genera  Supervising Physician: Sandi Mariscal  Patient Status: [facility] - Out-pt  History of Present Illness: Reginald Daniels is a 71 y.o. male with history of mental retardation, prostate cancer and progressive macrocytic anemia with pancytopenia. He presents today for CT-guided bone marrow biopsy to rule out MDS versus infiltrative bone marrow process.  Past Medical History:  Diagnosis Date  . Cancer [facility])    prostate  . Leukopenia   . Macrocytosis   . Mental retardation   . Thrombocytopenia ([place])     History reviewed. No pertinent surgical history.  Allergies: Patient has no known allergies.  Medications: Prior to Admission medications   Medication Sig Start Date End Date Taking? Authorizing Provider  b complex vitamins capsule Take 1 capsule by mouth daily. 09/30/16  Yes Brunetta Genera, MD  cetirizine (ZYRTEC) 10 MG tablet Take 10 mg by mouth daily.   Yes [provider]  diphenhydrAMINE (BENADRYL ALLERGY) 25 mg capsule Take 50 mg by mouth every 6 (six) hours as needed.    [provider]  Multiple Vitamin (MULTIVITAMIN) tablet Take 1 tablet by mouth daily.      [provider]     History reviewed. No pertinent family history.  Social History   Social History  . Marital status: Married    Spouse name: N/A  . Number of children: N/A  . Years of education: N/A   Social History Main Topics  . Smoking status: Never Smoker  . Smokeless tobacco: Never Used  . Alcohol use No  . Drug use: No  . Sexual activity: Not Asked   Other Topics Concern  . None   Social History Narrative  . None      Review of Systems denies fever, headache, chest pain, dyspnea, cough, abdominal pain, back pain, nausea, vomiting, bleeding. Does have some fatigue.  Vital Signs: Vitals:   10/01/16 0830  BP: 112/66  Pulse:  79  Resp: 18  Temp: 97.9 F (36.6 C)  SpO2: 100%      Physical Exam awake, answers questions appropriately. Chest clear to auscultation bilat; Heart with regular rate and rhythm; abdomen soft, positive bowel sounds, nontender; LE edema noted  Mallampati Score:     Imaging: No results found.  Labs:  CBC:  Recent Labs  03/01/16 1252 04/26/16 1027 09/13/16 0937 09/20/16 0841  WBC 4.7 4.6 5.3 3.3*  HGB 8.9* 9.6* 8.0* 9.4*  HCT 27.1* 29.1* 24.8* 29.0*  PLT 140 148 138* 74*    COAGS: No results for input(s): INR, APTT in the last 8760 hours.  BMP:  Recent Labs  12/28/15 1019  02/02/16 0846 04/26/16 1028 09/13/16 0937 09/20/16 0841  NA 139  < > 139 142 140 140  K 4.2  < > 4.1 4.3 4.2 4.4  CL 105  --   --   --   --   --   CO2 28  < > 22 24 21* 23  GLUCOSE 90  < > 80 86 91 84  BUN 22  < > 15.3 20.8 16.9 17.3  CALCIUM 9.6  < > 9.1 9.6 9.5 9.7  CREATININE 1.46  < > 1.3 1.4* 1.3 1.3  < > = values in this interval not displayed.  LIVER FUNCTION TESTS:  Recent Labs  02/02/16 0846 04/26/16 1028 09/13/16 0937 09/20/16 0841  BILITOT 0.72 0.95 1.04 1.04  AST 14  16 17 15   ALT 12 10 13 10   ALKPHOS 73 79 73 74  PROT 7.3 7.5 7.2 7.2  ALBUMIN 3.5 3.8 3.6 3.6    TUMOR MARKERS: No results for input(s): AFPTM, CEA, CA199, CHROMGRNA in the last 8760 hours.  Assessment and Plan: 71 y.o. male with history of mental retardation, prostate cancer and progressive macrocytic anemia with pancytopenia. He presents today for CT-guided bone marrow biopsy to rule out MDS versus infiltrative bone marrow process.Risks and benefits discussed with the patient/legal guardian including, but not limited to bleeding, infection, damage to adjacent structures or low yield requiring additional tests.All of the patient's questions were answered, patient is agreeable to proceed.Consent signed and in chart.     Thank you for this interesting consult.  I greatly enjoyed meeting OVAL CAVAZOS and look forward to participating in their care.  A copy of this report was sent to the requesting provider on this date.  Electronically Signed: D. Rowe Leodan, PA-C 10/01/2016, 9:21 AM   I spent a total of 20 minutes  in face to face in clinical consultation, greater than 50% of which was counseling/coordinating care for CT guided bone marrow biopsy

## 2016-10-01 NOTE — Discharge Instructions (Signed)
Bone Marrow Aspiration and Bone Marrow Biopsy, Adult, Care After °This sheet gives you information about how to care for yourself after your procedure. Your health care provider may also give you more specific instructions. If you have problems or questions, contact your health care provider. °What can I expect after the procedure? °After the procedure, it is common to have: °· Mild pain and tenderness. °· Swelling. °· Bruising. ° °Follow these instructions at home: °· Take over-the-counter or prescription medicines only as told by your health care provider. °· Do not take baths, swim, or use a hot tub until your health care provider approves. Ask if you can take a shower or have a sponge bath. °· Follow instructions from your health care provider about how to take care of the puncture site. Make sure you: °? Wash your hands with soap and water before you change your bandage (dressing). If soap and water are not available, use hand sanitizer. °? Change your dressing as told by your health care provider. °· Check your puncture site every day for signs of infection. Check for: °? More redness, swelling, or pain. °? More fluid or blood. °? Warmth. °? Pus or a bad smell. °· Return to your normal activities as told by your health care provider. Ask your health care provider what activities are safe for you. °· Do not drive for 24 hours if you were given a medicine to help you relax (sedative). °· Keep all follow-up visits as told by your health care provider. This is important. °Contact a health care provider if: °· You have more redness, swelling, or pain around the puncture site. °· You have more fluid or blood coming from the puncture site. °· Your puncture site feels warm to the touch. °· You have pus or a bad smell coming from the puncture site. °· You have a fever. °· Your pain is not controlled with medicine. °This information is not intended to replace advice given to you by your health care provider. Make sure  you discuss any questions you have with your health care provider. °Document Released: 08/25/2004 Document Revised: 08/26/2015 Document Reviewed: 07/20/2015 °Elsevier Interactive Patient Education © 2018 Elsevier Inc. °Moderate Conscious Sedation, Adult, Care After °These instructions provide you with information about caring for yourself after your procedure. Your health care provider may also give you more specific instructions. Your treatment has been planned according to current medical practices, but problems sometimes occur. Call your health care provider if you have any problems or questions after your procedure. °What can I expect after the procedure? °After your procedure, it is common: °· To feel sleepy for several hours. °· To feel clumsy and have poor balance for several hours. °· To have poor judgment for several hours. °· To vomit if you eat too soon. ° °Follow these instructions at home: °For at least 24 hours after the procedure: ° °· Do not: °? Participate in activities where you could fall or become injured. °? Drive. °? Use heavy machinery. °? Drink alcohol. °? Take sleeping pills or medicines that cause drowsiness. °? Make important decisions or sign legal documents. °? Take care of children on your own. °· Rest. °Eating and drinking °· Follow the diet recommended by your health care provider. °· If you vomit: °? Drink water, juice, or soup when you can drink without vomiting. °? Make sure you have little or no nausea before eating solid foods. °General instructions °· Have a responsible adult stay with you until you are   awake and alert.  Take over-the-counter and prescription medicines only as told by your health care provider.  If you smoke, do not smoke without supervision.  Keep all follow-up visits as told by your health care provider. This is important. Contact a health care provider if:  You keep feeling nauseous or you keep vomiting.  You feel light-headed.  You develop a  rash.  You have a fever. Get help right away if:  You have trouble breathing. This information is not intended to replace advice given to you by your health care provider. Make sure you discuss any questions you have with your health care provider. Document Released: 11/26/2012 Document Revised: 07/11/2015 Document Reviewed: 05/28/2015 Elsevier Interactive Patient Education  [employer].

## 2016-10-01 NOTE — Procedures (Signed)
Pre-procedure Diagnosis: Macrocystic anemia Post-procedure Diagnosis: Same  Technically successful CT guided bone marrow aspiration and biopsy of left iliac crest.   Complications: None Immediate  EBL: None  SignedSandi Mariscal Pager: [phone] 10/01/2016, 11:23 AM

## 2016-10-09 DIAGNOSIS — D469 Myelodysplastic syndrome, unspecified: Secondary | ICD-10-CM | POA: Diagnosis not present

## 2016-10-10 ENCOUNTER — Ambulatory Visit (HOSPITAL_BASED_OUTPATIENT_CLINIC_OR_DEPARTMENT_OTHER): Payer: Medicare Other | Admitting: Hematology

## 2016-10-10 ENCOUNTER — Other Ambulatory Visit (HOSPITAL_BASED_OUTPATIENT_CLINIC_OR_DEPARTMENT_OTHER): Payer: Medicare Other

## 2016-10-10 ENCOUNTER — Encounter: Payer: Self-pay | Admitting: Hematology

## 2016-10-10 ENCOUNTER — Ambulatory Visit (HOSPITAL_BASED_OUTPATIENT_CLINIC_OR_DEPARTMENT_OTHER): Payer: Medicare Other

## 2016-10-10 ENCOUNTER — Telehealth: Payer: Self-pay | Admitting: Hematology

## 2016-10-10 VITALS — BP 110/53 | HR 70 | Temp 98.6°F | Resp 18 | Ht 61.0 in | Wt 137.1 lb

## 2016-10-10 DIAGNOSIS — D696 Thrombocytopenia, unspecified: Secondary | ICD-10-CM | POA: Diagnosis not present

## 2016-10-10 DIAGNOSIS — D539 Nutritional anemia, unspecified: Secondary | ICD-10-CM

## 2016-10-10 DIAGNOSIS — D6182 Myelophthisis: Secondary | ICD-10-CM

## 2016-10-10 DIAGNOSIS — D72819 Decreased white blood cell count, unspecified: Secondary | ICD-10-CM

## 2016-10-10 DIAGNOSIS — D469 Myelodysplastic syndrome, unspecified: Secondary | ICD-10-CM

## 2016-10-10 LAB — COMPREHENSIVE METABOLIC PANEL
ALT: 11 U/L (ref 0–55)
ANION GAP: 7 meq/L (ref 3–11)
AST: 16 U/L (ref 5–34)
Albumin: 3.3 g/dL — ABNORMAL LOW (ref 3.5–5.0)
Alkaline Phosphatase: 71 U/L (ref 40–150)
BILIRUBIN TOTAL: 0.85 mg/dL (ref 0.20–1.20)
BUN: 16.9 mg/dL (ref 7.0–26.0)
CO2: 25 meq/L (ref 22–29)
Calcium: 9.4 mg/dL (ref 8.4–10.4)
Chloride: 110 mEq/L — ABNORMAL HIGH (ref 98–109)
Creatinine: 1.3 mg/dL (ref 0.7–1.3)
EGFR: 53 mL/min/{1.73_m2} — AB (ref >90)
Glucose: 78 mg/dl (ref 70–140)
POTASSIUM: 4.2 meq/L (ref 3.5–5.1)
Sodium: 141 mEq/L (ref 136–145)
TOTAL PROTEIN: 6.9 g/dL (ref 6.4–8.3)

## 2016-10-10 LAB — MANUAL DIFFERENTIAL
ALC: 0.4 10*3/uL — ABNORMAL LOW (ref 0.9–3.3)
ANC (CHCC manual diff): 2 10*3/uL (ref 1.5–6.5)
BAND NEUTROPHILS: 4 % (ref 0–10)
BASOPHIL: 1 % (ref 0–2)
Blasts: 5 % — ABNORMAL HIGH (ref 0–0)
EOS: 1 % (ref 0–7)
LYMPH: 13 % — ABNORMAL LOW (ref 14–49)
METAMYELOCYTES PCT: 2 % — AB (ref 0–0)
MONO: 13 % (ref 0–14)
MYELOCYTES: 1 % — AB (ref 0–0)
OTHER CELL: 0 % (ref 0–0)
PLT EST: DECREASED
PROMYELO: 0 % (ref 0–0)
SEG: 60 % (ref 38–77)
Variant Lymph: 0 % (ref 0–0)
nRBC: 1 % — ABNORMAL HIGH (ref 0–0)

## 2016-10-10 LAB — IRON AND TIBC
%SAT: 50 % (ref 20–55)
Iron: 121 ug/dL (ref 42–163)
TIBC: 243 ug/dL (ref 202–409)
UIBC: 121 ug/dL (ref 117–376)

## 2016-10-10 LAB — CBC & DIFF AND RETIC
HCT: 24.7 % — ABNORMAL LOW (ref 38.4–49.9)
HGB: 8 g/dL — ABNORMAL LOW (ref 13.0–17.1)
Immature Retic Fract: 12.2 % — ABNORMAL HIGH (ref 3.00–10.60)
MCH: 36.9 pg — ABNORMAL HIGH (ref 27.2–33.4)
MCHC: 32.4 g/dL (ref 32.0–36.0)
MCV: 113.8 fL — ABNORMAL HIGH (ref 79.3–98.0)
Platelets: 100 10*3/uL — ABNORMAL LOW (ref 140–400)
RBC: 2.17 10*6/uL — AB (ref 4.20–5.82)
RDW: 23.7 % — AB (ref 11.0–14.6)
RETIC %: 3.75 % — AB (ref 0.80–1.80)
RETIC CT ABS: 81.38 10*3/uL (ref 34.80–93.90)
WBC: 2.9 10*3/uL — ABNORMAL LOW (ref 4.0–10.3)

## 2016-10-10 LAB — FERRITIN: Ferritin: 259 ng/mL (ref 22–316)

## 2016-10-10 NOTE — Telephone Encounter (Signed)
Gave patient avs and calendar for appts in September.

## 2016-10-10 NOTE — Patient Instructions (Signed)
Thank you for choosing [facility] to provide your oncology and hematology care.  To afford each patient quality time with our providers, please arrive 30 minutes before your scheduled appointment time.  If you arrive late for your appointment, you may be asked to reschedule.  We strive to give you quality time with our providers, and arriving late affects you and other patients whose appointments are after yours.   If you are a no show for multiple scheduled visits, you may be dismissed from the clinic at the providers discretion.    Again, thank you for choosing [facility], our hope is that these requests will decrease the amount of time that you wait before being seen by our physicians.  ______________________________________________________________________  Should you have questions after your visit to the [facility], please contact our office at [phone] between the hours of 8:30 and 4:30 p.m.    Voicemails left after 4:30p.m will not be returned until the following business day.    For prescription refill requests, please have your pharmacy contact us directly.  Please also try to allow 48 hours for prescription requests.    Please contact the scheduling department for questions regarding scheduling.  For scheduling of procedures such as PET scans, CT scans, MRI, Ultrasound, etc please contact central scheduling at [phone].    Resources For Cancer Patients and Caregivers:   Oncolink.org:  A wonderful resource for patients and healthcare providers for information regarding your disease, ways to tract your treatment, what to expect, etc.     American Cancer Society:  800-227-2345  Can help patients locate various types of support and financial assistance  Cancer Care: 1-800-813-HOPE (4673) Provides financial assistance, online support groups, medication/co-pay assistance.    [place] DSS:  336-641-3447 Where to apply for food  stamps, Medicaid, and utility assistance  Medicare Rights Center: 800-333-4114 Helps people with Medicare understand their rights and benefits, navigate the Medicare system, and secure the quality healthcare they deserve  SCAT: 336-333-6589 [place] [employer]'s shared-ride transportation service for eligible riders who have a disability that prevents them from riding the fixed route bus.    For additional information on assistance programs please contact our social worker:   Grier Hock/Abigail Elmore:  [phone]            

## 2016-10-11 LAB — ERYTHROPOIETIN: Erythropoietin: 108.1 m[IU]/mL — ABNORMAL HIGH (ref 2.6–18.5)

## 2016-10-16 NOTE — Progress Notes (Signed)
[name]    HEMATOLOGY/ONCOLOGY CLINIC NOTE  Date of Service: .10/10/2016  Patient Care Team: Marletta Lor, MD as PCP - General  CHIEF COMPLAINTS/PURPOSE OF CONSULTATION:  Macrocytic anemia  HISTORY OF PRESENTING ILLNESS:   Reginald Daniels is a wonderful 71 y.o. male who has been referred to [place] by Dr .Burnice Logan, Doretha Sou, MD for evaluation and management of macrocytic anemia.  Patient has a h/o mental retardation (has a legal guardia -[name] who was present for visit), prostate cancer (managed by urology, s/p brachy RT with seeds), chronic macrocytosis with anemia and leukopenia.  He recently on 12/28/2015 has labs with his PCP including CBC which showed severe anemia with hgb of 7.8 MCV 123, nl PLT 185k, mild leucopenia 3.4k.  He has had significant macrocytosis since atleast 2013 but possibly longer/lifelong. However previously had hgb of 12.1 in 02/2013 and 13.2 in 05/2011. He has had wbc count of 2.7-3.4k in this time period.  Patient is a limited historian. Notes no significant dizziness or lightheadedness. Some fatigue. No weight loss. Night sweats. No bone pains. Denies any other new urinary symptoms. He is not reporting any other focal symptoms. He is legal guardian does not suggest any other complaints.   INTERVAL HISTORY  Reginald Daniels is here for followup for hisBone marrow biopsy results. He is accompanied by his legal guardian Reginald Daniels. BM Bx results discussed in details.  MEDICAL HISTORY:  Past Medical History:  Diagnosis Date  . Cancer [facility])    prostate  . Leukopenia   . Macrocytosis   . Mental retardation   . Thrombocytopenia ([place])     SURGICAL HISTORY: status post seed implantation for prostate cancer  bilateral inguinal herniorrhaphies   SOCIAL HISTORY: Social History   Social History  . Marital status: Married    Spouse name: N/A  . Number of children: N/A  . Years of education: N/A   Occupational History  . Not on file.    Social History Main Topics  . Smoking status: Never Smoker  . Smokeless tobacco: Never Used  . Alcohol use No  . Drug use: No  . Sexual activity: Not on file   Other Topics Concern  . Not on file   Social History Narrative  . No narrative on file  Never Smoked  patient has never been employed due to mental retardation  he lives with a court appointed guardian - [name] Smoking Status: never  FAMILY HISTORY:  father died at age 42  mother died young. A suicide death  No siblings   ALLERGIES:  has No Known Allergies.  MEDICATIONS:  Current Outpatient Prescriptions  Medication Sig Dispense Refill  . b complex vitamins capsule Take 1 capsule by mouth daily.    . cetirizine (ZYRTEC) 10 MG tablet Take 10 mg by mouth daily.    . diphenhydrAMINE (BENADRYL ALLERGY) 25 mg capsule Take 50 mg by mouth every 6 (six) hours as needed.    . Multiple Vitamin (MULTIVITAMIN) tablet Take 1 tablet by mouth daily.       No current facility-administered medications for this visit.     REVIEW OF SYSTEMS:    10 Point review of Systems was done is negative except as noted above.  PHYSICAL EXAMINATION: ECOG PERFORMANCE STATUS: 1 - Symptomatic but completely ambulatory  . Vitals:   10/10/16 0907  BP: (!) 110/53  Pulse: 70  Resp: 18  Temp: 98.6 F (37 C)  SpO2: 100%   Filed Weights   10/10/16  3536  Weight: 137 lb 1.6 oz (62.2 kg)   .Body mass index is 25.9 kg/m.  GENERAL:alert, in no acute distress and comfortable, appears pale SKIN: no acute rashes EYES: Conjunctivae with pallor no scleral icterus OROPHARYNX:no exudate, no erythema and lips, buccal mucosa, and tongue normal  NECK: supple, no JVD, thyroid normal size, non-tender, without nodularity LYMPH:  no palpable lymphadenopathy in the cervical, axillary or inguinal LUNGS: clear to auscultation with normal respiratory effort HEART: regular rate & rhythm,  no murmurs and no lower extremity edema ABDOMEN: abdomen  soft, non-tender, normoactive bowel sounds  Musculoskeletal: no cyanosis of digits and no clubbing  PSYCH: alert , significant cognitive issues due to h/o mental retardation NEURO: no focal motor/sensory deficits  LABORATORY DATA:  I have reviewed the data as listed  .[name] CBC Latest Ref Rng & Units 10/10/2016 10/01/2016 09/20/2016  WBC 4.0 - 10.3 10e3/uL 2.9(L) 4.3 3.3(L)  Hemoglobin 13.0 - 17.1 g/dL 8.0(L) 8.6(L) 9.4(L)  Hematocrit 38.4 - 49.9 % 24.7(L) 25.6(L) 29.0(L)  Platelets 140 - 400 10e3/uL 100(L) 113(L) 74(L)     . CMP Latest Ref Rng & Units 10/10/2016 09/20/2016 09/13/2016  Glucose 70 - 140 mg/dl 78 84 91  BUN 7.0 - 26.0 mg/dL 16.9 17.3 16.9  Creatinine 0.7 - 1.3 mg/dL 1.3 1.3 1.3  Sodium 136 - 145 mEq/L 141 140 140  Potassium 3.5 - 5.1 mEq/L 4.2 4.4 4.2  Chloride 96 - 112 mEq/L - - -  CO2 22 - 29 mEq/L 25 23 21(L)  Calcium 8.4 - 10.4 mg/dL 9.4 9.7 9.5  Total Protein 6.4 - 8.3 g/dL 6.9 7.2 7.2  Total Bilirubin 0.20 - 1.20 mg/dL 0.85 1.04 1.04  Alkaline Phos 40 - 150 U/L 71 74 73  AST 5 - 34 U/L _0 ALT 0 - 55 U/L _1 RADIOGRAPHIC STUDIES: I have personally reviewed the radiological images as listed and agreed with the findings in the report. Ct Biopsy  Result Date: 10/01/2016 INDICATION: Macrocytic anemia of uncertain etiology. Please perform CT-guided bone marrow biopsy for tissue diagnostic purposes. EXAM: CT-GUIDED BONE MARROW BIOPSY AND ASPIRATION MEDICATIONS: None ANESTHESIA/SEDATION: Fentanyl 25 mcg IV; Versed 1 mg IV Sedation Time: 11 minutes; The patient was continuously monitored during the procedure by the interventional radiology nurse under my direct supervision. COMPLICATIONS: None immediate. PROCEDURE: Informed consent was obtained from the patient's legal guardian following an explanation of the procedure, risks, benefits and alternatives. The patient understands, agrees and consents for the procedure. All questions were addressed. A time out was  performed prior to the initiation of the procedure. The patient was positioned prone and non-contrast localization CT was performed of the pelvis to demonstrate the iliac marrow spaces. The operative site was prepped and draped in the usual sterile fashion. Under sterile conditions and local anesthesia, a 22 gauge spinal needle was utilized for procedural planning. Next, an 11 gauge coaxial bone biopsy needle was advanced into the left iliac marrow space. Needle position was confirmed with CT imaging. Initially, bone marrow aspiration was performed. Next, a bone marrow biopsy was obtained with the 11 gauge outer bone marrow device. Samples were prepared with the cytotechnologist and deemed adequate. The needle was removed intact. Hemostasis was obtained with compression and a dressing was placed. The patient tolerated the procedure well without immediate post procedural complication. IMPRESSION: Successful CT guided left iliac bone marrow aspiration and core biopsy. Electronically Signed   By: Eldridge Abrahams.D.  On: 10/01/2016 12:01   Ct Bone Marrow Biopsy & Aspiration  Result Date: 10/01/2016 INDICATION: Macrocytic anemia of uncertain etiology. Please perform CT-guided bone marrow biopsy for tissue diagnostic purposes. EXAM: CT-GUIDED BONE MARROW BIOPSY AND ASPIRATION MEDICATIONS: None ANESTHESIA/SEDATION: Fentanyl 25 mcg IV; Versed 1 mg IV Sedation Time: 11 minutes; The patient was continuously monitored during the procedure by the interventional radiology nurse under my direct supervision. COMPLICATIONS: None immediate. PROCEDURE: Informed consent was obtained from the patient's legal guardian following an explanation of the procedure, risks, benefits and alternatives. The patient understands, agrees and consents for the procedure. All questions were addressed. A time out was performed prior to the initiation of the procedure. The patient was positioned prone and non-contrast localization CT was performed of  the pelvis to demonstrate the iliac marrow spaces. The operative site was prepped and draped in the usual sterile fashion. Under sterile conditions and local anesthesia, a 22 gauge spinal needle was utilized for procedural planning. Next, an 11 gauge coaxial bone biopsy needle was advanced into the left iliac marrow space. Needle position was confirmed with CT imaging. Initially, bone marrow aspiration was performed. Next, a bone marrow biopsy was obtained with the 11 gauge outer bone marrow device. Samples were prepared with the cytotechnologist and deemed adequate. The needle was removed intact. Hemostasis was obtained with compression and a dressing was placed. The patient tolerated the procedure well without immediate post procedural complication. IMPRESSION: Successful CT guided left iliac bone marrow aspiration and core biopsy. Electronically Signed   By: Sandi Mariscal M.D.   On: 10/01/2016 12:01    ASSESSMENT & PLAN:   71 year old very pleasant gentleman with mental retardation here with his legal guardian for evaluation of   1) Severe Macrocytic Anemia  Hemoglobin of 7.8 on 12/28/2015. Appears to be improving to 8.4 then to 8.9 and now upto 9.6 with improving MCV and RDW while on multivitamins and B complex. Multiple myeloma panel and serum free light chains within normal limits TSH within normal limits LDH and haptoglobin suggest no evidence of hemolysis B12 and calcium levels within normal limits. He has had recent evaluation by his urologist which suggested his prostate cancer has been stable.  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 Cytogenetics/FISH panel pending.  Ferritin levels adequate 2) Leukopenia - WBC count  WBC 2.9k ,ANC 2k  3) Thrombocytopenia - worsening PLT at 100k  today. No evidence of abnormal bruising or bleeding.  PLAN -discussed BM Bx results with patient and his legal guardian -hgb is 8 today but patient denies siginificant  symptoms from anemia and declined PRBC transfusion at this time. -will f/u FISH and cytogenetics on BM Bx -Jak2 [id number], BCR-ABL mutation studies -EPO levels. -transfuse prn for hgb <8 or if symptomatic. -will likely need supportive mx with prn PRBC transfusions and consideration for EPO.  RTC with Dr Irene Limbo in 2 weeks with labs   All of the patients questions were answered with apparent satisfaction. The patient knows to call the clinic with any problems, questions or concerns.  I spent 15 minutes counseling the patient face to face. The total time spent in the appointment was 15 minutes and more than 50% was on counseling and direct patient cares.    Reginald Lone MD [place] AAHIVMS [facility] Hematology/Oncology Physician [facility]  (Office):       [phone] (Work cell):  [phone] (Fax):  [phone]

## 2016-10-19 ENCOUNTER — Encounter (HOSPITAL_COMMUNITY): Payer: Self-pay

## 2016-10-25 NOTE — Progress Notes (Signed)
[name]    HEMATOLOGY/ONCOLOGY CLINIC NOTE  Date of Service: 10/26/16  Patient Care Team: Marletta Lor, MD as PCP - General  CHIEF COMPLAINTS/PURPOSE OF CONSULTATION:  Macrocytic anemia  HISTORY OF PRESENTING ILLNESS:   Reginald Daniels is a wonderful 71 y.o. male who has been referred to [place] by Dr .Burnice Logan, Doretha Sou, MD for evaluation and management of macrocytic anemia.  Patient has a h/o mental retardation (has a legal guardia -Reginald Daniels who was present for visit), prostate cancer (managed by urology, s/p brachy RT with seeds), chronic macrocytosis with anemia and leukopenia.  He recently on 12/28/2015 has labs with his PCP including CBC which showed severe anemia with hgb of 7.8 MCV 123, nl PLT 185k, mild leucopenia 3.4k.  He has had significant macrocytosis since atleast 2013 but possibly longer/lifelong. However previously had hgb of 12.1 in 02/2013 and 13.2 in 05/2011. He has had wbc count of 2.7-3.4k in this time period.  Patient is a limited historian. Notes no significant dizziness or lightheadedness. Some fatigue. No weight loss. Night sweats. No bone pains. Denies any other new urinary symptoms. He is not reporting any other focal symptoms. He is legal guardian does not suggest any other complaints.  CURRENT THERAPY:150 mcg Aranesp every 2 weeks   INTERVAL HISTORY Patient is here for f/u of his lab results and BM Bx results. Of note, since the patient last visit, he underwent a bone marrow biopsy on 10/01/2016 with results revealing: Bone Marrow, Aspirate, Biopsy, and Clot, left iliac crest. Hypercellular bone marrow for age with erythroid and megakaryocytic proliferation associated with fibrosis. peripheral blood: macrocytic anemia. Leukoerythroblastic reaction. Thrombocytopenia.   Reginald Daniels is here for followup and he is accompanied by his legal guardian Reginald Daniels. He reports that he is doing well overall. He reports that he would like to avoid a blood  transfusion at this time. Pt states that he is comfortable with injections to alleviate his anemia at this time.   On review of systems, denies lightheadedness, dizziness, fatigue. Denies pain.    MEDICAL HISTORY:  Past Medical History:  Diagnosis Date  . Cancer [facility])    prostate  . Leukopenia   . Macrocytosis   . Mental retardation   . Thrombocytopenia ([place])     SURGICAL HISTORY: status post seed implantation for prostate cancer  bilateral inguinal herniorrhaphies   SOCIAL HISTORY: Social History   Social History  . Marital status: Married    Spouse name: N/A  . Number of children: N/A  . Years of education: N/A   Occupational History  . Not on file.   Social History Main Topics  . Smoking status: Never Smoker  . Smokeless tobacco: Never Used  . Alcohol use No  . Drug use: No  . Sexual activity: Not on file   Other Topics Concern  . Not on file   Social History Narrative  . No narrative on file  Never Smoked  patient has never been employed due to mental retardation  he lives with a court appointed guardian - Reginald Daniels Smoking Status: never  FAMILY HISTORY:  father died at age 52  mother died young. A suicide death  No siblings   ALLERGIES:  has No Known Allergies.  MEDICATIONS:  Current Outpatient Prescriptions  Medication Sig Dispense Refill  . b complex vitamins capsule Take 1 capsule by mouth daily.    . cetirizine (ZYRTEC) 10 MG tablet Take 10 mg by mouth daily.    . diphenhydrAMINE (BENADRYL  ALLERGY) 25 mg capsule Take 50 mg by mouth every 6 (six) hours as needed.    . Multiple Vitamin (MULTIVITAMIN) tablet Take 1 tablet by mouth daily.       No current facility-administered medications for this visit.     REVIEW OF SYSTEMS:    10 Point review of Systems was done is negative except as noted above.  PHYSICAL EXAMINATION:  ECOG PERFORMANCE STATUS: 1 - Symptomatic but completely ambulatory  . Vitals:   10/26/16 1047  BP: (!) 82/67    Pulse: 75  Resp: 18  Temp: 97.7 F (36.5 C)  SpO2: 100%   Filed Weights   10/26/16 1047  Weight: 137 lb 4.8 oz (62.3 kg)   .Body mass index is 25.94 kg/m.  GENERAL:alert, in no acute distress and comfortable, appears pale SKIN: no acute rashes EYES: Conjunctivae with pallor no scleral icterus OROPHARYNX:no exudate, no erythema and lips, buccal mucosa, and tongue normal  NECK: supple, no JVD, thyroid normal size, non-tender, without nodularity LYMPH:  no palpable lymphadenopathy in the cervical, axillary or inguinal LUNGS: clear to auscultation with normal respiratory effort HEART: regular rate & rhythm,  no murmurs and no lower extremity edema ABDOMEN: abdomen soft, non-tender, normoactive bowel sounds , no clinically palpable hepato-splenomegaly. Musculoskeletal: no cyanosis of digits and no clubbing  PSYCH: alert , significant cognitive issues due to h/o mental retardation NEURO: no focal motor/sensory deficits  LABORATORY DATA:  I have reviewed the data as listed  .[name] CBC Latest Ref Rng & Units 10/26/2016 10/10/2016 10/01/2016  WBC 4.0 - 10.3 10e3/uL 3.2(L) 2.9(L) 4.3  Hemoglobin 13.0 - 17.1 g/dL 7.7(L) 8.0(L) 8.6(L)  Hematocrit 38.4 - 49.9 % 23.8(L) 24.7(L) 25.6(L)  Platelets 140 - 400 10e3/uL 87(L) 100(L) 113(L)     . CMP Latest Ref Rng & Units 10/26/2016 10/10/2016 09/20/2016  Glucose 70 - 140 mg/dl 91 78 84  BUN 7.0 - 26.0 mg/dL 21.1 16.9 17.3  Creatinine 0.7 - 1.3 mg/dL 1.4(H) 1.3 1.3  Sodium 136 - 145 mEq/L 139 141 140  Potassium 3.5 - 5.1 mEq/L 4.2 4.2 4.4  Chloride 96 - 112 mEq/L - - -  CO2 22 - 29 mEq/L 24 25 23   Calcium 8.4 - 10.4 mg/dL 9.4 9.4 9.7  Total Protein 6.4 - 8.3 g/dL 6.7 6.9 7.2  Total Bilirubin 0.20 - 1.20 mg/dL 1.22(H) 0.85 1.04  Alkaline Phos 40 - 150 U/L 73 71 74  AST 5 - 34 U/L 16 16 15   ALT 0 - 55 U/L 9 11 10    Component     Latest Ref Rng & Units 10/10/2016  Erythropoietin     2.6 - 18.5 mIU/mL 108.1 (H)              RADIOGRAPHIC STUDIES: I have personally reviewed the radiological images as listed and agreed with the findings in the report. Ct Biopsy  Result Date: 10/01/2016 INDICATION: Macrocytic anemia of uncertain etiology. Please perform CT-guided bone marrow biopsy for tissue diagnostic purposes. EXAM: CT-GUIDED BONE MARROW BIOPSY AND ASPIRATION MEDICATIONS: None ANESTHESIA/SEDATION: Fentanyl 25 mcg IV; Versed 1 mg IV Sedation Time: 11 minutes; The patient was continuously monitored during the procedure by the interventional radiology nurse under my direct supervision. COMPLICATIONS: None immediate. PROCEDURE: Informed consent was obtained from the patient's legal guardian following an explanation of the procedure, risks, benefits and alternatives. The patient understands, agrees and consents for the procedure. All questions were addressed. A time out was performed prior to the initiation of the procedure.  The patient was positioned prone and non-contrast localization CT was performed of the pelvis to demonstrate the iliac marrow spaces. The operative site was prepped and draped in the usual sterile fashion. Under sterile conditions and local anesthesia, a 22 gauge spinal needle was utilized for procedural planning. Next, an 11 gauge coaxial bone biopsy needle was advanced into the left iliac marrow space. Needle position was confirmed with CT imaging. Initially, bone marrow aspiration was performed. Next, a bone marrow biopsy was obtained with the 11 gauge outer bone marrow device. Samples were prepared with the cytotechnologist and deemed adequate. The needle was removed intact. Hemostasis was obtained with compression and a dressing was placed. The patient tolerated the procedure well without immediate post procedural complication. IMPRESSION: Successful CT guided left iliac bone marrow aspiration and core biopsy. Electronically Signed   By: Sandi Mariscal M.D.   On: 10/01/2016 12:01   Ct Bone  Marrow Biopsy & Aspiration  Result Date: 10/01/2016 INDICATION: Macrocytic anemia of uncertain etiology. Please perform CT-guided bone marrow biopsy for tissue diagnostic purposes. EXAM: CT-GUIDED BONE MARROW BIOPSY AND ASPIRATION MEDICATIONS: None ANESTHESIA/SEDATION: Fentanyl 25 mcg IV; Versed 1 mg IV Sedation Time: 11 minutes; The patient was continuously monitored during the procedure by the interventional radiology nurse under my direct supervision. COMPLICATIONS: None immediate. PROCEDURE: Informed consent was obtained from the patient's legal guardian following an explanation of the procedure, risks, benefits and alternatives. The patient understands, agrees and consents for the procedure. All questions were addressed. A time out was performed prior to the initiation of the procedure. The patient was positioned prone and non-contrast localization CT was performed of the pelvis to demonstrate the iliac marrow spaces. The operative site was prepped and draped in the usual sterile fashion. Under sterile conditions and local anesthesia, a 22 gauge spinal needle was utilized for procedural planning. Next, an 11 gauge coaxial bone biopsy needle was advanced into the left iliac marrow space. Needle position was confirmed with CT imaging. Initially, bone marrow aspiration was performed. Next, a bone marrow biopsy was obtained with the 11 gauge outer bone marrow device. Samples were prepared with the cytotechnologist and deemed adequate. The needle was removed intact. Hemostasis was obtained with compression and a dressing was placed. The patient tolerated the procedure well without immediate post procedural complication. IMPRESSION: Successful CT guided left iliac bone marrow aspiration and core biopsy. Electronically Signed   By: Sandi Mariscal M.D.   On: 10/01/2016 12:01    ASSESSMENT & PLAN:   70 year old very pleasant gentleman with mental retardation here with his legal guardian for evaluation of   1)  Severe Macrocytic Anemia  Hemoglobin of 7.8 on 12/28/2015. Hemoglobin of 7.7 as of 10/26/16.  Previously, his Hgb appeared to be improving to 8.4 then to 8.9 and now up to 9.6 with improving MCV and RDW while on multivitamins and B complex. Multiple myeloma panel and serum free light chains within normal limits TSH within normal limits LDH and haptoglobin suggest no evidence of hemolysis B12 and calcium levels within normal limits. He has had recent evaluation by his urologist which suggested his prostate cancer has been stable.  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 Cytogenetics- trisomy 8 -- FISH panel -pending Jak2 neg  BCR-ABL neg  Ferritin levels adequate EPO levels 108  Plan -I discussed bone marrow results with patient and his legal guardian again during today's visit including the available cytogenetics and Jak2 and BCR-ABL and EPO  results. -Pt will be started on Aranesp for use every 2 weeks for MDS to see if he responds well to Aranesp and to decrease the need for a blood transfusion.  -transfuse prbc for hgb<7 or if symptomatic -Will have the patient follow up for Aranesp injection in 1 week -Will monitor labs every 2 weeks -RTC with Dr. Irene Limbo in 4 weeks.   2) Leukopenia - WBC count .  WBC 2.9k, ANC 2k as of 10/10/16 WBC 3.2k as of 10/26/16. ANC 1.8k  3) Thrombocytopenia - worsening PLT at 100k  as of 10/10/2016.  -PLT at 87 as of 10/26/2016. No evidence of abnormal bruising or bleeding.  PLAN -Will have the patient follow up for Aranesp injection in 1 week -Will monitor labs every 2 weeks -patient and his guardian were recommended to call us if they note increasing fatigue or dizziness of DOE.  RTC in 5 days to start Aranesp shots q2weeks Labs q2weeks RTC with Dr Irene Limbo in 4 weeks   All of the patients questions were answered with apparent satisfaction. The patient knows to call the clinic with any problems, questions or  concerns.  I spent 20 minutes counseling the patient face to face. The total time spent in the appointment was 25 minutes and more than 50% was on counseling and direct patient cares.    Sullivan Lone MD [place] AAHIV[facility] Hematology/Oncology Physician [place]  (Office):       [phone] (Work cell):  [phone] (Fax):           [phone]  This document serves as a record of services personally performed by Sullivan Lone, MD. It was created on her behalf by Steva Colder, a trained medical scribe. The creation of this record is based on the scribe's personal observations and the provider's statements to them. This document has been checked and approved by the attending provider.

## 2016-10-26 ENCOUNTER — Telehealth: Payer: Self-pay

## 2016-10-26 ENCOUNTER — Ambulatory Visit (HOSPITAL_BASED_OUTPATIENT_CLINIC_OR_DEPARTMENT_OTHER): Payer: Medicare Other | Admitting: Hematology

## 2016-10-26 ENCOUNTER — Other Ambulatory Visit (HOSPITAL_BASED_OUTPATIENT_CLINIC_OR_DEPARTMENT_OTHER): Payer: Medicare Other

## 2016-10-26 ENCOUNTER — Encounter: Payer: Self-pay | Admitting: Hematology

## 2016-10-26 VITALS — BP 82/67 | HR 75 | Temp 97.7°F | Resp 18 | Wt 137.3 lb

## 2016-10-26 DIAGNOSIS — D72819 Decreased white blood cell count, unspecified: Secondary | ICD-10-CM | POA: Diagnosis not present

## 2016-10-26 DIAGNOSIS — D469 Myelodysplastic syndrome, unspecified: Secondary | ICD-10-CM

## 2016-10-26 DIAGNOSIS — D696 Thrombocytopenia, unspecified: Secondary | ICD-10-CM | POA: Diagnosis not present

## 2016-10-26 DIAGNOSIS — D539 Nutritional anemia, unspecified: Secondary | ICD-10-CM | POA: Diagnosis not present

## 2016-10-26 DIAGNOSIS — D7581 Myelofibrosis: Secondary | ICD-10-CM

## 2016-10-26 DIAGNOSIS — D6182 Myelophthisis: Secondary | ICD-10-CM

## 2016-10-26 LAB — CBC & DIFF AND RETIC
BASO%: 0.6 % (ref 0.0–2.0)
BASOS ABS: 0 10*3/uL (ref 0.0–0.1)
EOS ABS: 0 10*3/uL (ref 0.0–0.5)
EOS%: 0.3 % (ref 0.0–7.0)
HCT: 23.8 % — ABNORMAL LOW (ref 38.4–49.9)
HEMOGLOBIN: 7.7 g/dL — AB (ref 13.0–17.1)
Immature Retic Fract: 13.5 % — ABNORMAL HIGH (ref 3.00–10.60)
LYMPH%: 17.4 % (ref 14.0–49.0)
MCH: 37.2 pg — AB (ref 27.2–33.4)
MCHC: 32.4 g/dL (ref 32.0–36.0)
MCV: 115 fL — ABNORMAL HIGH (ref 79.3–98.0)
MONO#: 0.8 10*3/uL (ref 0.1–0.9)
MONO%: 25.6 % — AB (ref 0.0–14.0)
NEUT#: 1.8 10*3/uL (ref 1.5–6.5)
NEUT%: 56.1 % (ref 39.0–75.0)
NRBC: 2 % — AB (ref 0–0)
PLATELETS: 87 10*3/uL — AB (ref 140–400)
RBC: 2.07 10*6/uL — ABNORMAL LOW (ref 4.20–5.82)
RDW: 25 % — AB (ref 11.0–14.6)
Retic %: 5.3 % — ABNORMAL HIGH (ref 0.80–1.80)
Retic Ct Abs: 109.71 10*3/uL — ABNORMAL HIGH (ref 34.80–93.90)
WBC: 3.2 10*3/uL — AB (ref 4.0–10.3)
lymph#: 0.6 10*3/uL — ABNORMAL LOW (ref 0.9–3.3)

## 2016-10-26 LAB — COMPREHENSIVE METABOLIC PANEL
ALBUMIN: 3.4 g/dL — AB (ref 3.5–5.0)
ALT: 9 U/L (ref 0–55)
ANION GAP: 6 meq/L (ref 3–11)
AST: 16 U/L (ref 5–34)
Alkaline Phosphatase: 73 U/L (ref 40–150)
BILIRUBIN TOTAL: 1.22 mg/dL — AB (ref 0.20–1.20)
BUN: 21.1 mg/dL (ref 7.0–26.0)
CALCIUM: 9.4 mg/dL (ref 8.4–10.4)
CO2: 24 meq/L (ref 22–29)
CREATININE: 1.4 mg/dL — AB (ref 0.7–1.3)
Chloride: 109 mEq/L (ref 98–109)
EGFR: 49 mL/min/{1.73_m2} — ABNORMAL LOW (ref >90)
GLUCOSE: 91 mg/dL (ref 70–140)
Potassium: 4.2 mEq/L (ref 3.5–5.1)
Sodium: 139 mEq/L (ref 136–145)
TOTAL PROTEIN: 6.7 g/dL (ref 6.4–8.3)

## 2016-10-26 LAB — TECHNOLOGIST REVIEW: Technologist Review: 7

## 2016-10-26 NOTE — Patient Instructions (Signed)
Thank you for choosing [facility] to provide your oncology and hematology care.  To afford each patient quality time with our providers, please arrive 30 minutes before your scheduled appointment time.  If you arrive late for your appointment, you may be asked to reschedule.  We strive to give you quality time with our providers, and arriving late affects you and other patients whose appointments are after yours.   If you are a no show for multiple scheduled visits, you may be dismissed from the clinic at the providers discretion.    Again, thank you for choosing [facility], our hope is that these requests will decrease the amount of time that you wait before being seen by our physicians.  ______________________________________________________________________  Should you have questions after your visit to the [facility], please contact our office at [phone] between the hours of 8:30 and 4:30 p.m.    Voicemails left after 4:30p.m will not be returned until the following business day.    For prescription refill requests, please have your pharmacy contact us directly.  Please also try to allow 48 hours for prescription requests.    Please contact the scheduling department for questions regarding scheduling.  For scheduling of procedures such as PET scans, CT scans, MRI, Ultrasound, etc please contact central scheduling at [phone].    Resources For Cancer Patients and Caregivers:   Oncolink.org:  A wonderful resource for patients and healthcare providers for information regarding your disease, ways to tract your treatment, what to expect, etc.     American Cancer Society:  800-227-2345  Can help patients locate various types of support and financial assistance  Cancer Care: 1-800-813-HOPE (4673) Provides financial assistance, online support groups, medication/co-pay assistance.    [place] DSS:  336-641-3447 Where to apply for food  stamps, Medicaid, and utility assistance  Medicare Rights Center: 800-333-4114 Helps people with Medicare understand their rights and benefits, navigate the Medicare system, and secure the quality healthcare they deserve  SCAT: 336-333-6589 [place] [employer]'s shared-ride transportation service for eligible riders who have a disability that prevents them from riding the fixed route bus.    For additional information on assistance programs please contact our social worker:   Grier Hock/Abigail Elmore:  [phone]            

## 2016-10-26 NOTE — Telephone Encounter (Signed)
Gave patient avs and calender for September, and October per 9/7 los

## 2016-10-31 ENCOUNTER — Ambulatory Visit: Payer: Medicare Other

## 2016-10-31 ENCOUNTER — Other Ambulatory Visit: Payer: Medicare Other

## 2016-11-01 ENCOUNTER — Telehealth: Payer: Self-pay

## 2016-11-01 ENCOUNTER — Other Ambulatory Visit: Payer: Self-pay

## 2016-11-01 DIAGNOSIS — C61 Malignant neoplasm of prostate: Secondary | ICD-10-CM

## 2016-11-01 NOTE — Telephone Encounter (Signed)
Error

## 2016-11-02 ENCOUNTER — Ambulatory Visit (HOSPITAL_BASED_OUTPATIENT_CLINIC_OR_DEPARTMENT_OTHER): Payer: Medicare Other

## 2016-11-02 ENCOUNTER — Other Ambulatory Visit (HOSPITAL_BASED_OUTPATIENT_CLINIC_OR_DEPARTMENT_OTHER): Payer: Medicare Other

## 2016-11-02 VITALS — BP 105/53 | HR 74 | Temp 97.5°F | Resp 18

## 2016-11-02 DIAGNOSIS — C61 Malignant neoplasm of prostate: Secondary | ICD-10-CM

## 2016-11-02 DIAGNOSIS — D469 Myelodysplastic syndrome, unspecified: Secondary | ICD-10-CM

## 2016-11-02 DIAGNOSIS — D7581 Myelofibrosis: Secondary | ICD-10-CM

## 2016-11-02 DIAGNOSIS — D6182 Myelophthisis: Secondary | ICD-10-CM

## 2016-11-02 LAB — COMPREHENSIVE METABOLIC PANEL
ALBUMIN: 3.3 g/dL — AB (ref 3.5–5.0)
ALK PHOS: 68 U/L (ref 40–150)
ALT: 9 U/L (ref 0–55)
ANION GAP: 8 meq/L (ref 3–11)
AST: 16 U/L (ref 5–34)
BILIRUBIN TOTAL: 0.98 mg/dL (ref 0.20–1.20)
BUN: 20.5 mg/dL (ref 7.0–26.0)
CALCIUM: 9.8 mg/dL (ref 8.4–10.4)
CO2: 24 mEq/L (ref 22–29)
Chloride: 109 mEq/L (ref 98–109)
Creatinine: 1.4 mg/dL — ABNORMAL HIGH (ref 0.7–1.3)
EGFR: 50 mL/min/{1.73_m2} — AB (ref >90)
Glucose: 88 mg/dl (ref 70–140)
POTASSIUM: 4.3 meq/L (ref 3.5–5.1)
SODIUM: 141 meq/L (ref 136–145)
TOTAL PROTEIN: 6.8 g/dL (ref 6.4–8.3)

## 2016-11-02 LAB — CBC & DIFF AND RETIC
BASO%: 1 % (ref 0.0–2.0)
Basophils Absolute: 0 10*3/uL (ref 0.0–0.1)
EOS ABS: 0 10*3/uL (ref 0.0–0.5)
EOS%: 0.3 % (ref 0.0–7.0)
HCT: 23.2 % — ABNORMAL LOW (ref 38.4–49.9)
HEMOGLOBIN: 7.5 g/dL — AB (ref 13.0–17.1)
IMMATURE RETIC FRACT: 13.5 % — AB (ref 3.00–10.60)
LYMPH%: 20.3 % (ref 14.0–49.0)
MCH: 37.5 pg — ABNORMAL HIGH (ref 27.2–33.4)
MCHC: 32.3 g/dL (ref 32.0–36.0)
MCV: 116 fL — AB (ref 79.3–98.0)
MONO#: 0.8 10*3/uL (ref 0.1–0.9)
MONO%: 26.2 % — AB (ref 0.0–14.0)
NEUT#: 1.6 10*3/uL (ref 1.5–6.5)
NEUT%: 52.2 % (ref 39.0–75.0)
NRBC: 5 % — AB (ref 0–0)
PLATELETS: 86 10*3/uL — AB (ref 140–400)
RBC: 2 10*6/uL — ABNORMAL LOW (ref 4.20–5.82)
RDW: 24.7 % — ABNORMAL HIGH (ref 11.0–14.6)
RETIC CT ABS: 98 10*3/uL — AB (ref 34.80–93.90)
Retic %: 4.9 % — ABNORMAL HIGH (ref 0.80–1.80)
WBC: 3 10*3/uL — ABNORMAL LOW (ref 4.0–10.3)
lymph#: 0.6 10*3/uL — ABNORMAL LOW (ref 0.9–3.3)

## 2016-11-02 LAB — TECHNOLOGIST REVIEW

## 2016-11-02 MED ORDER — DARBEPOETIN ALFA 150 MCG/0.3ML IJ SOSY
150.0000 ug | PREFILLED_SYRINGE | Freq: Once | INTRAMUSCULAR | Status: AC
  Administered 2016-11-02: 150 ug via SUBCUTANEOUS
  Filled 2016-11-02: qty 0.3

## 2016-11-02 NOTE — Patient Instructions (Signed)

## 2016-11-12 ENCOUNTER — Encounter (HOSPITAL_COMMUNITY): Payer: Self-pay

## 2016-11-12 LAB — CHROMOSOME ANALYSIS, BONE MARROW

## 2016-11-13 LAB — TISSUE HYBRIDIZATION (BONE MARROW)-NCBH

## 2016-11-14 ENCOUNTER — Ambulatory Visit: Payer: Medicare Other

## 2016-11-14 ENCOUNTER — Ambulatory Visit (INDEPENDENT_AMBULATORY_CARE_PROVIDER_SITE_OTHER): Payer: Medicare Other | Admitting: Internal Medicine

## 2016-11-14 ENCOUNTER — Encounter: Payer: Self-pay | Admitting: Internal Medicine

## 2016-11-14 ENCOUNTER — Other Ambulatory Visit: Payer: Medicare Other

## 2016-11-14 VITALS — BP 108/52 | HR 80 | Temp 97.8°F | Ht 61.0 in | Wt 136.6 lb

## 2016-11-14 DIAGNOSIS — D469 Myelodysplastic syndrome, unspecified: Secondary | ICD-10-CM | POA: Diagnosis not present

## 2016-11-14 DIAGNOSIS — H6123 Impacted cerumen, bilateral: Secondary | ICD-10-CM

## 2016-11-14 DIAGNOSIS — R197 Diarrhea, unspecified: Secondary | ICD-10-CM | POA: Diagnosis not present

## 2016-11-14 MED ORDER — DIPHENOXYLATE-ATROPINE 2.5-0.025 MG PO TABS: 1.0000 | ORAL_TABLET | Freq: Four times a day (QID) | ORAL | 0 refills | Status: DC | PRN

## 2016-11-14 NOTE — Progress Notes (Signed)
Subjective:    Patient ID: Reginald Daniels, male    DOB: 1945/09/15, 71 y.o.   MRN: [id number]  HPI  Wt Readings from Last 3 Encounters:  11/14/16 136 lb 9.6 oz (62 kg)  10/26/16 137 lb 4.8 oz (62.3 kg)  10/10/16 137 lb 1.6 oz (62.63 kg)    71 year old patient who is seen today for follow-up. He is followed closely by hematology due to megaloblastic anemia with leukopenia, thrombocytopenia.  Evaluation has included a recent bone marrow biopsy which revealed a hypercellular bone marrow associated with some fibrosis.  Presently he is on Aranesp injections.  Patient continues to tolerate his anemia well.  He did have a fall recently but this occurred on an uneven surface when he lost his balance.  His main concern today is frequent stools present for the past 3 or 4 weeks.  He states that he has 3-4 loose stools daily, often associated with incontinence No weight loss    Past Medical History:  Diagnosis Date  . Cancer [facility])    prostate  . Leukopenia   . Macrocytosis   . Mental retardation   . Thrombocytopenia [facility])      Social History   Social History  . Marital status: Married    Spouse name: N/A  . Number of children: N/A  . Years of education: N/A   Occupational History  . Not on file.   Social History Main Topics  . Smoking status: Never Smoker  . Smokeless tobacco: Never Used  . Alcohol use No  . Drug use: No  . Sexual activity: Not on file   Other Topics Concern  . Not on file   Social History Narrative  . No narrative on file    No past surgical history on file.  No family history on file.  No Known Allergies  Current Outpatient Prescriptions on File Prior to Visit  Medication Sig Dispense Refill  . b complex vitamins capsule Take 1 capsule by mouth daily.    . cetirizine (ZYRTEC) 10 MG tablet Take 10 mg by mouth daily.    . diphenhydrAMINE (BENADRYL ALLERGY) 25 mg capsule Take 50 mg by mouth every 6 (six) hours as needed.    . Multiple  Vitamin (MULTIVITAMIN) tablet Take 1 tablet by mouth daily.       No current facility-administered medications on file prior to visit.     BP (!) 108/52 (BP Location: Left Arm, Patient Position: Sitting, Cuff Size: Normal)   Pulse 80   Temp 97.8 F (36.6 C) (Oral)   Ht _0  (1.549 m)   Wt 136 lb 9.6 oz (62 kg)   SpO2 97%   BMI 25.81 kg/m    Review of Systems  Constitutional: Negative for appetite change, chills, fatigue and fever.  HENT: Negative for congestion, dental problem, ear pain, hearing loss, sore throat, tinnitus, trouble swallowing and voice change.   Eyes: Negative for pain, discharge and visual disturbance.  Respiratory: Negative for cough, chest tightness, wheezing and stridor.   Cardiovascular: Negative for chest pain, palpitations and leg swelling.  Gastrointestinal: Positive for diarrhea. Negative for abdominal distention, abdominal pain, blood in stool, constipation, nausea and vomiting.  Genitourinary: Negative for difficulty urinating, discharge, flank pain, genital sores, hematuria and urgency.  Musculoskeletal: Negative for arthralgias, back pain, gait problem, joint swelling, myalgias and neck stiffness.  Skin: Negative for rash.  Neurological: Negative for dizziness, syncope, speech difficulty, weakness, numbness and headaches.  Hematological: Negative for adenopathy. Does not  bruise/bleed easily.  Psychiatric/Behavioral: Negative for behavioral problems and dysphoric mood. The patient is not nervous/anxious.        Objective:   Physical Exam  Constitutional: He is oriented to person, place, and time. He appears well-developed.  HENT:  Head: Normocephalic.  Right Ear: External ear normal.  Left Ear: External ear normal.  Eyes: Conjunctivae and EOM are normal.  Neck: Normal range of motion.  Cardiovascular: Normal rate and normal heart sounds.   Pulmonary/Chest: Breath sounds normal.  Abdominal: Soft. Bowel sounds are normal.  Limited exam due to  inability to relax  Musculoskeletal: Normal range of motion. He exhibits no edema or tenderness.  Neurological: He is alert and oriented to person, place, and time.  Psychiatric: He has a normal mood and affect. His behavior is normal.          Assessment & Plan:   Diarrhea.  Will place on a high-fiber diet and challenges with a short-term lactose-free diet.  If possible.  Due to the incontinence will also treat with short-term Lomotil  Follow-up 4 weeks  Cerumen impaction.  Both canals irrigated until clear  Reginald Daniels

## 2016-11-14 NOTE — Patient Instructions (Addendum)
High-Fiber Diet Fiber, also called dietary fiber, is a type of carbohydrate found in fruits, vegetables, whole grains, and beans. A high-fiber diet can have many health benefits. Your health care provider may recommend a high-fiber diet to help:  Prevent constipation. Fiber can make your bowel movements more regular.  Lower your cholesterol.  Relieve hemorrhoids, uncomplicated diverticulosis, or irritable bowel syndrome.  Prevent overeating as part of a weight-loss plan.  Prevent heart disease, type 2 diabetes, and certain cancers.  What is my plan? The recommended daily intake of fiber includes:  38 grams for men under age 1.  4 grams for men over age 89.  35 grams for women under age 69.  82 grams for women over age 76.  You can get the recommended daily intake of dietary fiber by eating a variety of fruits, vegetables, grains, and beans. Your health care provider may also recommend a fiber supplement if it is not possible to get enough fiber through your diet. What do I need to know about a high-fiber diet?  Fiber supplements have not been widely studied for their effectiveness, so it is better to get fiber through food sources.  Always check the fiber content on thenutrition facts label of any prepackaged food. Look for foods that contain at least 5 grams of fiber per serving.  Ask your dietitian if you have questions about specific foods that are related to your condition, especially if those foods are not listed in the following section.  Increase your daily fiber consumption gradually. Increasing your intake of dietary fiber too quickly may cause bloating, cramping, or gas.  Drink plenty of water. Water helps you to digest fiber. What foods can I eat? Grains Whole-grain breads. Multigrain cereal. Oats and oatmeal. Brown rice. Barley. Bulgur wheat. [place]. Bran muffins. Popcorn. Rye wafer crackers. Vegetables Sweet potatoes. Spinach. Kale. Artichokes. Cabbage.  Broccoli. Green peas. Carrots. Squash. Fruits Berries. Pears. Apples. Oranges. Avocados. Prunes and raisins. Dried figs. Meats and Other Protein Sources [place], kidney, pinto, and soy beans. Split peas. Lentils. Nuts and seeds. Dairy Fiber-fortified yogurt. Beverages Fiber-fortified soy milk. Fiber-fortified orange juice. Other Fiber bars. The items listed above may not be a complete list of recommended foods or beverages. Contact your dietitian for more options. What foods are not recommended? Grains White bread. Pasta made with refined flour. White rice. Vegetables Fried potatoes. Canned vegetables. Well-cooked vegetables. Fruits Fruit juice. Cooked, strained fruit. Meats and Other Protein Sources Fatty cuts of meat. Fried Sales executive or fried fish. Dairy Milk. Yogurt. Cream cheese. Sour cream. Beverages Soft drinks. Other Cakes and pastries. Butter and oils. The items listed above may not be a complete list of foods and beverages to avoid. Contact your dietitian for more information. What are some tips for including high-fiber foods in my diet?  Eat a wide variety of high-fiber foods.  Make sure that half of all grains consumed each day are whole grains.  Replace breads and cereals made from refined flour or white flour with whole-grain breads and cereals.  Replace white rice with brown rice, bulgur wheat, or millet.  Start the day with a breakfast that is high in fiber, such as a cereal that contains at least 5 grams of fiber per serving.  Use beans in place of meat in soups, salads, or pasta.  Eat high-fiber snacks, such as berries, raw vegetables, nuts, or popcorn. This information is not intended to replace advice given to you by your health care provider. Make sure you discuss any  questions you have with your health care provider. Document Released: 02/05/2005 Document Revised: 07/14/2015 Document Reviewed: 07/21/2013 Elsevier Interactive Patient Education  2017  Elsevier Inc. Lactose-Free Diet, Adult If you have lactose intolerance, you are not able to digest lactose. Lactose is a natural sugar found mainly in milk and milk products. You may need to avoid all foods and beverages that contain lactose. A lactose-free diet can help you do this. What do I need to know about this diet?  Do not consume foods, beverages, vitamins, minerals, or medicines with lactose. Read ingredients lists carefully.  Look for the words "lactose-free" on labels.  Use lactase enzyme drops or tablets as directed by your health care provider.  Use lactose-free milk or a milk alternative, such as soy milk, for drinking and cooking.  Make sure you get enough calcium and vitamin D in your diet. A lactose-free eating plan can be lacking in these important nutrients.  Take calcium and vitamin D supplements as directed by your health care provider. Talk to your provider about supplements if you are not able to get enough calcium and vitamin D from food. Which foods have lactose? Lactose is found in:  Milk and foods made from milk.  Yogurt.  Cheese.  Butter.  Margarine.  Sour cream.  Cream.  Whipped toppings and nondairy creamers.  Ice cream and other milk-based desserts.  Lactose is also found in foods or products made with milk or milk ingredients. To find out whether a food contains milk or a milk ingredient, look at the ingredients list. Avoid foods with the statement "May contain milk" and foods that contain:  Butter.  Cream.  Milk.  Milk solids.  Milk powder.  [place]hey.  Curd.  Caseinate.  Lactose.  Lactalbumin.  Lactoglobulin.  What are some alternatives to milk and foods made with milk products?  Lactose-free milk.  Soy milk with added calcium and vitamin D.  Almond, coconut, or rice milk with added calcium and vitamin D. Note that these are low in protein.  Soy products, such as soy yogurt, soy cheese, soy ice cream, and soy-based  sour cream. Which foods can I eat? Grains Breads and rolls made without milk, such as [place], [place], or [place] bread, bagels, pita, and [employer]. Corn tortillas, corn meal, grits, and polenta. Crackers without lactose or milk solids, such as soda crackers and graham crackers. Cooked or dry cereals without lactose or milk solids. Pasta, quinoa, couscous, barley, oats, bulgur, farro, rice, wild rice, or other grains prepared without milk or lactose. Plain popcorn. Vegetables Fresh, frozen, and canned vegetables without cheese, cream, or butter sauces. Fruits All fresh, canned, frozen, or dried fruits that are not processed with lactose. Meats and Other Protein Sources Plain beef, chicken, fish, [place], lamb, veal, pork, wild game, or ham. Kosher-prepared meat products. Strained or junior meats that do not contain milk. Eggs. Soy meat substitutes. Beans, lentils, and hummus. Tofu. Nuts and seeds. Peanut or other nut butters without lactose. Soups, casseroles, and mixed dishes without cheese, cream, or milk. Dairy Lactose-free milk. Soy, rice, or almond milk with added calcium and vitamin D. Soy cheese and yogurt. Beverages Carbonated drinks. Tea. Coffee, freeze-dried coffee, and some instant coffees. Fruit and vegetable juices. Condiments Soy sauce. Carob powder. Olives. Gravy made with water. Baker's cocoa. Angie Fava. Pure seasonings and spices. Ketchup. Mustard. Bouillon. Broth. Sweets and Desserts Water and fruit ices. Gelatin. Cookies, pies, or cakes made from allowed ingredients, such as angel food cake. Pudding made with water or  a milk substitute. Lactose-free tofu desserts. Soy, coconut milk, or rice-milk-based frozen desserts. Sugar. Honey. Jam, jelly, and marmalade. Molasses. Pure sugar candy. Dark chocolate without milk. Marshmallows. Fats and Oils Margarines and salad dressings that do not contain milk. Berniece Salines. Vegetable oils. Shortening. Mayonnaise. Soy or coconut-based cream. The items  listed above may not be a complete list of recommended foods or beverages. Contact your dietitian for more options. Which foods are not recommended? Grains Breads and rolls that contain milk. Toaster pastries. Muffins, biscuits, waffles, cornbread, and pancakes. These can be prepared at home, commercial, or from mixes. Sweet rolls, donuts, English muffins, fry bread, lefse, flour tortillas with lactose, or [place] toast made with milk or milk ingredients. Crackers that contain lactose. Corn curls. Cooked or dry cereals with lactose. Vegetables Creamed or breaded vegetables. Vegetables in a cheese or butter sauce or with lactose-containing margarines. Instant potatoes. [place] fries. Scalloped or au gratin potatoes. Fruits None. Meats and Other Protein Sources Scrambled eggs, omelets, and souffles that contain milk. Creamed or breaded meat, fish, chicken, or [place]. Sausage products, such as wieners and liver sausage. Cold cuts that contain milk solids. Cheese, cottage cheese, ricotta cheese, and cheese spreads. Lasagna and macaroni and cheese. Pizza. Peanut or other nut butters with added milk solids. Casseroles or mixed dishes containing milk or cheese. Dairy All dairy products, including milk, goat's milk, buttermilk, kefir, acidophilus milk, flavored milk, evaporated milk, condensed milk, dulce de [place], eggnog, yogurt, cheese, and cheese spreads. Beverages Hot chocolate. Cocoa with lactose. Instant iced teas. Powdered fruit drinks. Smoothies made with milk or yogurt. Condiments Chewing gum that has lactose. Cocoa that has lactose. Spice blends if they contain milk products. Artificial sweeteners that contain lactose. Nondairy creamers. Sweets and Desserts Ice cream, ice milk, gelato, sherbet, and frozen yogurt. Custard, pudding, and mousse. Cake, cream pies, cookies, and other desserts containing milk, cream, cream cheese, or milk chocolate. Pie crust made with milk-containing margarine or butter.  Reduced-calorie desserts made with a sugar substitute that contains lactose. Toffee and butterscotch. Milk, white, or dark chocolate that contains milk. Fudge. Caramel. Fats and Oils Margarines and salad dressings that contain milk or cheese. Cream. Half and half. Cream cheese. Sour cream. Chip dips made with sour cream or yogurt. The items listed above may not be a complete list of foods and beverages to avoid. Contact your dietitian for more information. Am I getting enough calcium? Calcium is found in many foods that contain lactose and is important for bone health. The amount of calcium you need depends on your age:  Adults younger than 50 years: 1000 mg of calcium a day.  Adults older than 50 years: 1200 mg of calcium a day.  If you are not getting enough calcium, other calcium sources include:  Orange juice with calcium added. There are 300-350 mg of calcium in 1 cup of orange juice.  Sardines with edible bones. There are 325 mg of calcium in 3 oz of sardines.  Calcium-fortified soy milk. There are 300-400 mg of calcium in 1 cup of calcium-fortified soy milk.  Calcium-fortified rice or almond milk. There are 300 mg of calcium in 1 cup of calcium-fortified rice or almond milk.  Canned salmon with edible bones. There are 180 mg of calcium in 3 oz of canned salmon with edible bones.  Calcium-fortified breakfast cereals. There are [phone] mg of calcium in calcium-fortified breakfast cereals.  Tofu set with calcium sulfate. There are 250 mg of calcium in  cup of tofu set  with calcium sulfate.  Spinach, cooked. There are 145 mg of calcium in  cup of cooked spinach.  Edamame, cooked. There are 130 mg of calcium in  cup of cooked edamame.  Collard greens, cooked. There are 125 mg of calcium in  cup of cooked collard greens.  Kale, frozen or cooked. There are 90 mg of calcium in  cup of cooked or frozen kale.  Almonds. There are 95 mg of calcium in  cup of almonds.  Broccoli,  cooked. There are 60 mg of calcium in 1 cup of cooked broccoli.  This information is not intended to replace advice given to you by your health care provider. Make sure you discuss any questions you have with your health care provider. Document Released: 07/28/2001 Document Revised: 07/14/2015 Document Reviewed: 05/08/2013 Elsevier Interactive Patient Education  2018 [employer].

## 2016-11-16 ENCOUNTER — Telehealth: Payer: Self-pay | Admitting: Hematology

## 2016-11-16 ENCOUNTER — Ambulatory Visit (HOSPITAL_BASED_OUTPATIENT_CLINIC_OR_DEPARTMENT_OTHER): Payer: Medicare Other

## 2016-11-16 ENCOUNTER — Other Ambulatory Visit (HOSPITAL_BASED_OUTPATIENT_CLINIC_OR_DEPARTMENT_OTHER): Payer: Medicare Other

## 2016-11-16 ENCOUNTER — Other Ambulatory Visit: Payer: Self-pay

## 2016-11-16 ENCOUNTER — Ambulatory Visit (HOSPITAL_COMMUNITY)
Admission: RE | Admit: 2016-11-16 | Discharge: 2016-11-16 | Disposition: A | Discharge status: Home / Self Care | Payer: Medicare Other | Source: Ambulatory Visit | Attending: Hematology | Admitting: Hematology

## 2016-11-16 VITALS — BP 105/51 | HR 70 | Temp 97.8°F | Resp 18

## 2016-11-16 DIAGNOSIS — D469 Myelodysplastic syndrome, unspecified: Secondary | ICD-10-CM

## 2016-11-16 DIAGNOSIS — D649 Anemia, unspecified: Secondary | ICD-10-CM

## 2016-11-16 DIAGNOSIS — D6182 Myelophthisis: Secondary | ICD-10-CM

## 2016-11-16 DIAGNOSIS — D7581 Myelofibrosis: Secondary | ICD-10-CM

## 2016-11-16 LAB — CBC & DIFF AND RETIC
HCT: 21.6 % — ABNORMAL LOW (ref 38.4–49.9)
HEMOGLOBIN: 7 g/dL — AB (ref 13.0–17.1)
Immature Retic Fract: 22.7 % — ABNORMAL HIGH (ref 3.00–10.60)
MCH: 38.5 pg — AB (ref 27.2–33.4)
MCHC: 32.4 g/dL (ref 32.0–36.0)
MCV: 118.7 fL — ABNORMAL HIGH (ref 79.3–98.0)
Platelets: 55 10*3/uL — ABNORMAL LOW (ref 140–400)
RBC: 1.82 10*6/uL — ABNORMAL LOW (ref 4.20–5.82)
RDW: 25 % — AB (ref 11.0–14.6)
RETIC %: 6.15 % — AB (ref 0.80–1.80)
Retic Ct Abs: 111.93 10*3/uL — ABNORMAL HIGH (ref 34.80–93.90)
WBC: 3 10*3/uL — ABNORMAL LOW (ref 4.0–10.3)

## 2016-11-16 LAB — MANUAL DIFFERENTIAL
ALC: 0.5 10*3/uL — AB (ref 0.9–3.3)
ANC (CHCC MAN DIFF): 1.6 10*3/uL (ref 1.5–6.5)
BASOPHIL: 0 % (ref 0–2)
Band Neutrophils: 0 % (ref 0–10)
Blasts: 10 % — ABNORMAL HIGH (ref 0–0)
EOS%: 1 % (ref 0–7)
LYMPH: 16 % (ref 14–49)
METAMYELOCYTES PCT: 2 % — AB (ref 0–0)
MONO: 20 % — ABNORMAL HIGH (ref 0–14)
Myelocytes: 1 % — ABNORMAL HIGH (ref 0–0)
Other Cell: 0 % (ref 0–0)
PLT EST: DECREASED
PROMYELO: 0 % (ref 0–0)
SEG: 50 % (ref 38–77)
Variant Lymph: 0 % (ref 0–0)
nRBC: 3 % — ABNORMAL HIGH (ref 0–0)

## 2016-11-16 MED ORDER — DARBEPOETIN ALFA 150 MCG/0.3ML IJ SOSY
150.0000 ug | PREFILLED_SYRINGE | Freq: Once | INTRAMUSCULAR | Status: AC
  Administered 2016-11-16: 150 ug via SUBCUTANEOUS
  Filled 2016-11-16: qty 0.3

## 2016-11-16 NOTE — Telephone Encounter (Signed)
Left message re 9/29 PRBC's appointments. Desk nurse also s/w legal guardian.

## 2016-11-16 NOTE — Progress Notes (Signed)
Okay to give aranesp with Hgb 7.0. Porsche, LPN to ask pt if willing to get blood. Will communicate with pt further if he is in agreement.

## 2016-11-17 ENCOUNTER — Ambulatory Visit: Payer: Medicare Other

## 2016-11-17 DIAGNOSIS — D649 Anemia, unspecified: Secondary | ICD-10-CM | POA: Diagnosis not present

## 2016-11-17 LAB — PREPARE RBC (CROSSMATCH)

## 2016-11-17 MED ORDER — ACETAMINOPHEN 325 MG PO TABS
650.0000 mg | ORAL_TABLET | Freq: Once | ORAL | Status: AC
  Administered 2016-11-17: 650 mg via ORAL

## 2016-11-17 MED ORDER — SODIUM CHLORIDE 0.9 % IV SOLN
250.0000 mL | Freq: Once | INTRAVENOUS | Status: AC
  Administered 2016-11-17: 250 mL via INTRAVENOUS

## 2016-11-17 MED ORDER — ACETAMINOPHEN 325 MG PO TABS
ORAL_TABLET | ORAL | Status: AC
  Filled 2016-11-17: qty 2

## 2016-11-17 NOTE — Patient Instructions (Signed)

## 2016-11-19 LAB — TYPE AND SCREEN
ABO/RH(D): O NEG
Antibody Screen: NEGATIVE
UNIT DIVISION: 0
Unit division: 0

## 2016-11-19 LAB — BPAM RBC
Blood Product Expiration Date: 201810172359
Blood Product Expiration Date: 201810252359
ISSUE DATE / TIME: 201809290940
ISSUE DATE / TIME: 201809290940
Unit Type and Rh: 9500
Unit Type and Rh: 9500

## 2016-11-22 ENCOUNTER — Ambulatory Visit (HOSPITAL_BASED_OUTPATIENT_CLINIC_OR_DEPARTMENT_OTHER): Payer: Medicare Other | Admitting: Hematology

## 2016-11-22 ENCOUNTER — Encounter: Payer: Self-pay | Admitting: Hematology

## 2016-11-22 ENCOUNTER — Telehealth: Payer: Self-pay | Admitting: Hematology

## 2016-11-22 ENCOUNTER — Other Ambulatory Visit (HOSPITAL_BASED_OUTPATIENT_CLINIC_OR_DEPARTMENT_OTHER): Payer: Medicare Other

## 2016-11-22 VITALS — BP 106/58 | HR 73 | Temp 98.5°F | Resp 17 | Ht 61.0 in | Wt 135.6 lb

## 2016-11-22 DIAGNOSIS — D649 Anemia, unspecified: Secondary | ICD-10-CM

## 2016-11-22 DIAGNOSIS — D696 Thrombocytopenia, unspecified: Secondary | ICD-10-CM | POA: Diagnosis not present

## 2016-11-22 DIAGNOSIS — D6182 Myelophthisis: Secondary | ICD-10-CM

## 2016-11-22 DIAGNOSIS — D72819 Decreased white blood cell count, unspecified: Secondary | ICD-10-CM

## 2016-11-22 DIAGNOSIS — D469 Myelodysplastic syndrome, unspecified: Secondary | ICD-10-CM

## 2016-11-22 DIAGNOSIS — D7581 Myelofibrosis: Secondary | ICD-10-CM

## 2016-11-22 LAB — FERRITIN: Ferritin: 240 ng/ml (ref 22–316)

## 2016-11-22 NOTE — Telephone Encounter (Signed)
Gave avs and calendar for October and November  °

## 2016-11-22 NOTE — Progress Notes (Signed)
[name]    HEMATOLOGY/ONCOLOGY CLINIC NOTE  Date of Service: 11/22/16  Patient Care Team: Marletta Lor, MD as PCP - General  CHIEF COMPLAINTS/PURPOSE OF CONSULTATION:  Macrocytic anemia  HISTORY OF PRESENTING ILLNESS:   Reginald Daniels is a wonderful 71 y.o. male who has been referred to [place] by Dr .Burnice Logan, Doretha Sou, MD for evaluation and management of macrocytic anemia.  Patient has a h/o mental retardation (has a legal guardia -[name] who was present for visit), prostate cancer (managed by urology, s/p brachy RT with seeds), chronic macrocytosis with anemia and leukopenia.  He recently on 12/28/2015 has labs with his PCP including CBC which showed severe anemia with hgb of 7.8 MCV 123, nl PLT 185k, mild leucopenia 3.4k.  He has had significant macrocytosis since atleast 2013 but possibly longer/lifelong. However previously had hgb of 12.1 in 02/2013 and 13.2 in 05/2011. He has had wbc count of 2.7-3.4k in this time period.  Patient is a limited historian. Notes no significant dizziness or lightheadedness. Some fatigue. No weight loss. Night sweats. No bone pains. Denies any other new urinary symptoms. He is not reporting any other focal symptoms. He is legal guardian does not suggest any other complaints.  CURRENT THERAPY:150 mcg Aranesp every 2 weeks   INTERVAL HISTORY  Patient is here for f/u of his lab results. He presents today with his legal guardian, Mr Reginald Daniels. He has overall been doing well since we last saw him. He reported that his fatigue and energy levels have improved since his last PRBC transfusions. He did have 2u PRBC on Saturday (11/17/16). His appetite has been good recently.  We discussed that his last last pre-transfusion labs showed anemia and worsening thrombocytopenia. On review of systems, denies lightheadedness, dizziness, fatigue. Denies pain.    MEDICAL HISTORY:  Past Medical History:  Diagnosis Date  . Cancer [facility])    prostate  .  Leukopenia   . Macrocytosis   . Mental retardation   . Thrombocytopenia ([place])     SURGICAL HISTORY: status post seed implantation for prostate cancer  bilateral inguinal herniorrhaphies   SOCIAL HISTORY: Social History   Social History  . Marital status: Married    Spouse name: N/A  . Number of children: N/A  . Years of education: N/A   Occupational History  . Not on file.   Social History Main Topics  . Smoking status: Never Smoker  . Smokeless tobacco: Never Used  . Alcohol use No  . Drug use: No  . Sexual activity: Not on file   Other Topics Concern  . Not on file   Social History Narrative  . No narrative on file  Never Smoked  patient has never been employed due to mental retardation  he lives with a court appointed guardian - [name] Smoking Status: never  FAMILY HISTORY:  father died at age 8  mother died young. A suicide death  No siblings   ALLERGIES:  has No Known Allergies.  MEDICATIONS:  Current Outpatient Prescriptions  Medication Sig Dispense Refill  . b complex vitamins capsule Take 1 capsule by mouth daily.    . cetirizine (ZYRTEC) 10 MG tablet Take 10 mg by mouth daily.    . diphenhydrAMINE (BENADRYL ALLERGY) 25 mg capsule Take 50 mg by mouth every 6 (six) hours as needed.    . diphenoxylate-atropine (LOMOTIL) 2.5-0.025 MG tablet Take 1 tablet by mouth 4 (four) times daily as needed for diarrhea or loose stools. 30 tablet 0  .  Multiple Vitamin (MULTIVITAMIN) tablet Take 1 tablet by mouth daily.       No current facility-administered medications for this visit.     REVIEW OF SYSTEMS:    A 10+ POINT REVIEW OF SYSTEMS WAS OBTAINED including neurology, dermatology, psychiatry, cardiac, respiratory, lymph, extremities, GI, GU, Musculoskeletal, constitutional, breasts, reproductive, HEENT.  All pertinent positives are noted in the HPI.  All others are negative.  PHYSICAL EXAMINATION:  ECOG PERFORMANCE STATUS: 1 - Symptomatic but  completely ambulatory  . Vitals:   11/22/16 0923  BP: (!) 106/58  Pulse: 73  Resp: 17  Temp: 98.5 F (36.9 C)  SpO2: 100%   Filed Weights   11/22/16 0923  Weight: 135 lb 9.6 oz (61.5 kg)   .Body mass index is 25.62 kg/m.  GENERAL:alert, in no acute distress and comfortable, appears pale SKIN: no acute rashes EYES: Conjunctivae with pallor no scleral icterus OROPHARYNX:no exudate, no erythema and lips, buccal mucosa, and tongue normal  NECK: supple, no JVD, thyroid normal size, non-tender, without nodularity LYMPH:  no palpable lymphadenopathy in the cervical, axillary or inguinal LUNGS: clear to auscultation with normal respiratory effort HEART: regular rate & rhythm,  no murmurs and no lower extremity edema ABDOMEN: abdomen soft, non-tender, normoactive bowel sounds , no clinically palpable hepato-splenomegaly. Musculoskeletal: no cyanosis of digits and no clubbing  PSYCH: alert , significant cognitive issues due to h/o mental retardation NEURO: no focal motor/sensory deficits  LABORATORY DATA:  I have reviewed the data as listed  .[name] CBC Latest Ref Rng & Units 11/16/2016 11/02/2016 10/26/2016  WBC 4.0 - 10.3 10e3/uL 3.0(L) 3.0(L) 3.2(L)  Hemoglobin 13.0 - 17.1 g/dL 7.0(L) 7.5(L) 7.7(L)  Hematocrit 38.4 - 49.9 % 21.6(L) 23.2(L) 23.8(L)  Platelets 140 - 400 10e3/uL 55 Large & giant platelets(L) 86(L) 87(L)   . CBC    Component Value Date/Time   WBC 3.0 (L) 11/16/2016 0843   WBC 4.3 10/01/2016 0921   RBC 1.82 (L) 11/16/2016 0843   RBC 2.30 (L) 10/01/2016 0921   HGB 7.0 (L) 11/16/2016 0843   HCT 21.6 (L) 11/16/2016 0843   PLT 55 Large & giant platelets (L) 11/16/2016 0843   MCV 118.7 (H) 11/16/2016 0843   MCH 38.5 (H) 11/16/2016 0843   MCH 37.4 (H) 10/01/2016 0921   MCHC 32.4 11/16/2016 0843   MCHC 33.6 10/01/2016 0921   RDW 25.0 (H) 11/16/2016 0843   LYMPHSABS 0.6 (L) 11/02/2016 0830   MONOABS 0.8 11/02/2016 0830   EOSABS 0.0 11/02/2016 0830   BASOSABS 0.0  11/02/2016 0830      . CMP Latest Ref Rng & Units 11/02/2016 10/26/2016 10/10/2016  Glucose 70 - 140 mg/dl 88 91 78  BUN 7.0 - 26.0 mg/dL 20.5 21.1 16.9  Creatinine 0.7 - 1.3 mg/dL 1.4(H) 1.4(H) 1.3  Sodium 136 - 145 mEq/L 141 139 141  Potassium 3.5 - 5.1 mEq/L 4.3 4.2 4.2  Chloride 96 - 112 mEq/L - - -  CO2 22 - 29 mEq/L 24 24 25   Calcium 8.4 - 10.4 mg/dL 9.8 9.4 9.4  Total Protein 6.4 - 8.3 g/dL 6.8 6.7 6.9  Total Bilirubin 0.20 - 1.20 mg/dL 0.98 1.22(H) 0.85  Alkaline Phos 40 - 150 U/L 68 73 71  AST 5 - 34 U/L 16 16 16   ALT 0 - 55 U/L 9 9 11    Component     Latest Ref Rng & Units 10/10/2016  Erythropoietin     2.6 - 18.5 mIU/mL 108.1 (H)  RADIOGRAPHIC STUDIES: I have personally reviewed the radiological images as listed and agreed with the findings in the report. No results found.  ASSESSMENT & PLAN:   71 year old very pleasant gentleman with mental retardation here with his legal guardian for evaluation of   1) Severe Macrocytic Anemia  Hemoglobin of 7.8 on 12/28/2015. Hemoglobin of 7.7 as of 10/26/16.  Previously, his Hgb appeared to be improving to 8.4 then to 8.9 and now up to 9.6 with improving MCV and RDW while on multivitamins and B complex. Multiple myeloma panel and serum free light chains within normal limits TSH within normal limits LDH and haptoglobin suggest no evidence of hemolysis B12 and calcium levels within normal limits. He has had recent evaluation by his urologist which suggested his prostate cancer has been stable.  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 Cytogenetics- trisomy 8 -- FISH panel -pending Jak2 neg  BCR-ABL neg  Ferritin levels adequate EPO levels 108  Plan -I discussed  Available lab results. - we discussed increasing cytopenias from his MDS. -We also discussed that over time he does have a higher risk of leukemia and other blood cancers secondary to his condition.  --Pt was  previously started on Aranesp for use every 2 weeks for MDS, condition has continued to worsen despite this. We will increase his dosage to 312mg q2weeks for now to see if this has any improvement in his hgb .  -transfuse prbc for hgb<7 or if symptomatic -Will have the patient follow up for Aranesp injection q2weeks -Will monitor labs every 2 weeks -RTC with Dr. KIrene Limboin 4 weeks.   2) Leukopenia - WBC count .  WBC 2.9k, ANC 2k as of 10/10/16 WBC 3.2k as of 10/26/16. ANC 1.8k ANC today 1.6k  3) Thrombocytopenia - worsening PLT at 100k  as of 10/10/2016.  -PLT at 87 as of 10/26/2016. PLT at 55k on 11/15/2016 No evidence of abnormal bruising or bleeding.  PLAN -Will have the patient follow up for Aranesp injection q2weeks -Will monitor labs every 2 weeks -patient and his guardian were recommended to call [place] they note increasing fatigue or dizziness of DOE.   Continue Aranesp shots q2weeks Labs q2weeks RTC with Dr KIrene Limboin 4 weeks w/ labs   All of the patients questions were answered with apparent satisfaction. The patient knows to call the clinic with any problems, questions or concerns.  I spent 20 minutes counseling the patient face to face. The total time spent in the appointment was 25 minutes and more than 50% was on counseling and direct patient cares.    GSullivan LoneMD MJoannaAAHIVMS SMidwest Center For Day SurgeryCAspen Hills Healthcare CenterHematology/Oncology Physician CW[place] (Office):       [phone](Work cell):  [phone](Fax):           [phone] This document serves as a record of services personally performed by GSullivan Lone MD. It was created on his behalf by WReola Mosher a trained medical scribe. The creation of this record is based on the scribe's personal observations and the provider's statements to them. This document has been checked and approved by the attending provider.

## 2016-11-23 ENCOUNTER — Ambulatory Visit: Payer: Medicare Other

## 2016-11-23 ENCOUNTER — Ambulatory Visit: Payer: Medicare Other | Admitting: Hematology

## 2016-11-28 ENCOUNTER — Ambulatory Visit: Payer: Medicare Other

## 2016-11-28 ENCOUNTER — Other Ambulatory Visit: Payer: Medicare Other

## 2016-11-30 ENCOUNTER — Ambulatory Visit (HOSPITAL_BASED_OUTPATIENT_CLINIC_OR_DEPARTMENT_OTHER): Payer: Medicare Other

## 2016-11-30 ENCOUNTER — Other Ambulatory Visit (HOSPITAL_BASED_OUTPATIENT_CLINIC_OR_DEPARTMENT_OTHER): Payer: Medicare Other

## 2016-11-30 VITALS — BP 119/55 | HR 72 | Temp 97.8°F | Resp 16

## 2016-11-30 DIAGNOSIS — D469 Myelodysplastic syndrome, unspecified: Secondary | ICD-10-CM

## 2016-11-30 DIAGNOSIS — D6182 Myelophthisis: Secondary | ICD-10-CM

## 2016-11-30 DIAGNOSIS — D696 Thrombocytopenia, unspecified: Secondary | ICD-10-CM

## 2016-11-30 DIAGNOSIS — D649 Anemia, unspecified: Secondary | ICD-10-CM

## 2016-11-30 DIAGNOSIS — D7581 Myelofibrosis: Secondary | ICD-10-CM

## 2016-11-30 LAB — COMPREHENSIVE METABOLIC PANEL
ALBUMIN: 3.6 g/dL (ref 3.5–5.0)
ALT: 11 U/L (ref 0–55)
AST: 16 U/L (ref 5–34)
Alkaline Phosphatase: 68 U/L (ref 40–150)
Anion Gap: 9 mEq/L (ref 3–11)
BUN: 20.1 mg/dL (ref 7.0–26.0)
CHLORIDE: 113 meq/L — AB (ref 98–109)
CO2: 21 mEq/L — ABNORMAL LOW (ref 22–29)
Calcium: 9.8 mg/dL (ref 8.4–10.4)
Creatinine: 1.2 mg/dL (ref 0.7–1.3)
EGFR: 60 mL/min/{1.73_m2} — ABNORMAL LOW (ref >60)
GLUCOSE: 91 mg/dL (ref 70–140)
POTASSIUM: 4 meq/L (ref 3.5–5.1)
SODIUM: 143 meq/L (ref 136–145)
Total Bilirubin: 0.98 mg/dL (ref 0.20–1.20)
Total Protein: 7.1 g/dL (ref 6.4–8.3)

## 2016-11-30 LAB — CBC & DIFF AND RETIC
BASO%: 0.7 % (ref 0.0–2.0)
BASOS ABS: 0 10*3/uL (ref 0.0–0.1)
EOS%: 0.4 % (ref 0.0–7.0)
Eosinophils Absolute: 0 10*3/uL (ref 0.0–0.5)
HCT: 30.5 % — ABNORMAL LOW (ref 38.4–49.9)
HEMOGLOBIN: 10 g/dL — AB (ref 13.0–17.1)
Immature Retic Fract: 8.8 % (ref 3.00–10.60)
LYMPH#: 0.7 10*3/uL — AB (ref 0.9–3.3)
LYMPH%: 25 % (ref 14.0–49.0)
MCH: 33.9 pg — AB (ref 27.2–33.4)
MCHC: 32.8 g/dL (ref 32.0–36.0)
MCV: 103.4 fL — ABNORMAL HIGH (ref 79.3–98.0)
MONO#: 0.6 10*3/uL (ref 0.1–0.9)
MONO%: 21.8 % — ABNORMAL HIGH (ref 0.0–14.0)
NEUT#: 1.5 10*3/uL (ref 1.5–6.5)
NEUT%: 52.1 % (ref 39.0–75.0)
NRBC: 1 % — AB (ref 0–0)
Platelets: 42 10*3/uL — ABNORMAL LOW (ref 140–400)
RBC: 2.95 10*6/uL — ABNORMAL LOW (ref 4.20–5.82)
RDW: 25.2 % — AB (ref 11.0–14.6)
RETIC %: 2.68 % — AB (ref 0.80–1.80)
RETIC CT ABS: 79.06 10*3/uL (ref 34.80–93.90)
WBC: 2.8 10*3/uL — ABNORMAL LOW (ref 4.0–10.3)

## 2016-11-30 LAB — FERRITIN: Ferritin: 446 ng/ml — ABNORMAL HIGH (ref 22–316)

## 2016-11-30 LAB — TECHNOLOGIST REVIEW: Technologist Review: 11

## 2016-11-30 MED ORDER — DARBEPOETIN ALFA 300 MCG/0.6ML IJ SOSY
300.0000 ug | PREFILLED_SYRINGE | Freq: Once | INTRAMUSCULAR | Status: AC
  Administered 2016-11-30: 300 ug via SUBCUTANEOUS
  Filled 2016-11-30: qty 0.6

## 2016-12-03 DIAGNOSIS — R310 Gross hematuria: Secondary | ICD-10-CM | POA: Diagnosis not present

## 2016-12-03 DIAGNOSIS — N3041 Irradiation cystitis with hematuria: Secondary | ICD-10-CM | POA: Diagnosis not present

## 2016-12-20 NOTE — Progress Notes (Signed)
HEMATOLOGY/ONCOLOGY CLINIC NOTE  Date of Service: 12/21/16  Patient Care Team: Marletta Lor, MD as PCP - General  CHIEF COMPLAINTS/PURPOSE OF CONSULTATION:  Macrocytic anemia  HISTORY OF PRESENTING ILLNESS:   Reginald Daniels is a wonderful 71 y.o. male who has been referred to [place] by Dr .Burnice Logan, Doretha Sou, MD for evaluation and management of macrocytic anemia.  Patient has a h/o mental retardation (has a legal guardia -[name] who was present for visit), prostate cancer (managed by urology, s/p brachy RT with seeds), chronic macrocytosis with anemia and leukopenia.  He recently on 12/28/2015 has labs with his PCP including CBC which showed severe anemia with hgb of 7.8 MCV 123, nl PLT 185k, mild leucopenia 3.4k.  He has had significant macrocytosis since atleast 2013 but possibly longer/lifelong. However previously had hgb of 12.1 in 02/2013 and 13.2 in 05/2011. He has had wbc count of 2.7-3.4k in this time period.  Patient is a limited historian. Notes no significant dizziness or lightheadedness. Some fatigue. No weight loss. Night sweats. No bone pains. Denies any other new urinary symptoms. He is not reporting any other focal symptoms. He is legal guardian does not suggest any other complaints.  CURRENT THERAPY:300 mcg Aranesp every 2 weeks   INTERVAL HISTORY  Patient is here for f/u of his lab results. He presents today with his legal guardian, Reginald Daniels. He has overall been doing well since we last saw him. He denies any issues with Aranesp that he is obtaining. He recently went to [facility] with his guardian, Reginald Daniels to visit Mr. Freeman's daughter. He reports that he enjoys holidays, as it is a great time to spend with family.  Hemoglobin levels gradually decreasing to 7.9.  At this point the patient has no overt clinical symptoms of anemia.  No significant fatigue lightheadedness dizziness dyspnea on exertion at this time.  On review of systems, he denies  decreased energy levels. He report weight gain. He denies BLE swelling or pain to his BLE.     MEDICAL HISTORY:  Past Medical History:  Diagnosis Date  . Cancer [facility])    prostate  . Leukopenia   . Macrocytosis   . Mental retardation   . Thrombocytopenia ([place])     SURGICAL HISTORY: status post seed implantation for prostate cancer  bilateral inguinal herniorrhaphies   SOCIAL HISTORY: Social History   Social History  . Marital status: Married    Spouse name: N/A  . Number of children: N/A  . Years of education: N/A   Occupational History  . Not on file.   Social History Main Topics  . Smoking status: Never Smoker  . Smokeless tobacco: Never Used  . Alcohol use No  . Drug use: No  . Sexual activity: Not on file   Other Topics Concern  . Not on file   Social History Narrative  . No narrative on file  Never Smoked  patient has never been employed due to mental retardation  he lives with a court appointed guardian - [name] Smoking Status: never  FAMILY HISTORY:  father died at age 77  mother died young. A suicide death  No siblings   ALLERGIES:  has No Known Allergies.  MEDICATIONS:  Current Outpatient Medications  Medication Sig Dispense Refill  . b complex vitamins capsule Take 1 capsule by mouth daily.    . cetirizine (ZYRTEC) 10 MG tablet Take 10 mg by mouth daily.    . diphenhydrAMINE (BENADRYL ALLERGY)  25 mg capsule Take 50 mg by mouth every 6 (six) hours as needed.    . diphenoxylate-atropine (LOMOTIL) 2.5-0.025 MG tablet Take 1 tablet by mouth 4 (four) times daily as needed for diarrhea or loose stools. 30 tablet 0  . Multiple Vitamin (MULTIVITAMIN) tablet Take 1 tablet by mouth daily.       No current facility-administered medications for this visit.     REVIEW OF SYSTEMS:    A 10+ POINT REVIEW OF SYSTEMS WAS OBTAINED including neurology, dermatology, psychiatry, cardiac, respiratory, lymph, extremities, GI, GU, Musculoskeletal,  constitutional, breasts, reproductive, HEENT.  All pertinent positives are noted in the HPI.  All others are negative.  PHYSICAL EXAMINATION:  ECOG PERFORMANCE STATUS: 1 - Symptomatic but completely ambulatory  . Vitals:   12/21/16 1051  BP: (!) 101/56  Pulse: 75  Resp: 20  Temp: 98.8 F (37.1 C)  SpO2: 100%   Filed Weights   12/21/16 1051  Weight: 140 lb 8 oz (63.7 kg)   .Body mass index is 26.55 kg/m.  GENERAL:alert, in no acute distress and comfortable, appears pale SKIN: no acute rashes EYES: Conjunctivae with pallor no scleral icterus OROPHARYNX:no exudate, no erythema and lips, buccal mucosa, and tongue normal  NECK: supple, no JVD, thyroid normal size, non-tender, without nodularity LYMPH:  no palpable lymphadenopathy in the cervical, axillary or inguinal LUNGS: clear to auscultation with normal respiratory effort HEART: regular rate & rhythm,  no murmurs and no lower extremity edema ABDOMEN: abdomen soft, non-tender, normoactive bowel sounds , no clinically palpable hepato-splenomegaly. Musculoskeletal: no cyanosis of digits and no clubbing  PSYCH: alert , significant cognitive issues due to h/o mental retardation NEURO: no focal motor/sensory deficits  LABORATORY DATA:  I have reviewed the data as listed  .[name] CBC Latest Ref Rng & Units 12/24/2016 12/21/2016 11/30/2016  WBC 4.0 - 10.3 10e3/uL 4.3 4.5 2.8(L)  Hemoglobin 13.0 - 17.1 g/dL 7.9(L) 7.9(L) 10.0(L)  Hematocrit 38.4 - 49.9 % 24.3(L) 24.6(L) 30.5(L)  Platelets 140 - 400 10e3/uL 77(L) 62(L) 42(L)   . CBC    Component Value Date/Time   WBC 4.3 12/24/2016 0927   WBC 4.3 10/01/2016 0921   RBC 2.25 (L) 12/24/2016 0927   RBC 2.30 (L) 10/01/2016 0921   HGB 7.9 (L) 12/24/2016 0927   HCT 24.3 (L) 12/24/2016 0927   PLT 77 (L) 12/24/2016 0927   MCV 108.0 (H) 12/24/2016 0927   MCH 35.1 (H) 12/24/2016 0927   MCH 37.4 (H) 10/01/2016 0921   MCHC 32.5 12/24/2016 0927   MCHC 33.6 10/01/2016 0921   RDW 28.5 (H)  12/24/2016 0927   LYMPHSABS 0.4 (L) 12/24/2016 0927   MONOABS 1.8 (H) 12/24/2016 0927   EOSABS 0.0 12/24/2016 0927   BASOSABS 0.0 12/24/2016 0927      . CMP Latest Ref Rng & Units 11/30/2016 11/02/2016 10/26/2016  Glucose 70 - 140 mg/dl 91 88 91  BUN 7.0 - 26.0 mg/dL 20.1 20.5 21.1  Creatinine 0.7 - 1.3 mg/dL 1.2 1.4(H) 1.4(H)  Sodium 136 - 145 mEq/L 143 141 139  Potassium 3.5 - 5.1 mEq/L 4.0 4.3 4.2  Chloride 96 - 112 mEq/L - - -  CO2 22 - 29 mEq/L 21(L) 24 24  Calcium 8.4 - 10.4 mg/dL 9.8 9.8 9.4  Total Protein 6.4 - 8.3 g/dL 7.1 6.8 6.7  Total Bilirubin 0.20 - 1.20 mg/dL 0.98 0.98 1.22(H)  Alkaline Phos 40 - 150 U/L 68 68 73  AST 5 - 34 U/L 16 16 16   ALT  0 - 55 U/L 11 9 9    Component     Latest Ref Rng & Units 10/10/2016  Erythropoietin     2.6 - 18.5 mIU/mL 108.1 (H)             RADIOGRAPHIC STUDIES: I have personally reviewed the radiological images as listed and agreed with the findings in the report. No results found.  ASSESSMENT & PLAN:   72 year old very pleasant gentleman with mental retardation here with his legal guardian for evaluation of   1) Severe Macrocytic Anemia  Hemoglobin of 7.8 on 12/28/2015. Hemoglobin of 7.7 as of 10/26/16.  Previously, his Hgb appeared to be improving to 8.4 then to 8.9 and now up to 9.6 with improving MCV and RDW while on multivitamins and B complex. -Hgb at 10, MCV at 103.4 and RDW at 25.2 as of 11/30/2016 (s/p PRBC transfusion) -Hgb at 7.9, MCV at 34.3, and RDW at 27.6 as of today, 12/21/2016 Multiple myeloma panel and serum free light chains within normal limits TSH within normal limits LDH and haptoglobin suggest no evidence of hemolysis B12 and calcium levels within normal limits. He has had recent evaluation by his urologist which suggested his prostate cancer has been stable.  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  WBC 2.9k, ANC 2k as of 10/10/16 WBC 3.2k as of 10/26/16. ANC 1.8k ANC today 1.6k (11/16/2016) WBC 2.8k as of 11/30/2016 WBC at 4.5k as of today, 12/21/2016   3) Thrombocytopenia - worsening PLT at 100k  as of 10/10/2016.  -PLT at 87 as of 10/26/2016. PLT at 55k on 11/15/2016 -PLT at 42k as of 11/30/2016  -PLT at 62 as of 12/21/2016  No evidence of abnormal bruising or bleeding.  PLAN Plan  -I discussed available lab results.  His WBC count has improved to 4.5K and platelets are better at 62k. -Conitnue on Aranesp 370mg q2weeks for now to see if this might reduce his need for PRBC transfusions. -transfuse prbc for hgb<7 or if symptomatic -Will have the patient follow up for Aranesp injection q2weeks -Will monitor labs every 2 weeks -patient and his guardian were recommended to call [place] note increasing fatigue or dizziness of DOE.   Continue Aranesp q2weeks - next dose on Monday 12/24/2016. Please schedule next 12 doses. RTC with Dr KIrene Limboin 6 weeks Labs q2weeks with Aranesp   All of the patients questions were answered with apparent satisfaction. The patient knows to call the clinic with any problems, questions or concerns.  I spent 20 minutes counseling the patient face to face. The total time spent in the appointment was 25 minutes and more than 50% was on counseling and direct patient cares.    GSullivan LoneMD MWheatlandAAHIVMS SSelect [facility] Hospital-Color[facility] SpringsCCascade Valley Arlington Surgery CenterHematology/Oncology Physician [place] (Office):       [phone](Work cell):  [phone](Fax):           [phone] This document serves as a record of services personally performed by GSullivan Lone MD. It was created on his behalf by SSteva Colder a trained medical scribe. The creation of this record is based on the scribe's personal observations and the provider's statements to them. This document  has been checked and approved by the  attending provider.   .I have reviewed the above documentation for accuracy and completeness, and I agree with the above. Brunetta Genera MD MS

## 2016-12-21 ENCOUNTER — Telehealth: Payer: Self-pay | Admitting: Hematology

## 2016-12-21 ENCOUNTER — Other Ambulatory Visit (HOSPITAL_BASED_OUTPATIENT_CLINIC_OR_DEPARTMENT_OTHER): Payer: Medicare Other

## 2016-12-21 ENCOUNTER — Ambulatory Visit (HOSPITAL_BASED_OUTPATIENT_CLINIC_OR_DEPARTMENT_OTHER): Payer: Medicare Other | Admitting: Hematology

## 2016-12-21 ENCOUNTER — Encounter: Payer: Self-pay | Admitting: Hematology

## 2016-12-21 VITALS — BP 101/56 | HR 75 | Temp 98.8°F | Resp 20 | Ht 61.0 in | Wt 140.5 lb

## 2016-12-21 DIAGNOSIS — D649 Anemia, unspecified: Secondary | ICD-10-CM

## 2016-12-21 DIAGNOSIS — D72819 Decreased white blood cell count, unspecified: Secondary | ICD-10-CM | POA: Diagnosis not present

## 2016-12-21 DIAGNOSIS — D61818 Other pancytopenia: Secondary | ICD-10-CM

## 2016-12-21 DIAGNOSIS — D696 Thrombocytopenia, unspecified: Secondary | ICD-10-CM | POA: Diagnosis not present

## 2016-12-21 DIAGNOSIS — D469 Myelodysplastic syndrome, unspecified: Secondary | ICD-10-CM | POA: Diagnosis not present

## 2016-12-21 DIAGNOSIS — D539 Nutritional anemia, unspecified: Secondary | ICD-10-CM | POA: Diagnosis not present

## 2016-12-21 DIAGNOSIS — D7581 Myelofibrosis: Secondary | ICD-10-CM

## 2016-12-21 LAB — CBC & DIFF AND RETIC
BASO%: 0.7 % (ref 0.0–2.0)
Basophils Absolute: 0 10*3/uL (ref 0.0–0.1)
EOS%: 0.4 % (ref 0.0–7.0)
Eosinophils Absolute: 0 10*3/uL (ref 0.0–0.5)
HCT: 24.6 % — ABNORMAL LOW (ref 38.4–49.9)
HGB: 7.9 g/dL — ABNORMAL LOW (ref 13.0–17.1)
Immature Retic Fract: 14.3 % — ABNORMAL HIGH (ref 3.00–10.60)
LYMPH#: 0.6 10*3/uL — AB (ref 0.9–3.3)
LYMPH%: 13.6 % — ABNORMAL LOW (ref 14.0–49.0)
MCH: 34.3 pg — ABNORMAL HIGH (ref 27.2–33.4)
MCHC: 32.1 g/dL (ref 32.0–36.0)
MCV: 107 fL — ABNORMAL HIGH (ref 79.3–98.0)
MONO#: 1.8 10*3/uL — AB (ref 0.1–0.9)
MONO%: 41 % — ABNORMAL HIGH (ref 0.0–14.0)
NEUT%: 44.3 % (ref 39.0–75.0)
NEUTROS ABS: 2 10*3/uL (ref 1.5–6.5)
NRBC: 4 % — AB (ref 0–0)
Platelets: 62 10*3/uL — ABNORMAL LOW (ref 140–400)
RBC: 2.3 10*6/uL — AB (ref 4.20–5.82)
RDW: 27.6 % — ABNORMAL HIGH (ref 11.0–14.6)
RETIC %: 4.34 % — AB (ref 0.80–1.80)
RETIC CT ABS: 99.82 10*3/uL — AB (ref 34.80–93.90)
WBC: 4.5 10*3/uL (ref 4.0–10.3)

## 2016-12-21 LAB — TECHNOLOGIST REVIEW: Technologist Review: 7

## 2016-12-21 NOTE — Patient Instructions (Signed)
Thank you for choosing [facility] to provide your oncology and hematology care.  To afford each patient quality time with our providers, please arrive 30 minutes before your scheduled appointment time.  If you arrive late for your appointment, you may be asked to reschedule.  We strive to give you quality time with our providers, and arriving late affects you and other patients whose appointments are after yours.   If you are a no show for multiple scheduled visits, you may be dismissed from the clinic at the providers discretion.    Again, thank you for choosing [facility], our hope is that these requests will decrease the amount of time that you wait before being seen by our physicians.  ______________________________________________________________________  Should you have questions after your visit to the [facility], please contact our office at [phone] between the hours of 8:30 and 4:30 p.m.    Voicemails left after 4:30p.m will not be returned until the following business day.    For prescription refill requests, please have your pharmacy contact us directly.  Please also try to allow 48 hours for prescription requests.    Please contact the scheduling department for questions regarding scheduling.  For scheduling of procedures such as PET scans, CT scans, MRI, Ultrasound, etc please contact central scheduling at [phone].    Resources For Cancer Patients and Caregivers:   Oncolink.org:  A wonderful resource for patients and healthcare providers for information regarding your disease, ways to tract your treatment, what to expect, etc.     American Cancer Society:  800-227-2345  Can help patients locate various types of support and financial assistance  Cancer Care: 1-800-813-HOPE (4673) Provides financial assistance, online support groups, medication/co-pay assistance.    [place] DSS:  336-641-3447 Where to apply for food  stamps, Medicaid, and utility assistance  Medicare Rights Center: 800-333-4114 Helps people with Medicare understand their rights and benefits, navigate the Medicare system, and secure the quality healthcare they deserve  SCAT: 336-333-6589 [employer]'s shared-ride transportation service for eligible riders who have a disability that prevents them from riding the fixed route bus.    For additional information on assistance programs please contact our social worker:   Grier Hock/Abigail Elmore:  [phone]            

## 2016-12-21 NOTE — Telephone Encounter (Signed)
Gave avs and calendar for November - April 2019

## 2016-12-24 ENCOUNTER — Ambulatory Visit (HOSPITAL_BASED_OUTPATIENT_CLINIC_OR_DEPARTMENT_OTHER): Payer: Medicare Other

## 2016-12-24 ENCOUNTER — Other Ambulatory Visit (HOSPITAL_BASED_OUTPATIENT_CLINIC_OR_DEPARTMENT_OTHER): Payer: Medicare Other

## 2016-12-24 VITALS — BP 105/52 | HR 78 | Temp 98.3°F | Resp 20

## 2016-12-24 DIAGNOSIS — D469 Myelodysplastic syndrome, unspecified: Secondary | ICD-10-CM

## 2016-12-24 DIAGNOSIS — D6182 Myelophthisis: Secondary | ICD-10-CM

## 2016-12-24 DIAGNOSIS — D7581 Myelofibrosis: Secondary | ICD-10-CM

## 2016-12-24 LAB — CBC & DIFF AND RETIC
BASO%: 0.9 % (ref 0.0–2.0)
BASOS ABS: 0 10*3/uL (ref 0.0–0.1)
EOS%: 0.2 % (ref 0.0–7.0)
Eosinophils Absolute: 0 10*3/uL (ref 0.0–0.5)
HEMATOCRIT: 24.3 % — AB (ref 38.4–49.9)
HEMOGLOBIN: 7.9 g/dL — AB (ref 13.0–17.1)
Immature Retic Fract: 15.8 % — ABNORMAL HIGH (ref 3.00–10.60)
LYMPH#: 0.4 10*3/uL — AB (ref 0.9–3.3)
LYMPH%: 8.4 % — AB (ref 14.0–49.0)
MCH: 35.1 pg — ABNORMAL HIGH (ref 27.2–33.4)
MCHC: 32.5 g/dL (ref 32.0–36.0)
MCV: 108 fL — ABNORMAL HIGH (ref 79.3–98.0)
MONO#: 1.8 10*3/uL — AB (ref 0.1–0.9)
MONO%: 42.1 % — ABNORMAL HIGH (ref 0.0–14.0)
NEUT#: 2.1 10*3/uL (ref 1.5–6.5)
NEUT%: 48.4 % (ref 39.0–75.0)
PLATELETS: 77 10*3/uL — AB (ref 140–400)
RBC: 2.25 10*6/uL — ABNORMAL LOW (ref 4.20–5.82)
RDW: 28.5 % — ABNORMAL HIGH (ref 11.0–14.6)
RETIC %: 5.48 % — AB (ref 0.80–1.80)
RETIC CT ABS: 123.3 10*3/uL — AB (ref 34.80–93.90)
WBC: 4.3 10*3/uL (ref 4.0–10.3)
nRBC: 5 % — ABNORMAL HIGH (ref 0–0)

## 2016-12-24 LAB — TECHNOLOGIST REVIEW: Technologist Review: 7

## 2016-12-24 MED ORDER — DARBEPOETIN ALFA 300 MCG/0.6ML IJ SOSY
300.0000 ug | PREFILLED_SYRINGE | Freq: Once | INTRAMUSCULAR | Status: AC
  Administered 2016-12-24: 300 ug via SUBCUTANEOUS
  Filled 2016-12-24: qty 0.6

## 2016-12-24 NOTE — Patient Instructions (Signed)

## 2017-01-07 ENCOUNTER — Other Ambulatory Visit (HOSPITAL_BASED_OUTPATIENT_CLINIC_OR_DEPARTMENT_OTHER): Payer: Medicare Other

## 2017-01-07 ENCOUNTER — Ambulatory Visit (HOSPITAL_BASED_OUTPATIENT_CLINIC_OR_DEPARTMENT_OTHER): Payer: Medicare Other

## 2017-01-07 VITALS — BP 108/49 | HR 76 | Temp 98.3°F | Resp 18

## 2017-01-07 DIAGNOSIS — D469 Myelodysplastic syndrome, unspecified: Secondary | ICD-10-CM

## 2017-01-07 DIAGNOSIS — C61 Malignant neoplasm of prostate: Secondary | ICD-10-CM | POA: Diagnosis not present

## 2017-01-07 DIAGNOSIS — D7581 Myelofibrosis: Secondary | ICD-10-CM

## 2017-01-07 DIAGNOSIS — D6182 Myelophthisis: Secondary | ICD-10-CM

## 2017-01-07 DIAGNOSIS — D696 Thrombocytopenia, unspecified: Secondary | ICD-10-CM

## 2017-01-07 DIAGNOSIS — D649 Anemia, unspecified: Secondary | ICD-10-CM

## 2017-01-07 LAB — CBC & DIFF AND RETIC
BASO%: 1.6 % (ref 0.0–2.0)
Basophils Absolute: 0.1 10*3/uL (ref 0.0–0.1)
EOS%: 0 % (ref 0.0–7.0)
Eosinophils Absolute: 0 10*3/uL (ref 0.0–0.5)
HCT: 24.2 % — ABNORMAL LOW (ref 38.4–49.9)
HEMOGLOBIN: 7.8 g/dL — AB (ref 13.0–17.1)
Immature Retic Fract: 19.8 % — ABNORMAL HIGH (ref 3.00–10.60)
LYMPH%: 24.1 % (ref 14.0–49.0)
MCH: 35.6 pg — AB (ref 27.2–33.4)
MCHC: 32.2 g/dL (ref 32.0–36.0)
MCV: 110.5 fL — AB (ref 79.3–98.0)
MONO#: 1.3 10*3/uL — ABNORMAL HIGH (ref 0.1–0.9)
MONO%: 35.4 % — AB (ref 0.0–14.0)
NEUT%: 38.9 % — ABNORMAL LOW (ref 39.0–75.0)
NEUTROS ABS: 1.5 10*3/uL (ref 1.5–6.5)
PLATELETS: 56 10*3/uL — AB (ref 140–400)
RBC: 2.19 10*6/uL — AB (ref 4.20–5.82)
Retic %: 6.87 % — ABNORMAL HIGH (ref 0.80–1.80)
Retic Ct Abs: 150.45 10*3/uL — ABNORMAL HIGH (ref 34.80–93.90)
WBC: 3.7 10*3/uL — AB (ref 4.0–10.3)
lymph#: 0.9 10*3/uL (ref 0.9–3.3)
nRBC: 8 % — ABNORMAL HIGH (ref 0–0)

## 2017-01-07 LAB — COMPREHENSIVE METABOLIC PANEL
ALBUMIN: 3.5 g/dL (ref 3.5–5.0)
ALK PHOS: 64 U/L (ref 40–150)
ALT: 13 U/L (ref 0–55)
ANION GAP: 7 meq/L (ref 3–11)
AST: 19 U/L (ref 5–34)
BILIRUBIN TOTAL: 1.35 mg/dL — AB (ref 0.20–1.20)
BUN: 16.2 mg/dL (ref 7.0–26.0)
CALCIUM: 9.3 mg/dL (ref 8.4–10.4)
CHLORIDE: 107 meq/L (ref 98–109)
CO2: 24 mEq/L (ref 22–29)
CREATININE: 1.2 mg/dL (ref 0.7–1.3)
EGFR: >60 mL/min/{1.73_m2} (ref >60)
Glucose: 84 mg/dl (ref 70–140)
Potassium: 3.9 mEq/L (ref 3.5–5.1)
SODIUM: 138 meq/L (ref 136–145)
TOTAL PROTEIN: 7 g/dL (ref 6.4–8.3)

## 2017-01-07 LAB — TECHNOLOGIST REVIEW

## 2017-01-07 MED ORDER — DARBEPOETIN ALFA 300 MCG/0.6ML IJ SOSY
300.0000 ug | PREFILLED_SYRINGE | Freq: Once | INTRAMUSCULAR | Status: AC
  Administered 2017-01-07: 300 ug via SUBCUTANEOUS
  Filled 2017-01-07: qty 0.6

## 2017-01-07 NOTE — Patient Instructions (Signed)

## 2017-01-14 DIAGNOSIS — N3041 Irradiation cystitis with hematuria: Secondary | ICD-10-CM | POA: Diagnosis not present

## 2017-01-23 ENCOUNTER — Ambulatory Visit (HOSPITAL_BASED_OUTPATIENT_CLINIC_OR_DEPARTMENT_OTHER): Payer: Medicare Other | Admitting: Hematology

## 2017-01-23 ENCOUNTER — Encounter: Payer: Self-pay | Admitting: Hematology

## 2017-01-23 ENCOUNTER — Other Ambulatory Visit (HOSPITAL_BASED_OUTPATIENT_CLINIC_OR_DEPARTMENT_OTHER): Payer: Medicare Other

## 2017-01-23 ENCOUNTER — Ambulatory Visit (HOSPITAL_BASED_OUTPATIENT_CLINIC_OR_DEPARTMENT_OTHER): Payer: Medicare Other

## 2017-01-23 VITALS — BP 109/59 | HR 71 | Resp 18 | Ht 61.0 in | Wt 137.5 lb

## 2017-01-23 DIAGNOSIS — D7581 Myelofibrosis: Secondary | ICD-10-CM

## 2017-01-23 DIAGNOSIS — D469 Myelodysplastic syndrome, unspecified: Secondary | ICD-10-CM

## 2017-01-23 DIAGNOSIS — D6182 Myelophthisis: Secondary | ICD-10-CM

## 2017-01-23 DIAGNOSIS — D72819 Decreased white blood cell count, unspecified: Secondary | ICD-10-CM

## 2017-01-23 DIAGNOSIS — D539 Nutritional anemia, unspecified: Secondary | ICD-10-CM

## 2017-01-23 DIAGNOSIS — D696 Thrombocytopenia, unspecified: Secondary | ICD-10-CM | POA: Diagnosis not present

## 2017-01-23 DIAGNOSIS — D649 Anemia, unspecified: Secondary | ICD-10-CM

## 2017-01-23 LAB — CBC & DIFF AND RETIC
BASO%: 1.5 % (ref 0.0–2.0)
Basophils Absolute: 0.1 10*3/uL (ref 0.0–0.1)
EOS%: 0.3 % (ref 0.0–7.0)
Eosinophils Absolute: 0 10*3/uL (ref 0.0–0.5)
HCT: 24.5 % — ABNORMAL LOW (ref 38.4–49.9)
HGB: 7.9 g/dL — ABNORMAL LOW (ref 13.0–17.1)
Immature Retic Fract: 18 % — ABNORMAL HIGH (ref 3.00–10.60)
LYMPH%: 18.7 % (ref 14.0–49.0)
MCH: 36.7 pg — ABNORMAL HIGH (ref 27.2–33.4)
MCHC: 32.2 g/dL (ref 32.0–36.0)
MCV: 114 fL — ABNORMAL HIGH (ref 79.3–98.0)
MONO#: 1.4 10*3/uL — ABNORMAL HIGH (ref 0.1–0.9)
MONO%: 35.2 % — ABNORMAL HIGH (ref 0.0–14.0)
NEUT#: 1.8 10*3/uL (ref 1.5–6.5)
NEUT%: 44.3 % (ref 39.0–75.0)
Platelets: 52 10*3/uL — ABNORMAL LOW (ref 140–400)
RBC: 2.15 10*6/uL — ABNORMAL LOW (ref 4.20–5.82)
Retic %: 7.08 % — ABNORMAL HIGH (ref 0.80–1.80)
Retic Ct Abs: 152.22 10*3/uL — ABNORMAL HIGH (ref 34.80–93.90)
WBC: 4 10*3/uL (ref 4.0–10.3)
lymph#: 0.7 10*3/uL — ABNORMAL LOW (ref 0.9–3.3)
nRBC: 6 % — ABNORMAL HIGH (ref 0–0)

## 2017-01-23 LAB — COMPREHENSIVE METABOLIC PANEL
ALT: 11 U/L (ref 0–55)
AST: 20 U/L (ref 5–34)
Albumin: 3.7 g/dL (ref 3.5–5.0)
Alkaline Phosphatase: 65 U/L (ref 40–150)
Anion Gap: 9 mEq/L (ref 3–11)
BUN: 19.4 mg/dL (ref 7.0–26.0)
CO2: 22 mEq/L (ref 22–29)
Calcium: 9.7 mg/dL (ref 8.4–10.4)
Chloride: 108 mEq/L (ref 98–109)
Creatinine: 1.2 mg/dL (ref 0.7–1.3)
EGFR: 57 mL/min/{1.73_m2} — ABNORMAL LOW (ref >60)
Glucose: 84 mg/dl (ref 70–140)
Potassium: 4.2 mEq/L (ref 3.5–5.1)
Sodium: 138 mEq/L (ref 136–145)
Total Bilirubin: 1.33 mg/dL — ABNORMAL HIGH (ref 0.20–1.20)
Total Protein: 7.1 g/dL (ref 6.4–8.3)

## 2017-01-23 LAB — FERRITIN: Ferritin: 262 ng/ml (ref 22–316)

## 2017-01-23 LAB — TECHNOLOGIST REVIEW

## 2017-01-23 MED ORDER — DARBEPOETIN ALFA 300 MCG/0.6ML IJ SOSY
300.0000 ug | PREFILLED_SYRINGE | Freq: Once | INTRAMUSCULAR | Status: AC
  Administered 2017-01-23: 300 ug via SUBCUTANEOUS
  Filled 2017-01-23: qty 0.6

## 2017-01-23 NOTE — Progress Notes (Signed)
HEMATOLOGY/ONCOLOGY CLINIC NOTE  Date of Service: 01/23/17  Patient Care Team: Marletta Lor, MD as PCP - General  CHIEF COMPLAINTS/PURPOSE OF CONSULTATION:  F/u for MDS/PMF  HISTORY OF PRESENTING ILLNESS:   Reginald Daniels is a wonderful 71 y.o. male who has been referred to [place] by Dr .Burnice Logan, Doretha Sou, MD for evaluation and management of macrocytic anemia.  Patient has a h/o mental retardation (has a legal guardia -[name] who was present for visit), prostate cancer (managed by urology, s/p brachy RT with seeds), chronic macrocytosis with anemia and leukopenia.  He recently on 12/28/2015 has labs with his PCP including CBC which showed severe anemia with hgb of 7.8 MCV 123, nl PLT 185k, mild leucopenia 3.4k.  He has had significant macrocytosis since atleast 2013 but possibly longer/lifelong. However previously had hgb of 12.1 in 02/2013 and 13.2 in 05/2011. He has had wbc count of 2.7-3.4k in this time period.  Patient is a limited historian. Notes no significant dizziness or lightheadedness. Some fatigue. No weight loss. Night sweats. No bone pains. Denies any other new urinary symptoms. He is not reporting any other focal symptoms. He is legal guardian does not suggest any other complaints.  CURRENT THERAPY:300 mcg Aranesp every 2 weeks   INTERVAL HISTORY  Patient is here for f/u of his lab results accompanied by his legal guardian. He states he is doing well overall. He reports no acute new concerns. Hgb is 7.9 today, platelets 52k and WBC count of 4k with ANC 1.8k. Has been tolerating the Aranesp without any new concerns. Notes no symptoms from his anemia currently.  On ROS, pt reports intermittent rectal bleeding and denies loss of appetite, nose bleeds, gum bleeds and any other accompanying symptoms.  MEDICAL HISTORY:  Past Medical History:  Diagnosis Date  . Cancer [facility])    prostate  . Leukopenia   . Macrocytosis   . Mental retardation   .  Thrombocytopenia ([place])     SURGICAL HISTORY: status post seed implantation for prostate cancer  bilateral inguinal herniorrhaphies   SOCIAL HISTORY: Social History   Socioeconomic History  . Marital status: Married    Spouse name: Not on file  . Number of children: Not on file  . Years of education: Not on file  . Highest education level: Not on file  Social Needs  . Financial resource strain: Not on file  . Food insecurity - worry: Not on file  . Food insecurity - inability: Not on file  . Transportation needs - medical: Not on file  . Transportation needs - non-medical: Not on file  Occupational History  . Not on file  Tobacco Use  . Smoking status: Never Smoker  . Smokeless tobacco: Never Used  Substance and Sexual Activity  . Alcohol use: No  . Drug use: No  . Sexual activity: Not on file  Other Topics Concern  . Not on file  Social History Narrative  . Not on file  Never Smoked  patient has never been employed due to mental retardation  he lives with a court appointed guardian - [name] Smoking Status: never  FAMILY HISTORY:  father died at age 55  mother died young. A suicide death  No siblings   ALLERGIES:  has No Known Allergies.  MEDICATIONS:  Current Outpatient Medications  Medication Sig Dispense Refill  . b complex vitamins capsule Take 1 capsule by mouth daily.    . cetirizine (ZYRTEC) 10 MG tablet Take 10  mg by mouth daily.    . diphenoxylate-atropine (LOMOTIL) 2.5-0.025 MG tablet Take 1 tablet by mouth 4 (four) times daily as needed for diarrhea or loose stools. 30 tablet 0  . diphenhydrAMINE (BENADRYL ALLERGY) 25 mg capsule Take 50 mg by mouth every 6 (six) hours as needed.    . Multiple Vitamin (MULTIVITAMIN) tablet Take 1 tablet by mouth daily.       No current facility-administered medications for this visit.     REVIEW OF SYSTEMS:    A 10+ POINT REVIEW OF SYSTEMS WAS OBTAINED including neurology, dermatology, psychiatry, cardiac,  respiratory, lymph, extremities, GI, GU, Musculoskeletal, constitutional, breasts, reproductive, HEENT.  All pertinent positives are noted in the HPI.  All others are negative.  PHYSICAL EXAMINATION:  ECOG PERFORMANCE STATUS: 1 - Symptomatic but completely ambulatory  . Vitals:   01/23/17 1215  BP: (!) 109/59  Pulse: 71  Resp: 18  SpO2: 100%   Filed Weights   01/23/17 1215  Weight: 137 lb 8 oz (62.4 kg)   .Body mass index is 25.98 kg/m.  GENERAL:alert, in no acute distress and comfortable, appears pale SKIN: no acute rashes EYES: Conjunctivae with pallor no scleral icterus OROPHARYNX:no exudate, no erythema and lips, buccal mucosa, and tongue normal  NECK: supple, no JVD, thyroid normal size, non-tender, without nodularity LYMPH:  no palpable lymphadenopathy in the cervical, axillary or inguinal LUNGS: clear to auscultation with normal respiratory effort HEART: regular rate & rhythm,  no murmurs and no lower extremity edema ABDOMEN: abdomen soft, non-tender, normoactive bowel sounds , no clinically palpable hepato-splenomegaly. Musculoskeletal: no cyanosis of digits and no clubbing  PSYCH: alert , significant cognitive issues due to h/o mental retardation NEURO: no focal motor/sensory deficits  LABORATORY DATA:  I have reviewed the data as listed  .[name] CBC Latest Ref Rng & Units 01/23/2017 01/07/2017 12/24/2016  WBC 4.0 - 10.3 10e3/uL 4.0 3.7(L) 4.3  Hemoglobin 13.0 - 17.1 g/dL 7.9(L) 7.8(L) 7.9(L)  Hematocrit 38.4 - 49.9 % 24.5(L) 24.2(L) 24.3(L)  Platelets 140 - 400 10e3/uL 52(L) 56(L) 77(L)   . CBC    Component Value Date/Time   WBC 4.0 01/23/2017 1135   WBC 4.3 10/01/2016 0921   RBC 2.15 (L) 01/23/2017 1135   RBC 2.30 (L) 10/01/2016 0921   HGB 7.9 (L) 01/23/2017 1135   HCT 24.5 (L) 01/23/2017 1135   PLT 52 (L) 01/23/2017 1135   MCV 114.0 (H) 01/23/2017 1135   MCH 36.7 (H) 01/23/2017 1135   MCH 37.4 (H) 10/01/2016 0921   MCHC 32.2 01/23/2017 1135   MCHC 33.6  10/01/2016 0921   RDW 28.5 (H) 12/24/2016 0927   LYMPHSABS 0.7 (L) 01/23/2017 1135   MONOABS 1.4 (H) 01/23/2017 1135   EOSABS 0.0 01/23/2017 1135   BASOSABS 0.1 01/23/2017 1135      . CMP Latest Ref Rng & Units 01/23/2017 01/07/2017 11/30/2016  Glucose 70 - 140 mg/dl 84 84 91  BUN 7.0 - 26.0 mg/dL 19.4 16.2 20.1  Creatinine 0.7 - 1.3 mg/dL 1.2 1.2 1.2  Sodium 136 - 145 mEq/L 138 138 143  Potassium 3.5 - 5.1 mEq/L 4.2 3.9 4.0  Chloride 96 - 112 mEq/L - - -  CO2 22 - 29 mEq/L 22 24 21(L)  Calcium 8.4 - 10.4 mg/dL 9.7 9.3 9.8  Total Protein 6.4 - 8.3 g/dL 7.1 7.0 7.1  Total Bilirubin 0.20 - 1.20 mg/dL 1.33(H) 1.35(H) 0.98  Alkaline Phos 40 - 150 U/L 65 64 68  AST 5 - 34 U/L  20 19 16   ALT 0 - 55 U/L 11 13 11    Component     Latest Ref Rng & Units 10/10/2016  Erythropoietin     2.6 - 18.5 mIU/mL 108.1 (H)             RADIOGRAPHIC STUDIES: I have personally reviewed the radiological images as listed and agreed with the findings in the report. No results found.  ASSESSMENT & PLAN:   71 year old very pleasant gentleman with mental retardation here with his legal guardian for evaluation of   1) Severe Macrocytic Anemia  Hemoglobin of 7.8 on 12/28/2015. Hemoglobin of 7.7 as of 10/26/16.  Previously, his Hgb appeared to be improving to 8.4 then to 8.9 and now up to 9.6 with improving MCV and RDW while on multivitamins and B complex. -Hgb at 10, MCV at 103.4 and RDW at 25.2 as of 11/30/2016 (s/p PRBC transfusion) -Hgb at 7.9, MCV at 34.3, and RDW at 27.6 as of today, 12/21/2016 Multiple myeloma panel and serum free light chains within normal limits TSH within normal limits LDH and haptoglobin suggest no evidence of hemolysis B12 and calcium levels within normal limits. 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 Jak2 neg  BCR-ABL neg  Ferritin levels adequate -Ferritin at 446 as of 11/30/2016  EPO levels 108  2)  Leukopenia - WBC count .  WBC 2.9k, ANC 2k as of 10/10/16 WBC 3.2k as of 10/26/16. ANC 1.8k ANC today 1.6k (11/16/2016) WBC 2.8k as of 11/30/2016 WBC at 4.5k as of today, 12/21/2016   3) Thrombocytopenia - worsening PLT at 100k  as of 10/10/2016.  -PLT at 87 as of 10/26/2016. PLT at 55k on 11/15/2016 -PLT at 42k as of 11/30/2016  -PLT at 62 as of 12/21/2016  No evidence of abnormal bruising or bleeding.  PLAN Plan  -I discussed available lab results.  His WBC count has improved to 4.5K and platelets are stable at 52k. Hgb is stable at 7.9 and he is not having any symptoms from his anemia. -Conitnue on Aranesp 336mg q2weeks for now to see if this might reduce his need for PRBC transfusions. -transfuse prbc for hgb<7 or if symptomatic -Will have the patient follow up for Aranesp injection q2weeks -Will monitor labs every 2 weeks -patient and his guardian were recommended to call [place] they note increasing fatigue or dizziness of DOE.   Continue Aranesp q2weeks x 12 doses Labs q2weeks RTC with Dr KIrene Limboin 2 months    All of the patients questions were answered with apparent satisfaction. The patient knows to call the clinic with any problems, questions or concerns.  I spent 20 minutes counseling the patient face to face. The total time spent in the appointment was 25 minutes and more than 50% was on counseling and direct patient cares.    GSullivan LoneMD MPutnam LakeAAHIVMS [facility] Surgical InstituteCNeurological [facility] Amb[facility] Surgical Center LLCHematology/Oncology Physician [place] (Office):       [phone](Work cell):  [phone](Fax):           [phone] This document serves as a record of services personally performed by GSullivan Lone MD. It was created on his behalf by LAlean Rinne a trained medical scribe. The creation of this record is based on the scribe's personal observations and the provider's statements to them. This document has been checked and approved by the attending provider.   .I have reviewed the above  documentation  for accuracy and completeness, and I agree with the above. Brunetta Genera MD MS

## 2017-01-23 NOTE — Patient Instructions (Signed)

## 2017-02-01 DIAGNOSIS — K625 Hemorrhage of anus and rectum: Secondary | ICD-10-CM | POA: Diagnosis not present

## 2017-02-08 ENCOUNTER — Other Ambulatory Visit (HOSPITAL_BASED_OUTPATIENT_CLINIC_OR_DEPARTMENT_OTHER): Payer: Medicare Other

## 2017-02-08 ENCOUNTER — Ambulatory Visit (HOSPITAL_BASED_OUTPATIENT_CLINIC_OR_DEPARTMENT_OTHER): Payer: Medicare Other

## 2017-02-08 DIAGNOSIS — D696 Thrombocytopenia, unspecified: Secondary | ICD-10-CM

## 2017-02-08 DIAGNOSIS — D6182 Myelophthisis: Secondary | ICD-10-CM

## 2017-02-08 DIAGNOSIS — D649 Anemia, unspecified: Secondary | ICD-10-CM

## 2017-02-08 DIAGNOSIS — D7581 Myelofibrosis: Secondary | ICD-10-CM

## 2017-02-08 DIAGNOSIS — D469 Myelodysplastic syndrome, unspecified: Secondary | ICD-10-CM

## 2017-02-08 LAB — CBC & DIFF AND RETIC
BASO%: 1.9 % (ref 0.0–2.0)
Basophils Absolute: 0.1 10*3/uL (ref 0.0–0.1)
EOS%: 0 % (ref 0.0–7.0)
Eosinophils Absolute: 0 10*3/uL (ref 0.0–0.5)
HEMATOCRIT: 23.4 % — AB (ref 38.4–49.9)
HGB: 7.4 g/dL — ABNORMAL LOW (ref 13.0–17.1)
Immature Retic Fract: 24.5 % — ABNORMAL HIGH (ref 3.00–10.60)
LYMPH#: 0.4 10*3/uL — AB (ref 0.9–3.3)
LYMPH%: 10.3 % — ABNORMAL LOW (ref 14.0–49.0)
MCH: 37.6 pg — AB (ref 27.2–33.4)
MCHC: 31.6 g/dL — AB (ref 32.0–36.0)
MCV: 118.8 fL — AB (ref 79.3–98.0)
MONO#: 2.2 10*3/uL — ABNORMAL HIGH (ref 0.1–0.9)
MONO%: 50.8 % — ABNORMAL HIGH (ref 0.0–14.0)
NEUT#: 1.6 10*3/uL (ref 1.5–6.5)
NEUT%: 37 % — AB (ref 39.0–75.0)
Platelets: 54 10*3/uL — ABNORMAL LOW (ref 140–400)
RBC: 1.97 10*6/uL — ABNORMAL LOW (ref 4.20–5.82)
RDW: 26.6 % — AB (ref 11.0–14.6)
RETIC %: 6.55 % — AB (ref 0.80–1.80)
Retic Ct Abs: 129.04 10*3/uL — ABNORMAL HIGH (ref 34.80–93.90)
WBC: 4.3 10*3/uL (ref 4.0–10.3)
nRBC: 7 % — ABNORMAL HIGH (ref 0–0)

## 2017-02-08 LAB — COMPREHENSIVE METABOLIC PANEL
ALT: 10 U/L (ref 0–55)
ANION GAP: 9 meq/L (ref 3–11)
AST: 16 U/L (ref 5–34)
Albumin: 3.6 g/dL (ref 3.5–5.0)
Alkaline Phosphatase: 61 U/L (ref 40–150)
BUN: 16.2 mg/dL (ref 7.0–26.0)
CALCIUM: 9.7 mg/dL (ref 8.4–10.4)
CHLORIDE: 112 meq/L — AB (ref 98–109)
CO2: 21 mEq/L — ABNORMAL LOW (ref 22–29)
Creatinine: 1.3 mg/dL (ref 0.7–1.3)
EGFR: 56 mL/min/{1.73_m2} — AB (ref >60)
Glucose: 87 mg/dl (ref 70–140)
POTASSIUM: 4.2 meq/L (ref 3.5–5.1)
Sodium: 142 mEq/L (ref 136–145)
Total Bilirubin: 1.14 mg/dL (ref 0.20–1.20)
Total Protein: 6.8 g/dL (ref 6.4–8.3)

## 2017-02-08 LAB — TECHNOLOGIST REVIEW

## 2017-02-08 MED ORDER — DARBEPOETIN ALFA 300 MCG/0.6ML IJ SOSY
300.0000 ug | PREFILLED_SYRINGE | Freq: Once | INTRAMUSCULAR | Status: AC
  Administered 2017-02-08: 300 ug via SUBCUTANEOUS

## 2017-02-22 ENCOUNTER — Ambulatory Visit (HOSPITAL_BASED_OUTPATIENT_CLINIC_OR_DEPARTMENT_OTHER): Payer: Medicare Other

## 2017-02-22 ENCOUNTER — Other Ambulatory Visit (HOSPITAL_BASED_OUTPATIENT_CLINIC_OR_DEPARTMENT_OTHER): Payer: Medicare Other

## 2017-02-22 VITALS — BP 123/62 | HR 78 | Temp 97.9°F | Resp 20

## 2017-02-22 DIAGNOSIS — D7581 Myelofibrosis: Secondary | ICD-10-CM

## 2017-02-22 DIAGNOSIS — D469 Myelodysplastic syndrome, unspecified: Secondary | ICD-10-CM | POA: Diagnosis not present

## 2017-02-22 DIAGNOSIS — D649 Anemia, unspecified: Secondary | ICD-10-CM

## 2017-02-22 DIAGNOSIS — D696 Thrombocytopenia, unspecified: Secondary | ICD-10-CM

## 2017-02-22 LAB — MANUAL DIFFERENTIAL
ALC: 1.1 10*3/uL (ref 0.9–3.3)
ANC (CHCC MAN DIFF): 2.4 10*3/uL (ref 1.5–6.5)
BLASTS: 7 % — AB (ref 0–0)
Band Neutrophils: 15 % — ABNORMAL HIGH (ref 0–10)
EOS: 1 % (ref 0–7)
LYMPH: 19 % (ref 14–49)
MONO: 29 % — AB (ref 0–14)
MYELOCYTES: 2 % — AB (ref 0–0)
Metamyelocytes: 1 % — ABNORMAL HIGH (ref 0–0)
NRBC: 4 % — AB (ref 0–0)
PLT EST: DECREASED
SEG: 26 % — AB (ref 38–77)

## 2017-02-22 LAB — CBC & DIFF AND RETIC
HCT: 23.7 % — ABNORMAL LOW (ref 38.4–49.9)
HEMOGLOBIN: 7.6 g/dL — AB (ref 13.0–17.1)
IMMATURE RETIC FRACT: 21.6 % — AB (ref 3.00–10.60)
MCH: 39 pg — ABNORMAL HIGH (ref 27.2–33.4)
MCHC: 32.1 g/dL (ref 32.0–36.0)
MCV: 121.5 fL — AB (ref 79.3–98.0)
Platelets: 50 10*3/uL — ABNORMAL LOW (ref 140–400)
RBC: 1.95 10*6/uL — AB (ref 4.20–5.82)
RDW: 24.5 % — AB (ref 11.0–14.6)
RETIC %: 7.63 % — AB (ref 0.80–1.80)
RETIC CT ABS: 148.79 10*3/uL — AB (ref 34.80–93.90)
WBC: 5.5 10*3/uL (ref 4.0–10.3)

## 2017-02-22 MED ORDER — DARBEPOETIN ALFA 300 MCG/0.6ML IJ SOSY
300.0000 ug | PREFILLED_SYRINGE | Freq: Once | INTRAMUSCULAR | Status: AC
  Administered 2017-02-22: 300 ug via SUBCUTANEOUS

## 2017-02-22 NOTE — Patient Instructions (Signed)

## 2017-03-06 DIAGNOSIS — K5140 Inflammatory polyps of colon without complications: Secondary | ICD-10-CM | POA: Diagnosis not present

## 2017-03-06 DIAGNOSIS — K625 Hemorrhage of anus and rectum: Secondary | ICD-10-CM | POA: Diagnosis not present

## 2017-03-06 DIAGNOSIS — D126 Benign neoplasm of colon, unspecified: Secondary | ICD-10-CM | POA: Diagnosis not present

## 2017-03-08 ENCOUNTER — Telehealth: Payer: Self-pay

## 2017-03-08 ENCOUNTER — Inpatient Hospital Stay: Payer: Medicare Other

## 2017-03-08 ENCOUNTER — Inpatient Hospital Stay: Payer: Medicare Other | Attending: Hematology

## 2017-03-08 VITALS — BP 103/52 | HR 72 | Temp 97.9°F | Resp 18

## 2017-03-08 DIAGNOSIS — D469 Myelodysplastic syndrome, unspecified: Secondary | ICD-10-CM

## 2017-03-08 DIAGNOSIS — D7581 Myelofibrosis: Secondary | ICD-10-CM

## 2017-03-08 DIAGNOSIS — D6182 Myelophthisis: Secondary | ICD-10-CM

## 2017-03-08 LAB — CBC WITH DIFFERENTIAL/PLATELET
BASOS PCT: 0 %
Band Neutrophils: 0 %
Basophils Absolute: 0 10*3/uL (ref 0.0–0.1)
Blasts: 5 %
EOS PCT: 0 %
Eosinophils Absolute: 0 10*3/uL (ref 0.0–0.5)
HCT: 22.2 % — ABNORMAL LOW (ref 38.4–49.9)
Hemoglobin: 7.2 g/dL — ABNORMAL LOW (ref 13.0–17.1)
LYMPHS ABS: 1.1 10*3/uL (ref 0.9–3.3)
LYMPHS PCT: 18 %
MCH: 40.1 pg — AB (ref 27.2–33.4)
MCHC: 32.3 g/dL (ref 32.0–36.0)
MCV: 123.9 fL — ABNORMAL HIGH (ref 79.3–98.0)
Metamyelocytes Relative: 2 %
Monocytes Absolute: 1.3 10*3/uL — ABNORMAL HIGH (ref 0.1–0.9)
Monocytes Relative: 21 %
Myelocytes: 1 %
NEUTROS PCT: 53 %
NRBC: 4 /100{WBCs} — AB
Neutro Abs: 3.4 10*3/uL (ref 1.5–6.5)
OTHER: 0 %
PLATELETS: 54 10*3/uL — AB (ref 140–400)
Promyelocytes Absolute: 0 %
RBC: 1.8 MIL/uL — ABNORMAL LOW (ref 4.20–5.82)
RDW: 25.2 % — AB (ref 11.0–15.6)
WBC: 6.1 10*3/uL (ref 4.0–10.3)

## 2017-03-08 LAB — COMPREHENSIVE METABOLIC PANEL
ALT: 10 U/L (ref 0–55)
ANION GAP: 8 (ref 3–11)
AST: 14 U/L (ref 5–34)
Albumin: 3.3 g/dL — ABNORMAL LOW (ref 3.5–5.0)
Alkaline Phosphatase: 62 U/L (ref 40–150)
BUN: 14 mg/dL (ref 7–26)
CHLORIDE: 111 mmol/L — AB (ref 98–109)
CO2: 21 mmol/L — AB (ref 22–29)
Calcium: 8.8 mg/dL (ref 8.4–10.4)
Creatinine, Ser: 1.29 mg/dL (ref 0.70–1.30)
GFR calc Af Amer: >60 mL/min (ref >60)
GFR calc non Af Amer: 53 mL/min — ABNORMAL LOW (ref >60)
Glucose, Bld: 84 mg/dL (ref 70–140)
Potassium: 4.2 mmol/L (ref 3.5–5.1)
SODIUM: 140 mmol/L (ref 136–145)
Total Bilirubin: 1.2 mg/dL (ref 0.2–1.2)
Total Protein: 6.6 g/dL (ref 6.4–8.3)

## 2017-03-08 MED ORDER — DARBEPOETIN ALFA 300 MCG/0.6ML IJ SOSY
PREFILLED_SYRINGE | INTRAMUSCULAR | Status: AC
  Filled 2017-03-08: qty 0.6

## 2017-03-08 MED ORDER — DARBEPOETIN ALFA 300 MCG/0.6ML IJ SOSY
300.0000 ug | PREFILLED_SYRINGE | Freq: Once | INTRAMUSCULAR | Status: AC
  Administered 2017-03-08: 300 ug via SUBCUTANEOUS

## 2017-03-08 NOTE — Telephone Encounter (Signed)
Pt Hgb 7.2 today. Per Dr. Irene Limbo, pt only needs blood if symptomatic or Hgb < 7.0. Spoke with Mr. Domingo Cocking, pt's guardian, and pt denies low energy, dizziness, SOB, or any other symptoms related to his anemia. Called [facility] to let him know that pt would not be receiving blood.

## 2017-03-12 DIAGNOSIS — D126 Benign neoplasm of colon, unspecified: Secondary | ICD-10-CM | POA: Diagnosis not present

## 2017-03-12 DIAGNOSIS — K5140 Inflammatory polyps of colon without complications: Secondary | ICD-10-CM | POA: Diagnosis not present

## 2017-03-21 NOTE — Progress Notes (Signed)
HEMATOLOGY/ONCOLOGY CLINIC NOTE  Date of Service: 03/22/17  Patient Care Team: Reginald Lor, MD as PCP - General  CHIEF COMPLAINTS/PURPOSE OF CONSULTATION:  F/u for MDS/PMF  HISTORY OF PRESENTING ILLNESS:   Reginald Daniels is a wonderful 72 y.o. male who has been referred to [place] by Dr .Reginald Daniels, Reginald Sou, MD for evaluation and management of macrocytic anemia.  Patient has a h/o mental retardation (has a legal guardia -Reginald Daniels who was present for visit), prostate cancer (managed by urology, s/p brachy RT with seeds), chronic macrocytosis with anemia and leukopenia.  He recently on 12/28/2015 has labs with his PCP including CBC which showed severe anemia with hgb of 7.8 MCV 123, nl PLT 185k, mild leucopenia 3.4k.  He has had significant macrocytosis since atleast 2013 but possibly longer/lifelong. However previously had hgb of 12.1 in 02/2013 and 13.2 in 05/2011. He has had wbc count of 2.7-3.4k in this time period.  Patient is a limited historian. Notes no significant dizziness or lightheadedness. Some fatigue. No weight loss. Night sweats. No bone pains. Denies any other new urinary symptoms. He is not reporting any other focal symptoms. He is legal guardian does not suggest any other complaints.  CURRENT THERAPY: 300 mcg Aranesp every 2 weeks   INTERVAL HISTORY  Patient is here for management and evaluation of his macrocytic anemia and is accompanied by his legal guardian. The patient's last visit with [place] was on 01/23/17. The pt reports that he is doing well overall, enjoys eating but has not enjoyed the cold weather this morning. His legal guardian reports that they will be joining a gym soon. The pt reports that his Christmas was enjoyable staying at home.   hgb relatively stable and has not recently required any PRBC transfusions. No evidence of overt bleeding. No CP/SOB/lightheadedness or dizziness.  Lab results today (03/22/17) of CBC, CMP, and  Reticulocytes is as follows: all values are WNL except for RBC at 2.04, Hgb at 8.3, HCT at 25.0, MCV at 122.2, MCH at 40.4, RDW at 25.0 and Platelets at 42k.  On review of systems, pt reports regular bowel movements and denies bleeding, gum bleeds, nose bleeds, fatigue, abdominal pains, leg swelling.    MEDICAL HISTORY:  Past Medical History:  Diagnosis Date  . Cancer [facility])    prostate  . Leukopenia   . Macrocytosis   . Mental retardation   . Thrombocytopenia ([place])     SURGICAL HISTORY: status post seed implantation for prostate cancer  bilateral inguinal herniorrhaphies   SOCIAL HISTORY: Social History   Socioeconomic History  . Marital status: Married    Spouse name: Not on file  . Number of children: Not on file  . Years of education: Not on file  . Highest education level: Not on file  Social Needs  . Financial resource strain: Not on file  . Food insecurity - worry: Not on file  . Food insecurity - inability: Not on file  . Transportation needs - medical: Not on file  . Transportation needs - non-medical: Not on file  Occupational History  . Not on file  Tobacco Use  . Smoking status: Never Smoker  . Smokeless tobacco: Never Used  Substance and Sexual Activity  . Alcohol use: No  . Drug use: No  . Sexual activity: Not on file  Other Topics Concern  . Not on file  Social History Narrative  . Not on file  Never Smoked  patient has never been employed  due to mental retardation  he lives with a court appointed guardian - Reginald Daniels Smoking Status: never  FAMILY HISTORY:  father died at age 19  mother died young. A suicide death  No siblings   ALLERGIES:  has No Known Allergies.  MEDICATIONS:  Current Outpatient Medications  Medication Sig Dispense Refill  . b complex vitamins capsule Take 1 capsule by mouth daily.    . cetirizine (ZYRTEC) 10 MG tablet Take 10 mg by mouth daily.    . diphenhydrAMINE (BENADRYL ALLERGY) 25 mg capsule Take 50 mg by  mouth every 6 (six) hours as needed.    . diphenoxylate-atropine (LOMOTIL) 2.5-0.025 MG tablet Take 1 tablet by mouth 4 (four) times daily as needed for diarrhea or loose stools. 30 tablet 0  . Multiple Vitamin (MULTIVITAMIN) tablet Take 1 tablet by mouth daily.       No current facility-administered medications for this visit.     REVIEW OF SYSTEMS:    A 10+ POINT REVIEW OF SYSTEMS WAS OBTAINED including neurology, dermatology, psychiatry, cardiac, respiratory, lymph, extremities, GI, GU, Musculoskeletal, constitutional, breasts, reproductive, HEENT.  All pertinent positives are noted in the HPI.  All others are negative.  PHYSICAL EXAMINATION:  ECOG PERFORMANCE STATUS: 1 - Symptomatic but completely ambulatory  . Vitals:   03/22/17 0843  BP: (!) 112/46  Pulse: 71  Resp: 18  SpO2: 100%   Filed Weights   03/22/17 0843  Weight: 133 lb 12.8 oz (60.7 kg)   .Body mass index is 25.28 kg/m.  GENERAL:alert, in no acute distress and comfortable, appears pale SKIN: no acute rashes EYES: Conjunctivae with pallor no scleral icterus OROPHARYNX:no exudate, no erythema and lips, buccal mucosa, and tongue normal  NECK: supple, no JVD, thyroid normal size, non-tender, without nodularity LYMPH:  no palpable lymphadenopathy in the cervical, axillary or inguinal LUNGS: clear to auscultation with normal respiratory effort HEART: regular rate & rhythm,  no murmurs and no lower extremity edema ABDOMEN: abdomen soft, non-tender, normoactive bowel sounds , no clinically palpable hepato-splenomegaly. Musculoskeletal: no cyanosis of digits and no clubbing  PSYCH: alert , significant cognitive issues due to h/o mental retardation NEURO: no focal motor/sensory deficits  LABORATORY DATA:  I have reviewed the data as listed  .[name] CBC Latest Ref Rng & Units 03/22/2017 03/08/2017 02/22/2017  WBC 4.0 - 10.3 K/uL 6.0 6.1 5.5  Hemoglobin 13.0 - 17.1 g/dL 8.3(L) 7.2(L) 7.6(L)  Hematocrit 38.4 - 49.9 % 25.0(L)  22.2(L) 23.7(L)  Platelets 140 - 400 K/uL 42(L) 54(L) 50(L)   . CBC    Component Value Date/Time   WBC 6.0 03/22/2017 0814   RBC 2.04 (L) 03/22/2017 0814   HGB 8.3 (L) 03/22/2017 0814   HGB 7.6 (L) 02/22/2017 0812   HCT 25.0 (L) 03/22/2017 0814   HCT 23.7 (L) 02/22/2017 0812   PLT 42 (L) 03/22/2017 0814   PLT 50 (L) 02/22/2017 0812   MCV 122.2 (H) 03/22/2017 0814   MCV 121.5 (H) 02/22/2017 0812   MCH 40.4 (H) 03/22/2017 0814   MCHC 33.1 03/22/2017 0814   RDW 25.0 (H) 03/22/2017 0814   RDW 24.5 (H) 02/22/2017 0812   LYMPHSABS 1.0 03/22/2017 0814   LYMPHSABS 0.4 (L) 02/08/2017 0805   MONOABS 2.5 (H) 03/22/2017 0814   MONOABS 2.2 (H) 02/08/2017 0805   EOSABS 0.0 03/22/2017 0814   EOSABS 0.0 02/08/2017 0805   BASOSABS 0.1 03/22/2017 0814   BASOSABS 0.1 02/08/2017 0805      . CMP Latest Ref Rng &  Units 03/08/2017 02/08/2017 01/23/2017  Glucose 70 - 140 mg/dL 84 87 84  BUN 7 - 26 mg/dL 14 16.2 19.4  Creatinine 0.70 - 1.30 mg/dL 1.29 1.3 1.2  Sodium 136 - 145 mmol/L 140 142 138  Potassium 3.5 - 5.1 mmol/L 4.2 4.2 4.2  Chloride 98 - 109 mmol/L 111(H) - -  CO2 22 - 29 mmol/L 21(L) 21(L) 22  Calcium 8.4 - 10.4 mg/dL 8.8 9.7 9.7  Total Protein 6.4 - 8.3 g/dL 6.6 6.8 7.1  Total Bilirubin 0.2 - 1.2 mg/dL 1.2 1.14 1.33(H)  Alkaline Phos 40 - 150 U/L 62 61 65  AST 5 - 34 U/L 14 16 20   ALT 0 - 55 U/L 10 10 11    Component     Latest Ref Rng & Units 10/10/2016  Erythropoietin     2.6 - 18.5 mIU/mL 108.1 (H)             RADIOGRAPHIC STUDIES: I have personally reviewed the radiological images as listed and agreed with the findings in the report. No results found.  ASSESSMENT & PLAN:   72 year old very pleasant gentleman with mental retardation here with his legal guardian for evaluation of   1) Severe Macrocytic Anemia  Hemoglobin of 7.8 on 12/28/2015. Hemoglobin of 7.7 as of 10/26/16.  Previously, his Hgb appeared to be improving to 8.4 then to 8.9 and now up to 9.6  with improving MCV and RDW while on multivitamins and B complex. -Hgb at 10, MCV at 103.4 and RDW at 25.2 as of 11/30/2016 (s/p PRBC transfusion) -Hgb at 7.9, MCV at 34.3, and RDW at 27.6 as of 12/21/2016 -Hgb at 8.3 , MCV at 122.2, and RDW at 25.0 as of 03/22/17. -If ferritin level is less than 100 we will need to supplement iron. Ferritin labs pending 03/22/17 -BP today 03/22/17 is good, indicating pt continues to tolerate aranesp well  Multiple myeloma panel and serum free light chains within normal limits TSH within normal limits LDH and haptoglobin suggest no evidence of hemolysis B12 and calcium levels within normal limits. 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 Jak2 neg  BCR-ABL neg  Ferritin levels adequate currently @ 250 -Ferritin at 446 as of 11/30/2016  EPO levels 108  2) Leukopenia - WBC count .  WBC 2.9k, ANC 2k as of 10/10/16 WBC 3.2k as of 10/26/16. ANC 1.8k ANC today 1.6k (11/16/2016) WBC 2.8k as of 11/30/2016 WBC at 4.5k as of 12/21/2016 WBC at 6.0k as of 03/22/17  3) Thrombocytopenia  -PLT at 87 as of 10/26/2016. PLT at 55k on 11/15/2016 -PLT at 42k as of 11/30/2016  -PLT at 62 as of 12/21/2016  -PLT at 42k as of 03/22/17 No evidence of abnormal bruising or bleeding.  PLAN -I discussed available lab results.  His WBC count has improved to 6.0k and platelets are  at 42k. Hgb is stable at 8.3 and he is not having any symptoms from his anemia. -Conitnue on Aranesp 351mg q2weeks for now to see if this might reduce his need for PRBC transfusions. -transfuse prbc for hgb<7 or if symptomatic -Will have the patient follow up for Aranesp injection q2weeks -Will monitor labs every 2 weeks -Patient and his guardian were recommended to call [place] note increasing fatigue, dizziness, or DOE. -no indication for IV Iron at this time (ferritin 250)   -Continue Aranesp q2weeks as per orders. Plz schedule next 7 doses -RTC  with  Dr Irene Limbo in 3 months -labs q2weeks  All of the patients questions were answered with apparent satisfaction. The patient knows to call the clinic with any problems, questions or concerns.  I spent 15 minutes counseling the patient face to face. The total time spent in the appointment was 15 minutes and more than 50% was on counseling and direct patient cares.    Sullivan Lone MD [place] AAHIVMS [facility] Hematology/Oncology Physician [place]  (Office):       [phone] (Work cell):  [phone] (Fax):           [phone]  This document serves as a record of services personally performed by Sullivan Lone, MD. It was created on his behalf by Baldwin Jamaica, a trained medical scribe. The creation of this record is based on the scribe's personal observations and the provider's statements to them.   .I have reviewed the above documentation for accuracy and completeness, and I agree with the above. Brunetta Genera MD MS

## 2017-03-22 ENCOUNTER — Inpatient Hospital Stay: Payer: Medicare Other | Attending: Hematology

## 2017-03-22 ENCOUNTER — Encounter: Payer: Self-pay | Admitting: Hematology

## 2017-03-22 ENCOUNTER — Inpatient Hospital Stay: Payer: Medicare Other

## 2017-03-22 ENCOUNTER — Inpatient Hospital Stay (HOSPITAL_BASED_OUTPATIENT_CLINIC_OR_DEPARTMENT_OTHER): Payer: Medicare Other | Admitting: Hematology

## 2017-03-22 VITALS — BP 112/46 | HR 71 | Resp 18 | Ht 61.0 in | Wt 133.8 lb

## 2017-03-22 DIAGNOSIS — D72819 Decreased white blood cell count, unspecified: Secondary | ICD-10-CM | POA: Diagnosis not present

## 2017-03-22 DIAGNOSIS — D539 Nutritional anemia, unspecified: Secondary | ICD-10-CM

## 2017-03-22 DIAGNOSIS — D696 Thrombocytopenia, unspecified: Secondary | ICD-10-CM

## 2017-03-22 DIAGNOSIS — D469 Myelodysplastic syndrome, unspecified: Secondary | ICD-10-CM | POA: Diagnosis not present

## 2017-03-22 DIAGNOSIS — D7581 Myelofibrosis: Secondary | ICD-10-CM

## 2017-03-22 DIAGNOSIS — D6182 Myelophthisis: Secondary | ICD-10-CM

## 2017-03-22 DIAGNOSIS — D649 Anemia, unspecified: Secondary | ICD-10-CM

## 2017-03-22 LAB — CBC WITH DIFFERENTIAL/PLATELET
BAND NEUTROPHILS: 0 %
BASOS ABS: 0.1 10*3/uL (ref 0.0–0.1)
BASOS PCT: 1 %
Blasts: 9 %
EOS PCT: 0 %
Eosinophils Absolute: 0 10*3/uL (ref 0.0–0.5)
HCT: 25 % — ABNORMAL LOW (ref 38.4–49.9)
HEMOGLOBIN: 8.3 g/dL — AB (ref 13.0–17.1)
Lymphocytes Relative: 16 %
Lymphs Abs: 1 10*3/uL (ref 0.9–3.3)
MCH: 40.4 pg — AB (ref 27.2–33.4)
MCHC: 33.1 g/dL (ref 32.0–36.0)
MCV: 122.2 fL — ABNORMAL HIGH (ref 79.3–98.0)
METAMYELOCYTES PCT: 0 %
MONO ABS: 2.5 10*3/uL — AB (ref 0.1–0.9)
MONOS PCT: 41 %
MYELOCYTES: 0 %
NEUTROS ABS: 2 10*3/uL (ref 1.5–6.5)
Neutrophils Relative %: 33 %
Other: 0 %
PLATELETS: 42 10*3/uL — AB (ref 140–400)
Promyelocytes Absolute: 0 %
RBC: 2.04 MIL/uL — ABNORMAL LOW (ref 4.20–5.82)
RDW: 25 % — ABNORMAL HIGH (ref 11.0–14.6)
WBC: 6 10*3/uL (ref 4.0–10.3)
nRBC: 11 /100 WBC — ABNORMAL HIGH

## 2017-03-22 LAB — TYPE AND SCREEN
ABO/RH(D): O NEG
Antibody Screen: NEGATIVE

## 2017-03-22 LAB — ABO/RH: ABO/RH(D): O NEG

## 2017-03-22 LAB — FERRITIN: FERRITIN: 250 ng/mL (ref 22–316)

## 2017-03-22 MED ORDER — DARBEPOETIN ALFA 300 MCG/0.6ML IJ SOSY
300.0000 ug | PREFILLED_SYRINGE | Freq: Once | INTRAMUSCULAR | Status: AC
  Administered 2017-03-22: 300 ug via SUBCUTANEOUS

## 2017-04-05 ENCOUNTER — Inpatient Hospital Stay: Payer: Medicare Other

## 2017-04-05 VITALS — BP 109/54 | HR 76 | Temp 97.7°F | Resp 18

## 2017-04-05 DIAGNOSIS — D696 Thrombocytopenia, unspecified: Secondary | ICD-10-CM | POA: Diagnosis not present

## 2017-04-05 DIAGNOSIS — D469 Myelodysplastic syndrome, unspecified: Secondary | ICD-10-CM

## 2017-04-05 DIAGNOSIS — D72819 Decreased white blood cell count, unspecified: Secondary | ICD-10-CM | POA: Diagnosis not present

## 2017-04-05 LAB — CBC WITH DIFFERENTIAL/PLATELET
BASOS ABS: 0.1 10*3/uL (ref 0.0–0.1)
Basophils Relative: 3 %
Eosinophils Absolute: 0 10*3/uL (ref 0.0–0.5)
Eosinophils Relative: 0 %
HEMATOCRIT: 23.7 % — AB (ref 38.4–49.9)
HEMOGLOBIN: 7.6 g/dL — AB (ref 13.0–17.1)
LYMPHS PCT: 11 %
Lymphs Abs: 0.6 10*3/uL — ABNORMAL LOW (ref 0.9–3.3)
MCH: 39.2 pg — ABNORMAL HIGH (ref 27.2–33.4)
MCHC: 32.1 g/dL (ref 32.0–36.0)
MCV: 122.2 fL — ABNORMAL HIGH (ref 79.3–98.0)
Monocytes Absolute: 3 10*3/uL — ABNORMAL HIGH (ref 0.1–0.9)
Monocytes Relative: 52 %
NEUTROS ABS: 1.9 10*3/uL (ref 1.5–6.5)
NEUTROS PCT: 34 %
Platelets: 46 10*3/uL — ABNORMAL LOW (ref 140–400)
RBC: 1.94 MIL/uL — AB (ref 4.20–5.82)
RDW: 22.4 % — ABNORMAL HIGH (ref 11.0–14.6)
WBC: 5.7 10*3/uL (ref 4.0–10.3)
nRBC: 8 /100 WBC — ABNORMAL HIGH

## 2017-04-05 MED ORDER — DARBEPOETIN ALFA 300 MCG/0.6ML IJ SOSY
PREFILLED_SYRINGE | INTRAMUSCULAR | Status: AC
  Filled 2017-04-05: qty 0.6

## 2017-04-05 MED ORDER — DARBEPOETIN ALFA 300 MCG/0.6ML IJ SOSY
300.0000 ug | PREFILLED_SYRINGE | Freq: Once | INTRAMUSCULAR | Status: AC
  Administered 2017-04-05: 300 ug via SUBCUTANEOUS

## 2017-04-19 ENCOUNTER — Inpatient Hospital Stay: Payer: Medicare Other

## 2017-04-19 ENCOUNTER — Inpatient Hospital Stay: Payer: Medicare Other | Attending: Hematology

## 2017-04-19 VITALS — BP 109/55 | HR 72 | Temp 98.0°F | Resp 18

## 2017-04-19 DIAGNOSIS — D469 Myelodysplastic syndrome, unspecified: Secondary | ICD-10-CM | POA: Insufficient documentation

## 2017-04-19 LAB — CBC WITH DIFFERENTIAL/PLATELET
BASOS PCT: 1 %
Basophils Absolute: 0.1 10*3/uL (ref 0.0–0.1)
EOS ABS: 0 10*3/uL (ref 0.0–0.5)
Eosinophils Relative: 0 %
HCT: 23.2 % — ABNORMAL LOW (ref 38.4–49.9)
HEMOGLOBIN: 7.4 g/dL — AB (ref 13.0–17.1)
Lymphocytes Relative: 20 %
Lymphs Abs: 1.1 10*3/uL (ref 0.9–3.3)
MCH: 38.9 pg — AB (ref 27.2–33.4)
MCHC: 31.9 g/dL — ABNORMAL LOW (ref 32.0–36.0)
MCV: 122.1 fL — ABNORMAL HIGH (ref 79.3–98.0)
Monocytes Absolute: 2.4 10*3/uL — ABNORMAL HIGH (ref 0.1–0.9)
Monocytes Relative: 47 %
NEUTROS ABS: 1.7 10*3/uL (ref 1.5–6.5)
NEUTROS PCT: 32 %
PLATELETS: 42 10*3/uL — AB (ref 140–400)
RBC: 1.9 MIL/uL — AB (ref 4.20–5.82)
RDW: 21.8 % — ABNORMAL HIGH (ref 11.0–14.6)
Smear Review: 2
WBC: 5.2 10*3/uL (ref 4.0–10.3)
nRBC: 3 /100 WBC — ABNORMAL HIGH

## 2017-04-19 MED ORDER — DARBEPOETIN ALFA 300 MCG/0.6ML IJ SOSY
PREFILLED_SYRINGE | INTRAMUSCULAR | Status: AC
  Filled 2017-04-19: qty 0.6

## 2017-04-19 MED ORDER — DARBEPOETIN ALFA 300 MCG/0.6ML IJ SOSY: 300.0000 ug | PREFILLED_SYRINGE | Freq: Once | INTRAMUSCULAR | Status: DC

## 2017-05-03 ENCOUNTER — Inpatient Hospital Stay: Payer: Medicare Other

## 2017-05-03 VITALS — BP 108/56 | HR 70 | Temp 97.8°F | Resp 18

## 2017-05-03 DIAGNOSIS — D469 Myelodysplastic syndrome, unspecified: Secondary | ICD-10-CM

## 2017-05-03 LAB — CBC WITH DIFFERENTIAL/PLATELET
BASOS ABS: 0 10*3/uL (ref 0.0–0.1)
BLASTS: 11 %
Band Neutrophils: 11 %
Basophils Relative: 0 %
Eosinophils Absolute: 0 10*3/uL (ref 0.0–0.5)
Eosinophils Relative: 0 %
HEMATOCRIT: 23.3 % — AB (ref 38.4–49.9)
HEMOGLOBIN: 7.5 g/dL — AB (ref 13.0–17.1)
Lymphocytes Relative: 16 %
Lymphs Abs: 1 10*3/uL (ref 0.9–3.3)
MCH: 39.5 pg — ABNORMAL HIGH (ref 27.2–33.4)
MCHC: 32.2 g/dL (ref 32.0–36.0)
MCV: 122.6 fL — ABNORMAL HIGH (ref 79.3–98.0)
METAMYELOCYTES PCT: 1 %
MONOS PCT: 34 %
MYELOCYTES: 0 %
Monocytes Absolute: 2 10*3/uL — ABNORMAL HIGH (ref 0.1–0.9)
NEUTROS PCT: 27 %
NRBC: 7 /100{WBCs} — AB
Neutro Abs: 2.3 10*3/uL (ref 1.5–6.5)
Other: 0 %
Platelets: 42 10*3/uL — ABNORMAL LOW (ref 140–400)
Promyelocytes Absolute: 0 %
RBC: 1.9 MIL/uL — AB (ref 4.20–5.82)
RDW: 21.9 % — ABNORMAL HIGH (ref 11.0–14.6)
WBC: 6 10*3/uL (ref 4.0–10.3)

## 2017-05-03 MED ORDER — DARBEPOETIN ALFA 300 MCG/0.6ML IJ SOSY
PREFILLED_SYRINGE | INTRAMUSCULAR | Status: AC
  Filled 2017-05-03: qty 0.6

## 2017-05-03 MED ORDER — DARBEPOETIN ALFA 300 MCG/0.6ML IJ SOSY
300.0000 ug | PREFILLED_SYRINGE | Freq: Once | INTRAMUSCULAR | Status: AC
  Administered 2017-05-03: 300 ug via SUBCUTANEOUS

## 2017-05-10 DIAGNOSIS — Z86010 Personal history of colonic polyps: Secondary | ICD-10-CM | POA: Diagnosis not present

## 2017-05-10 DIAGNOSIS — Z1211 Encounter for screening for malignant neoplasm of colon: Secondary | ICD-10-CM | POA: Diagnosis not present

## 2017-05-10 DIAGNOSIS — D123 Benign neoplasm of transverse colon: Secondary | ICD-10-CM | POA: Diagnosis not present

## 2017-05-10 DIAGNOSIS — D122 Benign neoplasm of ascending colon: Secondary | ICD-10-CM | POA: Diagnosis not present

## 2017-05-10 DIAGNOSIS — D469 Myelodysplastic syndrome, unspecified: Secondary | ICD-10-CM | POA: Diagnosis not present

## 2017-05-10 DIAGNOSIS — K635 Polyp of colon: Secondary | ICD-10-CM | POA: Diagnosis not present

## 2017-05-10 DIAGNOSIS — K642 Third degree hemorrhoids: Secondary | ICD-10-CM | POA: Diagnosis not present

## 2017-05-10 DIAGNOSIS — Z9889 Other specified postprocedural states: Secondary | ICD-10-CM | POA: Diagnosis not present

## 2017-05-10 DIAGNOSIS — C189 Malignant neoplasm of colon, unspecified: Secondary | ICD-10-CM | POA: Diagnosis not present

## 2017-05-10 DIAGNOSIS — C61 Malignant neoplasm of prostate: Secondary | ICD-10-CM | POA: Diagnosis not present

## 2017-05-17 ENCOUNTER — Ambulatory Visit: Payer: Medicare Other

## 2017-05-17 ENCOUNTER — Other Ambulatory Visit: Payer: Medicare Other

## 2017-05-31 ENCOUNTER — Inpatient Hospital Stay: Payer: Medicare Other | Attending: Hematology

## 2017-05-31 ENCOUNTER — Inpatient Hospital Stay: Payer: Medicare Other

## 2017-05-31 VITALS — BP 102/54 | HR 70 | Temp 98.1°F | Resp 18

## 2017-05-31 DIAGNOSIS — D469 Myelodysplastic syndrome, unspecified: Secondary | ICD-10-CM

## 2017-05-31 LAB — CBC WITH DIFFERENTIAL/PLATELET
BLASTS: 6 %
Band Neutrophils: 21 %
Basophils Absolute: 0 10*3/uL (ref 0.0–0.1)
Basophils Relative: 0 %
Eosinophils Absolute: 0 10*3/uL (ref 0.0–0.5)
Eosinophils Relative: 0 %
HEMATOCRIT: 22.6 % — AB (ref 38.4–49.9)
HEMOGLOBIN: 7.2 g/dL — AB (ref 13.0–17.1)
LYMPHS PCT: 5 %
Lymphs Abs: 0.4 10*3/uL — ABNORMAL LOW (ref 0.9–3.3)
MCH: 39.1 pg — ABNORMAL HIGH (ref 27.2–33.4)
MCHC: 31.9 g/dL — AB (ref 32.0–36.0)
MCV: 122.8 fL — ABNORMAL HIGH (ref 79.3–98.0)
MONOS PCT: 42 %
Metamyelocytes Relative: 2 %
Monocytes Absolute: 3.5 10*3/uL — ABNORMAL HIGH (ref 0.1–0.9)
Myelocytes: 1 %
NEUTROS ABS: 3.9 10*3/uL (ref 1.5–6.5)
NEUTROS PCT: 23 %
NRBC: 7 /100{WBCs} — AB
OTHER: 0 %
PROMYELOCYTES RELATIVE: 0 %
Platelets: 54 10*3/uL — ABNORMAL LOW (ref 140–400)
RBC: 1.84 MIL/uL — AB (ref 4.20–5.82)
RDW: 21.9 % — ABNORMAL HIGH (ref 11.0–14.6)
WBC: 8.3 10*3/uL (ref 4.0–10.3)

## 2017-05-31 MED ORDER — DARBEPOETIN ALFA 300 MCG/0.6ML IJ SOSY
PREFILLED_SYRINGE | INTRAMUSCULAR | Status: AC
  Filled 2017-05-31: qty 0.6

## 2017-05-31 MED ORDER — DARBEPOETIN ALFA 300 MCG/0.6ML IJ SOSY
300.0000 ug | PREFILLED_SYRINGE | Freq: Once | INTRAMUSCULAR | Status: AC
  Administered 2017-05-31: 300 ug via SUBCUTANEOUS

## 2017-06-14 ENCOUNTER — Other Ambulatory Visit: Payer: Self-pay | Admitting: *Deleted

## 2017-06-14 ENCOUNTER — Inpatient Hospital Stay: Payer: Medicare Other

## 2017-06-14 ENCOUNTER — Encounter: Payer: Self-pay | Admitting: *Deleted

## 2017-06-14 VITALS — BP 109/63 | HR 88 | Temp 97.2°F | Resp 20

## 2017-06-14 DIAGNOSIS — D696 Thrombocytopenia, unspecified: Secondary | ICD-10-CM

## 2017-06-14 DIAGNOSIS — D469 Myelodysplastic syndrome, unspecified: Secondary | ICD-10-CM

## 2017-06-14 DIAGNOSIS — D7581 Myelofibrosis: Secondary | ICD-10-CM

## 2017-06-14 DIAGNOSIS — D649 Anemia, unspecified: Secondary | ICD-10-CM

## 2017-06-14 LAB — CBC WITH DIFFERENTIAL (CANCER CENTER ONLY)
BASOS PCT: 3 %
Band Neutrophils: 10 %
Basophils Absolute: 0.4 10*3/uL — ABNORMAL HIGH (ref 0.0–0.1)
Blasts: 10 %
EOS PCT: 1 %
Eosinophils Absolute: 0.1 10*3/uL (ref 0.0–0.5)
HCT: 20 % — ABNORMAL LOW (ref 38.4–49.9)
Hemoglobin: 6.1 g/dL — CL (ref 13.0–17.1)
LYMPHS ABS: 0.5 10*3/uL — AB (ref 0.9–3.3)
LYMPHS PCT: 4 %
MCH: 38.1 pg — AB (ref 27.2–33.4)
MCHC: 30.5 g/dL — AB (ref 32.0–36.0)
MCV: 125 fL — AB (ref 79.3–98.0)
MONO ABS: 3.9 10*3/uL — AB (ref 0.1–0.9)
MONOS PCT: 29 %
Metamyelocytes Relative: 2 %
Myelocytes: 1 %
NEUTROS ABS: 7.1 10*3/uL — AB (ref 1.5–6.5)
NEUTROS PCT: 40 %
NRBC: 12 /100{WBCs} — AB
OTHER: 0 %
PLATELETS: 56 10*3/uL — AB (ref 140–400)
Promyelocytes Relative: 0 %
RBC: 1.6 MIL/uL — AB (ref 4.20–5.82)
RDW: 22 % — ABNORMAL HIGH (ref 11.0–14.6)
WBC Count: 13.4 10*3/uL — ABNORMAL HIGH (ref 4.0–10.3)

## 2017-06-14 LAB — RETICULOCYTES
RBC.: 1.6 MIL/uL — ABNORMAL LOW (ref 4.20–5.82)
RETIC CT PCT: 6.6 % — AB (ref 0.8–1.8)
Retic Count, Absolute: 105.6 10*3/uL — ABNORMAL HIGH (ref 34.8–93.9)

## 2017-06-14 LAB — PREPARE RBC (CROSSMATCH)

## 2017-06-14 LAB — SAMPLE TO BLOOD BANK

## 2017-06-14 MED ORDER — DARBEPOETIN ALFA 300 MCG/0.6ML IJ SOSY
300.0000 ug | PREFILLED_SYRINGE | Freq: Once | INTRAMUSCULAR | Status: AC
  Administered 2017-06-14: 300 ug via SUBCUTANEOUS

## 2017-06-14 MED ORDER — SODIUM CHLORIDE 0.9 % IV SOLN
250.0000 mL | Freq: Once | INTRAVENOUS | Status: AC
  Administered 2017-06-14: 250 mL via INTRAVENOUS

## 2017-06-14 MED ORDER — DIPHENHYDRAMINE HCL 25 MG PO CAPS
25.0000 mg | ORAL_CAPSULE | Freq: Once | ORAL | Status: AC
  Administered 2017-06-14: 25 mg via ORAL

## 2017-06-14 MED ORDER — ACETAMINOPHEN 325 MG PO TABS
650.0000 mg | ORAL_TABLET | Freq: Once | ORAL | Status: AC
  Administered 2017-06-14: 650 mg via ORAL

## 2017-06-14 MED ORDER — DARBEPOETIN ALFA 300 MCG/0.6ML IJ SOSY
PREFILLED_SYRINGE | INTRAMUSCULAR | Status: AC
  Filled 2017-06-14: qty 0.6

## 2017-06-14 NOTE — Progress Notes (Signed)
Hemoglobin noted at 6.1 today. Spoke with Guardian about need of transfusion today. Will type and screen. Planned infuison at 1015 today.

## 2017-06-14 NOTE — Patient Instructions (Signed)

## 2017-06-14 NOTE — Patient Instructions (Signed)

## 2017-06-17 LAB — TYPE AND SCREEN
ABO/RH(D): O NEG
ANTIBODY SCREEN: NEGATIVE
UNIT DIVISION: 0

## 2017-06-17 LAB — BPAM RBC
BLOOD PRODUCT EXPIRATION DATE: 201905152359
ISSUE DATE / TIME: 201904261143
UNIT TYPE AND RH: 9500

## 2017-06-19 ENCOUNTER — Inpatient Hospital Stay: Payer: Medicare Other | Admitting: Hematology

## 2017-06-19 ENCOUNTER — Inpatient Hospital Stay: Payer: Medicare Other | Attending: Hematology

## 2017-06-19 DIAGNOSIS — D696 Thrombocytopenia, unspecified: Secondary | ICD-10-CM | POA: Insufficient documentation

## 2017-06-19 DIAGNOSIS — D539 Nutritional anemia, unspecified: Secondary | ICD-10-CM | POA: Insufficient documentation

## 2017-06-19 DIAGNOSIS — D72819 Decreased white blood cell count, unspecified: Secondary | ICD-10-CM | POA: Insufficient documentation

## 2017-06-19 DIAGNOSIS — D469 Myelodysplastic syndrome, unspecified: Secondary | ICD-10-CM | POA: Insufficient documentation

## 2017-06-19 MED ORDER — DARBEPOETIN ALFA 300 MCG/0.6ML IJ SOSY
PREFILLED_SYRINGE | INTRAMUSCULAR | Status: AC
  Filled 2017-06-19: qty 0.6

## 2017-06-28 ENCOUNTER — Telehealth: Payer: Self-pay | Admitting: Hematology

## 2017-06-28 ENCOUNTER — Inpatient Hospital Stay: Payer: Medicare Other

## 2017-06-28 ENCOUNTER — Telehealth: Payer: Self-pay | Admitting: *Deleted

## 2017-06-28 ENCOUNTER — Other Ambulatory Visit: Payer: Self-pay | Admitting: *Deleted

## 2017-06-28 ENCOUNTER — Other Ambulatory Visit: Payer: Self-pay | Admitting: Hematology

## 2017-06-28 DIAGNOSIS — D469 Myelodysplastic syndrome, unspecified: Secondary | ICD-10-CM

## 2017-06-28 DIAGNOSIS — D539 Nutritional anemia, unspecified: Secondary | ICD-10-CM | POA: Diagnosis not present

## 2017-06-28 DIAGNOSIS — D649 Anemia, unspecified: Secondary | ICD-10-CM

## 2017-06-28 DIAGNOSIS — D696 Thrombocytopenia, unspecified: Secondary | ICD-10-CM | POA: Diagnosis not present

## 2017-06-28 DIAGNOSIS — D72819 Decreased white blood cell count, unspecified: Secondary | ICD-10-CM | POA: Diagnosis not present

## 2017-06-28 DIAGNOSIS — D7581 Myelofibrosis: Secondary | ICD-10-CM

## 2017-06-28 LAB — CBC WITH DIFFERENTIAL (CANCER CENTER ONLY)
BASOS PCT: 1 %
Band Neutrophils: 8 %
Basophils Absolute: 0.1 10*3/uL (ref 0.0–0.1)
Blasts: 9 %
Eosinophils Absolute: 0.1 10*3/uL (ref 0.0–0.5)
Eosinophils Relative: 1 %
HCT: 21.2 % — ABNORMAL LOW (ref 38.4–49.9)
HEMOGLOBIN: 6.7 g/dL — AB (ref 13.0–17.1)
LYMPHS PCT: 10 %
Lymphs Abs: 0.6 10*3/uL — ABNORMAL LOW (ref 0.9–3.3)
MCH: 35.4 pg — AB (ref 27.2–33.4)
MCHC: 31.6 g/dL — AB (ref 32.0–36.0)
MCV: 112.2 fL — AB (ref 79.3–98.0)
MONO ABS: 2.3 10*3/uL — AB (ref 0.1–0.9)
Metamyelocytes Relative: 2 %
Monocytes Relative: 36 %
Myelocytes: 1 %
NEUTROS PCT: 32 %
NRBC: 7 /100{WBCs} — AB
Neutro Abs: 2.7 10*3/uL (ref 1.5–6.5)
OTHER: 0 %
PROMYELOCYTES RELATIVE: 0 %
Platelet Count: 70 10*3/uL — ABNORMAL LOW (ref 140–400)
RBC: 1.89 MIL/uL — ABNORMAL LOW (ref 4.20–5.82)
RDW: 25.6 % — ABNORMAL HIGH (ref 11.0–14.6)
WBC Count: 6.3 10*3/uL (ref 4.0–10.3)

## 2017-06-28 LAB — RETICULOCYTES
RBC.: 1.89 MIL/uL — ABNORMAL LOW (ref 4.20–5.82)
RETIC CT PCT: 4.4 % — AB (ref 0.8–1.8)
Retic Count, Absolute: 83.2 10*3/uL (ref 34.8–93.9)

## 2017-06-28 NOTE — Telephone Encounter (Signed)
Called pt's caretaker re appts - confirmed pt's appts.

## 2017-06-28 NOTE — Telephone Encounter (Signed)
Pt's guardian [name] returned phone call.  Patient can come in Monday for MD visit and blood transfusion.  Reviewed with Dr. Irene Limbo.  Ok to come Monday.  Scheduling message sent.

## 2017-06-28 NOTE — Telephone Encounter (Signed)
Pt here today for lab only.  Hgb 6.7.  Per Dr. Irene Limbo, pt should be scheduled for 1 unit of blood.  Pt also needs f/u for aranesp injection and MD follow up.  Attempted to call pt's guardian Reginald Daniels, person answered stated this was a new number for him, and does not know how to get in touch with Reginald Daniels.  Attempted to call number listed for patient.  No answer.  LVM requesting call back asap.  Call back number provided.

## 2017-07-01 ENCOUNTER — Inpatient Hospital Stay (HOSPITAL_BASED_OUTPATIENT_CLINIC_OR_DEPARTMENT_OTHER): Payer: Medicare Other | Admitting: Hematology

## 2017-07-01 ENCOUNTER — Telehealth: Payer: Self-pay | Admitting: Hematology

## 2017-07-01 ENCOUNTER — Inpatient Hospital Stay: Payer: Medicare Other

## 2017-07-01 VITALS — BP 120/47 | HR 89 | Temp 98.2°F | Resp 17 | Ht 61.0 in | Wt 126.0 lb

## 2017-07-01 VITALS — BP 119/63 | HR 77 | Temp 97.7°F | Resp 18

## 2017-07-01 DIAGNOSIS — D539 Nutritional anemia, unspecified: Secondary | ICD-10-CM | POA: Diagnosis not present

## 2017-07-01 DIAGNOSIS — D469 Myelodysplastic syndrome, unspecified: Secondary | ICD-10-CM | POA: Diagnosis not present

## 2017-07-01 DIAGNOSIS — D696 Thrombocytopenia, unspecified: Secondary | ICD-10-CM | POA: Diagnosis not present

## 2017-07-01 DIAGNOSIS — D72819 Decreased white blood cell count, unspecified: Secondary | ICD-10-CM | POA: Diagnosis not present

## 2017-07-01 LAB — PREPARE RBC (CROSSMATCH)

## 2017-07-01 MED ORDER — ACETAMINOPHEN 325 MG PO TABS
ORAL_TABLET | ORAL | Status: AC
  Filled 2017-07-01: qty 2

## 2017-07-01 MED ORDER — DARBEPOETIN ALFA 500 MCG/ML IJ SOSY
PREFILLED_SYRINGE | INTRAMUSCULAR | Status: AC
  Filled 2017-07-01: qty 1

## 2017-07-01 MED ORDER — DIPHENHYDRAMINE HCL 25 MG PO CAPS
ORAL_CAPSULE | ORAL | Status: AC
  Filled 2017-07-01: qty 1

## 2017-07-01 MED ORDER — DARBEPOETIN ALFA 500 MCG/ML IJ SOSY
500.0000 ug | PREFILLED_SYRINGE | Freq: Once | INTRAMUSCULAR | Status: AC
  Administered 2017-07-01: 500 ug via SUBCUTANEOUS

## 2017-07-01 MED ORDER — SODIUM CHLORIDE 0.9 % IV SOLN
250.0000 mL | Freq: Once | INTRAVENOUS | Status: AC
  Administered 2017-07-01: 250 mL via INTRAVENOUS

## 2017-07-01 MED ORDER — DIPHENHYDRAMINE HCL 25 MG PO CAPS
25.0000 mg | ORAL_CAPSULE | Freq: Once | ORAL | Status: AC
  Administered 2017-07-01: 25 mg via ORAL

## 2017-07-01 MED ORDER — ACETAMINOPHEN 325 MG PO TABS
650.0000 mg | ORAL_TABLET | Freq: Once | ORAL | Status: AC
  Administered 2017-07-01: 650 mg via ORAL

## 2017-07-01 NOTE — Progress Notes (Signed)
HEMATOLOGY/ONCOLOGY CLINIC NOTE  Date of Service: 07/01/17  Patient Care Team: Marletta Lor, MD as PCP - General  CHIEF COMPLAINTS/PURPOSE OF CONSULTATION:  F/u for MDS/PMF  HISTORY OF PRESENTING ILLNESS:   Reginald Daniels is a wonderful 72 y.o. male who has been referred to [place] by Dr .Burnice Logan, Doretha Sou, MD for evaluation and management of macrocytic anemia.  Patient has a h/o mental retardation (has a legal guardia -[name] who was present for visit), prostate cancer (managed by urology, s/p brachy RT with seeds), chronic macrocytosis with anemia and leukopenia.  He recently on 12/28/2015 has labs with his PCP including CBC which showed severe anemia with hgb of 7.8 MCV 123, nl PLT 185k, mild leucopenia 3.4k.  He has had significant macrocytosis since atleast 2013 but possibly longer/lifelong. However previously had hgb of 12.1 in 02/2013 and 13.2 in 05/2011. He has had wbc count of 2.7-3.4k in this time period.  Patient is a limited historian. Notes no significant dizziness or lightheadedness. Some fatigue. No weight loss. Night sweats. No bone pains. Denies any other new urinary symptoms. He is not reporting any other focal symptoms. He is legal guardian does not suggest any other complaints.  CURRENT THERAPY: 300 mcg Aranesp every 2 weeks   INTERVAL HISTORY  Patient is here for management and evaluation of his macrocytic anemia and is accompanied by his legal guardian. The patient's last visit with [place] was on 03/22/17. The pt reports that he is doing well overall and enjoyed a recent trip to the beach.   The pt reports tat he has been keeping up with his biweekly shots of Aranesp. His guardian denies any concerns of bleeding, and notes that his appetite has been normal.   Lab results (06/28/17) of CBC and Reticulocytes is as follows: all values are WNL except for RBC at 1.89, Hgb at 6.7, HCT at 21.2, MCV at 112.2, MCH at 35.4, MCHC at 31.6, RDW at 25.6,  Platelets at 70k, Lymphs Abs at 0.6k, Monocytes abs at 2.3k, Retic ct pct at 4.4%.   On review of systems, pt reports some fatigue, and denies infections, abnormal bruising, bleeding, diarrhea, abdominal pains, light headedness, dizziness, and any other symptoms.    MEDICAL HISTORY:  Past Medical History:  Diagnosis Date  . Cancer [facility])    prostate  . Leukopenia   . Macrocytosis   . Mental retardation   . Thrombocytopenia ([place])     SURGICAL HISTORY: status post seed implantation for prostate cancer  bilateral inguinal herniorrhaphies   SOCIAL HISTORY: Social History   Socioeconomic History  . Marital status: Married    Spouse name: Not on file  . Number of children: Not on file  . Years of education: Not on file  . Highest education level: Not on file  Occupational History  . Not on file  Social Needs  . Financial resource strain: Not on file  . Food insecurity:    Worry: Not on file    Inability: Not on file  . Transportation needs:    Medical: Not on file    Non-medical: Not on file  Tobacco Use  . Smoking status: Never Smoker  . Smokeless tobacco: Never Used  Substance and Sexual Activity  . Alcohol use: No  . Drug use: No  . Sexual activity: Not on file  Lifestyle  . Physical activity:    Days per week: Not on file    Minutes per session: Not on file  .  Stress: Not on file  Relationships  . Social connections:    Talks on phone: Not on file    Gets together: Not on file    Attends religious service: Not on file    Active member of club or organization: Not on file    Attends meetings of clubs or organizations: Not on file    Relationship status: Not on file  . Intimate partner violence:    Fear of current or ex partner: Not on file    Emotionally abused: Not on file    Physically abused: Not on file    Forced sexual activity: Not on file  Other Topics Concern  . Not on file  Social History Narrative  . Not on file  Never Smoked  patient has  never been employed due to mental retardation  he lives with a court appointed guardian - [name] Smoking Status: never  FAMILY HISTORY:  father died at age 71  mother died young. A suicide death  No siblings   ALLERGIES:  has No Known Allergies.  MEDICATIONS:  Current Outpatient Medications  Medication Sig Dispense Refill  . b complex vitamins capsule Take 1 capsule by mouth daily.    . cetirizine (ZYRTEC) 10 MG tablet Take 10 mg by mouth daily.    . diphenhydrAMINE (BENADRYL ALLERGY) 25 mg capsule Take 50 mg by mouth every 6 (six) hours as needed.    . diphenoxylate-atropine (LOMOTIL) 2.5-0.025 MG tablet Take 1 tablet by mouth 4 (four) times daily as needed for diarrhea or loose stools. 30 tablet 0  . Multiple Vitamin (MULTIVITAMIN) tablet Take 1 tablet by mouth daily.       No current facility-administered medications for this visit.     REVIEW OF SYSTEMS:    A 10+ POINT REVIEW OF SYSTEMS WAS OBTAINED including neurology, dermatology, psychiatry, cardiac, respiratory, lymph, extremities, GI, GU, Musculoskeletal, constitutional, breasts, reproductive, HEENT.  All pertinent positives are noted in the HPI.  All others are negative.   PHYSICAL EXAMINATION:  ECOG PERFORMANCE STATUS: 1 - Symptomatic but completely ambulatory  . Vitals:   07/01/17 1155  BP: (!) 120/47  Pulse: 89  Resp: 17  Temp: 98.2 F (36.8 C)  SpO2: 100%   Filed Weights   07/01/17 1155  Weight: 126 lb (57.2 kg)   .Body mass index is 23.81 kg/m.  GENERAL:alert, in no acute distress and comfortable, appears pale SKIN: no acute rashes, no significant lesions EYES: conjunctiva with pallor sclera anicteric OROPHARYNX: MMM, no exudates, no oropharyngeal erythema or ulceration NECK: supple, no JVD LYMPH:  no palpable lymphadenopathy in the cervical, axillary or inguinal regions LUNGS: clear to auscultation b/l with normal respiratory effort HEART: regular rate & rhythm ABDOMEN:  normoactive bowel  sounds , non tender, not distended, no clinically palpable hepatosplenomegaly Extremity: 1+ pedal edema PSYCH: alert, significant cognitive issues due to h/o developmental disabilities NEURO: no focal motor/sensory deficits   LABORATORY DATA:  I have reviewed the data as listed  .[name] CBC Latest Ref Rng & Units 06/28/2017 06/14/2017 05/31/2017  WBC 4.0 - 10.3 K/uL 6.3 13.4(H) 8.3  Hemoglobin 13.0 - 17.1 g/dL 6.7(LL) 6.1(LL) 7.2(L)  Hematocrit 38.4 - 49.9 % 21.2(L) 20.0(L) 22.6(L)  Platelets 140 - 400 K/uL 70(L) 56(L) 54(L)   . CBC    Component Value Date/Time   WBC 6.3 06/28/2017 0815   WBC 8.3 05/31/2017 0824   RBC 1.89 (L) 06/28/2017 0815   RBC 1.89 (L) 06/28/2017 0815   HGB 6.7 (LL)  06/28/2017 0815   HGB 7.6 (L) 02/22/2017 0812   HCT 21.2 (L) 06/28/2017 0815   HCT 23.7 (L) 02/22/2017 0812   PLT 70 (L) 06/28/2017 0815   PLT 50 (L) 02/22/2017 0812   MCV 112.2 (H) 06/28/2017 0815   MCV 121.5 (H) 02/22/2017 0812   MCH 35.4 (H) 06/28/2017 0815   MCHC 31.6 (L) 06/28/2017 0815   RDW 25.6 (H) 06/28/2017 0815   RDW 24.5 (H) 02/22/2017 0812   LYMPHSABS 0.6 (L) 06/28/2017 0815   LYMPHSABS 0.4 (L) 02/08/2017 0805   MONOABS 2.3 (H) 06/28/2017 0815   MONOABS 2.2 (H) 02/08/2017 0805   EOSABS 0.1 06/28/2017 0815   EOSABS 0.0 02/08/2017 0805   BASOSABS 0.1 06/28/2017 0815   BASOSABS 0.1 02/08/2017 0805      . CMP Latest Ref Rng & Units 03/08/2017 02/08/2017 01/23/2017  Glucose 70 - 140 mg/dL 84 87 84  BUN 7 - 26 mg/dL 14 16.2 19.4  Creatinine 0.70 - 1.30 mg/dL 1.29 1.3 1.2  Sodium 136 - 145 mmol/L 140 142 138  Potassium 3.5 - 5.1 mmol/L 4.2 4.2 4.2  Chloride 98 - 109 mmol/L 111(H) - -  CO2 22 - 29 mmol/L 21(L) 21(L) 22  Calcium 8.4 - 10.4 mg/dL 8.8 9.7 9.7  Total Protein 6.4 - 8.3 g/dL 6.6 6.8 7.1  Total Bilirubin 0.2 - 1.2 mg/dL 1.2 1.14 1.33(H)  Alkaline Phos 40 - 150 U/L 62 61 65  AST 5 - 34 U/L _0 ALT 0 - 55 U/L _1 Component     Latest Ref Rng & Units  10/10/2016  Erythropoietin     2.6 - 18.5 mIU/mL 108.1 (H)             RADIOGRAPHIC STUDIES: I have personally reviewed the radiological images as listed and agreed with the findings in the report. No results found.  ASSESSMENT & PLAN:   72 year old very pleasant gentleman with mental retardation here with his legal guardian for evaluation of   1) Severe Macrocytic Anemia  Hemoglobin of 7.8 on 12/28/2015. Hemoglobin of 7.7 as of 10/26/16.  Previously, his Hgb appeared to be improving to 8.4 then to 8.9 and now up to 9.6 with improving MCV and RDW while on multivitamins and B complex. -Hgb at 10, MCV at 103.4 and RDW at 25.2 as of 11/30/2016 (s/p PRBC transfusion) -Hgb at 7.9, MCV at 34.3, and RDW at 27.6 as of 12/21/2016 -Hgb at 8.3 , MCV at 122.2, and RDW at 25.0 as of 03/22/17. -If ferritin level is less than 100 we will need to supplement iron. Ferritin labs pending 03/22/17 -BP today 03/22/17 is good, indicating pt continues to tolerate aranesp well  Multiple myeloma panel and serum free light chains within normal limits TSH within normal limits LDH and haptoglobin suggest no evidence of hemolysis B12 and calcium levels within normal limits. 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 Lab Results  Component Value Date   FERRITIN 250 03/22/2017    -Ferritin at 446 as of 11/30/2016  EPO levels 108  2) Leukopenia - WBC count .  WBC 2.9k, ANC 2k as of 10/10/16 WBC 3.2k as of 10/26/16. ANC 1.8k ANC today 1.6k (11/16/2016) WBC 2.8k as of 11/30/2016 WBC at 4.5k as of 12/21/2016 WBC at 6.0k as of 03/22/17 WBC at 6.3k as of  06/28/17  3) Thrombocytopenia  -PLT at 87 as of 10/26/2016. PLT at 55k on 11/15/2016 -PLT at 42k as of 11/30/2016  -PLT at 62 as of 12/21/2016  -PLT at 42k as of 03/22/17 PLT at70k as of 06/28/17 No evidence of abnormal bruising or  bleeding.  PLAN -increased Aranesp to564mg q2weeks for now to see if this might reduce his need for PRBC transfusions. -transfuse prbc for hgb<7 or if symptomatic -Will have the patient follow up for Aranesp injection q2weeks -Will monitor labs every 2 weeks -Patient and his guardian were recommended to call [place] they note increasing fatigue, dizziness, or DOE. -Discussed pt labwork from 06/28/17; Hgb at 6.7 -Will order 1 unit of PRBCs today  -Aranesp shots will continue to be supported by transfusions as necessary    PRBC transfusion and Aranesp today Continue Aranesp q2weeks with labs (please schedule for 3 months) RTC with Dr KIrene Limboin 2 months   All of the patients questions were answered with apparent satisfaction. The patient knows to call the clinic with any problems, questions or concerns.  The toal time spent in the appt was 20 minutes and more than 50% was on counseling and direct patient cares.    GSullivan LoneMD MMaricopaAAHIVMS SS[facility] SewaneeCSenate Street [facility] Iu HealthHematology/Oncology Physician [place] (Office):       [phone](Work cell):  [phone](Fax):           [phone] This document serves as a record of services personally performed by GSullivan Lone MD. It was created on his behalf by SBaldwin Jamaica a trained medical scribe. The creation of this record is based on the scribe's personal observations and the provider's statements to them.   .I have reviewed the above documentation for accuracy and completeness, and I agree with the above. .Brunetta GeneraMD MS

## 2017-07-01 NOTE — Patient Instructions (Signed)

## 2017-07-01 NOTE — Telephone Encounter (Signed)
Appointments scheduled per 5/13 los. Spoke with patient in infusion area and per patient he will lose schedule please give to relative with him in lobby. Avs report and appointments given to relative accompanying patient.

## 2017-07-02 LAB — TYPE AND SCREEN
ABO/RH(D): O NEG
Antibody Screen: NEGATIVE
UNIT DIVISION: 0

## 2017-07-02 LAB — BPAM RBC
BLOOD PRODUCT EXPIRATION DATE: 201905292359
ISSUE DATE / TIME: 201905131559
UNIT TYPE AND RH: 9500

## 2017-07-16 ENCOUNTER — Inpatient Hospital Stay: Payer: Medicare Other

## 2017-07-16 VITALS — BP 122/52 | HR 77 | Temp 98.1°F | Resp 17

## 2017-07-16 DIAGNOSIS — D469 Myelodysplastic syndrome, unspecified: Secondary | ICD-10-CM

## 2017-07-16 DIAGNOSIS — D696 Thrombocytopenia, unspecified: Secondary | ICD-10-CM | POA: Diagnosis not present

## 2017-07-16 DIAGNOSIS — D539 Nutritional anemia, unspecified: Secondary | ICD-10-CM | POA: Diagnosis not present

## 2017-07-16 DIAGNOSIS — D72819 Decreased white blood cell count, unspecified: Secondary | ICD-10-CM | POA: Diagnosis not present

## 2017-07-16 LAB — CBC WITH DIFFERENTIAL/PLATELET
Basophils Absolute: 0.1 10*3/uL (ref 0.0–0.1)
Basophils Relative: 2 %
Eosinophils Absolute: 0 10*3/uL (ref 0.0–0.5)
Eosinophils Relative: 0 %
HEMATOCRIT: 23.9 % — AB (ref 38.4–49.9)
Hemoglobin: 7.6 g/dL — ABNORMAL LOW (ref 13.0–17.1)
LYMPHS PCT: 8 %
Lymphs Abs: 0.6 10*3/uL — ABNORMAL LOW (ref 0.9–3.3)
MCH: 35.2 pg — AB (ref 27.2–33.4)
MCHC: 31.8 g/dL — AB (ref 32.0–36.0)
MCV: 110.6 fL — ABNORMAL HIGH (ref 79.3–98.0)
MONO ABS: 4.7 10*3/uL — AB (ref 0.1–0.9)
MONOS PCT: 66 %
Neutro Abs: 1.7 10*3/uL (ref 1.5–6.5)
Neutrophils Relative %: 24 %
Platelets: 45 10*3/uL — ABNORMAL LOW (ref 140–400)
RBC: 2.16 MIL/uL — ABNORMAL LOW (ref 4.20–5.82)
RDW: 28.1 % — ABNORMAL HIGH (ref 11.0–14.6)
WBC: 7.2 10*3/uL (ref 4.0–10.3)

## 2017-07-16 LAB — CMP (CANCER CENTER ONLY)
ALBUMIN: 3.3 g/dL — AB (ref 3.5–5.0)
ALK PHOS: 59 U/L (ref 40–150)
ALT: 12 U/L (ref 0–55)
AST: 17 U/L (ref 5–34)
Anion gap: 8 (ref 3–11)
BUN: 20 mg/dL (ref 7–26)
CHLORIDE: 110 mmol/L — AB (ref 98–109)
CO2: 21 mmol/L — AB (ref 22–29)
Calcium: 9.8 mg/dL (ref 8.4–10.4)
Creatinine: 1.49 mg/dL — ABNORMAL HIGH (ref 0.70–1.30)
GFR, Est AFR Am: 52 mL/min — ABNORMAL LOW (ref >60)
GFR, Estimated: 45 mL/min — ABNORMAL LOW (ref >60)
GLUCOSE: 83 mg/dL (ref 70–140)
POTASSIUM: 4.2 mmol/L (ref 3.5–5.1)
SODIUM: 139 mmol/L (ref 136–145)
Total Bilirubin: 0.7 mg/dL (ref 0.2–1.2)
Total Protein: 7.2 g/dL (ref 6.4–8.3)

## 2017-07-16 MED ORDER — DARBEPOETIN ALFA 500 MCG/ML IJ SOSY
PREFILLED_SYRINGE | INTRAMUSCULAR | Status: AC
  Filled 2017-07-16: qty 1

## 2017-07-16 MED ORDER — DARBEPOETIN ALFA 500 MCG/ML IJ SOSY
500.0000 ug | PREFILLED_SYRINGE | Freq: Once | INTRAMUSCULAR | Status: AC
  Administered 2017-07-16: 500 ug via SUBCUTANEOUS

## 2017-07-16 NOTE — Progress Notes (Signed)
Pts Hgb is 7.6 today, vital signs are stable, pt says he feels great. Gave copy of labs to family and instructed to call if any problems occur and we can make an appointment for blood.

## 2017-07-16 NOTE — Patient Instructions (Signed)

## 2017-07-17 DIAGNOSIS — N3041 Irradiation cystitis with hematuria: Secondary | ICD-10-CM | POA: Diagnosis not present

## 2017-07-29 ENCOUNTER — Inpatient Hospital Stay: Payer: Medicare Other | Attending: Hematology

## 2017-07-29 ENCOUNTER — Inpatient Hospital Stay: Payer: Medicare Other

## 2017-07-29 VITALS — BP 119/64 | HR 77 | Temp 98.7°F | Resp 18

## 2017-07-29 DIAGNOSIS — D469 Myelodysplastic syndrome, unspecified: Secondary | ICD-10-CM | POA: Diagnosis not present

## 2017-07-29 LAB — CBC WITH DIFFERENTIAL/PLATELET
BASOS PCT: 2 %
Basophils Absolute: 0.2 10*3/uL — ABNORMAL HIGH (ref 0.0–0.1)
EOS ABS: 0 10*3/uL (ref 0.0–0.5)
EOS PCT: 0 %
HCT: 23 % — ABNORMAL LOW (ref 38.4–49.9)
HEMOGLOBIN: 7.1 g/dL — AB (ref 13.0–17.1)
Lymphocytes Relative: 18 %
Lymphs Abs: 1.8 10*3/uL (ref 0.9–3.3)
MCH: 35.5 pg — AB (ref 27.2–33.4)
MCHC: 30.9 g/dL — AB (ref 32.0–36.0)
MCV: 115 fL — ABNORMAL HIGH (ref 79.3–98.0)
MONO ABS: 5.4 10*3/uL — AB (ref 0.1–0.9)
MONOS PCT: 57 %
NEUTROS ABS: 2.3 10*3/uL (ref 1.5–6.5)
Neutrophils Relative %: 23 %
PLATELETS: 37 10*3/uL — AB (ref 140–400)
RBC: 2 MIL/uL — AB (ref 4.20–5.82)
RDW: 29.7 % — AB (ref 11.0–14.6)
Smear Review: 12
WBC: 9.7 10*3/uL (ref 4.0–10.3)
nRBC: 9 /100 WBC — ABNORMAL HIGH

## 2017-07-29 MED ORDER — DARBEPOETIN ALFA 500 MCG/ML IJ SOSY
500.0000 ug | PREFILLED_SYRINGE | Freq: Once | INTRAMUSCULAR | Status: AC
  Administered 2017-07-29: 500 ug via SUBCUTANEOUS

## 2017-07-29 MED ORDER — DARBEPOETIN ALFA 500 MCG/ML IJ SOSY
PREFILLED_SYRINGE | INTRAMUSCULAR | Status: AC
  Filled 2017-07-29: qty 1

## 2017-07-29 NOTE — Progress Notes (Signed)
Pt hgb 7.1 today vital signs stable pt says he feels fine. Gave injection and copy of labs to family. Instructed to call if he starts feeling like he needs blood

## 2017-07-29 NOTE — Patient Instructions (Signed)

## 2017-08-12 ENCOUNTER — Telehealth: Payer: Self-pay

## 2017-08-12 ENCOUNTER — Inpatient Hospital Stay: Payer: Medicare Other

## 2017-08-12 VITALS — BP 122/64 | HR 72 | Temp 98.6°F | Resp 17

## 2017-08-12 DIAGNOSIS — D469 Myelodysplastic syndrome, unspecified: Secondary | ICD-10-CM

## 2017-08-12 LAB — CBC WITH DIFFERENTIAL/PLATELET
Basophils Absolute: 0.3 10*3/uL — ABNORMAL HIGH (ref 0.0–0.1)
Basophils Relative: 3 %
EOS ABS: 0 10*3/uL (ref 0.0–0.5)
Eosinophils Relative: 0 %
HEMATOCRIT: 22.1 % — AB (ref 38.4–49.9)
HEMOGLOBIN: 7 g/dL — AB (ref 13.0–17.1)
LYMPHS ABS: 0.9 10*3/uL (ref 0.9–3.3)
Lymphocytes Relative: 7 %
MCH: 37.6 pg — ABNORMAL HIGH (ref 27.2–33.4)
MCHC: 31.7 g/dL — AB (ref 32.0–36.0)
MCV: 118.8 fL — ABNORMAL HIGH (ref 79.3–98.0)
MONOS PCT: 68 %
Monocytes Absolute: 8.3 10*3/uL — ABNORMAL HIGH (ref 0.1–0.9)
NEUTROS ABS: 2.7 10*3/uL (ref 1.5–6.5)
NEUTROS PCT: 22 %
Platelets: 48 10*3/uL — ABNORMAL LOW (ref 140–400)
RBC: 1.86 MIL/uL — ABNORMAL LOW (ref 4.20–5.82)
RDW: 28.6 % — ABNORMAL HIGH (ref 11.0–14.6)
Smear Review: 13
WBC: 12.2 10*3/uL — ABNORMAL HIGH (ref 4.0–10.3)

## 2017-08-12 MED ORDER — DARBEPOETIN ALFA 500 MCG/ML IJ SOSY
PREFILLED_SYRINGE | INTRAMUSCULAR | Status: AC
  Filled 2017-08-12: qty 1

## 2017-08-12 MED ORDER — DARBEPOETIN ALFA 500 MCG/ML IJ SOSY
500.0000 ug | PREFILLED_SYRINGE | Freq: Once | INTRAMUSCULAR | Status: AC
  Administered 2017-08-12: 500 ug via SUBCUTANEOUS

## 2017-08-12 NOTE — Progress Notes (Signed)
Pt hgb 7.0 today vital signs stable pt says he feels fine. Gave injection and copy of labs to family. Instructed to call if he starts feeling like he needs blood

## 2017-08-12 NOTE — Patient Instructions (Signed)

## 2017-08-12 NOTE — Telephone Encounter (Signed)
Panic lab value for hemoglobin of 7.0 received from the lab. Dr. Irene Limbo made aware. No new orders received.

## 2017-08-21 ENCOUNTER — Encounter (HOSPITAL_COMMUNITY): Payer: Self-pay

## 2017-08-21 ENCOUNTER — Other Ambulatory Visit: Payer: Self-pay

## 2017-08-21 ENCOUNTER — Inpatient Hospital Stay (HOSPITAL_COMMUNITY): Payer: Medicare Other

## 2017-08-21 ENCOUNTER — Other Ambulatory Visit (HOSPITAL_COMMUNITY): Payer: Self-pay

## 2017-08-21 ENCOUNTER — Inpatient Hospital Stay (HOSPITAL_COMMUNITY)
Admission: EM | Admit: 2017-08-21 | Discharge: 2017-08-24 | DRG: 083 | Disposition: A | Discharge status: Home Health | Payer: Medicare Other | Attending: Pulmonary Disease | Admitting: Pulmonary Disease

## 2017-08-21 ENCOUNTER — Emergency Department (HOSPITAL_COMMUNITY): Payer: Medicare Other

## 2017-08-21 DIAGNOSIS — N179 Acute kidney failure, unspecified: Secondary | ICD-10-CM | POA: Diagnosis not present

## 2017-08-21 DIAGNOSIS — D696 Thrombocytopenia, unspecified: Secondary | ICD-10-CM | POA: Diagnosis present

## 2017-08-21 DIAGNOSIS — I615 Nontraumatic intracerebral hemorrhage, intraventricular: Secondary | ICD-10-CM

## 2017-08-21 DIAGNOSIS — D469 Myelodysplastic syndrome, unspecified: Secondary | ICD-10-CM | POA: Diagnosis present

## 2017-08-21 DIAGNOSIS — D649 Anemia, unspecified: Secondary | ICD-10-CM | POA: Diagnosis not present

## 2017-08-21 DIAGNOSIS — D72829 Elevated white blood cell count, unspecified: Secondary | ICD-10-CM | POA: Diagnosis present

## 2017-08-21 DIAGNOSIS — S065X0A Traumatic subdural hemorrhage without loss of consciousness, initial encounter: Secondary | ICD-10-CM

## 2017-08-21 DIAGNOSIS — S065X9A Traumatic subdural hemorrhage with loss of consciousness of unspecified duration, initial encounter: Principal | ICD-10-CM | POA: Diagnosis present

## 2017-08-21 DIAGNOSIS — W19XXXA Unspecified fall, initial encounter: Secondary | ICD-10-CM

## 2017-08-21 DIAGNOSIS — D539 Nutritional anemia, unspecified: Secondary | ICD-10-CM | POA: Diagnosis present

## 2017-08-21 DIAGNOSIS — S065XAA Traumatic subdural hemorrhage with loss of consciousness status unknown, initial encounter: Secondary | ICD-10-CM

## 2017-08-21 DIAGNOSIS — S299XXA Unspecified injury of thorax, initial encounter: Secondary | ICD-10-CM | POA: Diagnosis not present

## 2017-08-21 DIAGNOSIS — R799 Abnormal finding of blood chemistry, unspecified: Secondary | ICD-10-CM | POA: Diagnosis not present

## 2017-08-21 DIAGNOSIS — Z8546 Personal history of malignant neoplasm of prostate: Secondary | ICD-10-CM | POA: Diagnosis not present

## 2017-08-21 DIAGNOSIS — F79 Unspecified intellectual disabilities: Secondary | ICD-10-CM | POA: Diagnosis present

## 2017-08-21 DIAGNOSIS — Z79899 Other long term (current) drug therapy: Secondary | ICD-10-CM | POA: Diagnosis not present

## 2017-08-21 DIAGNOSIS — R296 Repeated falls: Secondary | ICD-10-CM | POA: Diagnosis present

## 2017-08-21 DIAGNOSIS — W1830XA Fall on same level, unspecified, initial encounter: Secondary | ICD-10-CM | POA: Diagnosis present

## 2017-08-21 DIAGNOSIS — S199XXA Unspecified injury of neck, initial encounter: Secondary | ICD-10-CM | POA: Diagnosis not present

## 2017-08-21 DIAGNOSIS — Q761 Klippel-Feil syndrome: Secondary | ICD-10-CM

## 2017-08-21 LAB — CBC WITH DIFFERENTIAL/PLATELET
BASOS ABS: 0.2 10*3/uL — AB (ref 0.0–0.1)
Basophils Relative: 1 %
EOS ABS: 0 10*3/uL (ref 0.0–0.7)
Eosinophils Relative: 0 %
HCT: 19.2 % — ABNORMAL LOW (ref 39.0–52.0)
Hemoglobin: 5.8 g/dL — CL (ref 13.0–17.0)
LYMPHS ABS: 1.7 10*3/uL (ref 0.7–4.0)
LYMPHS PCT: 9 %
MCH: 39.2 pg — ABNORMAL HIGH (ref 26.0–34.0)
MCHC: 30.2 g/dL (ref 30.0–36.0)
MCV: 129.7 fL — ABNORMAL HIGH (ref 78.0–100.0)
Monocytes Absolute: 10.4 10*3/uL — ABNORMAL HIGH (ref 0.1–1.0)
Monocytes Relative: 56 %
Neutro Abs: 6.4 10*3/uL (ref 1.7–7.7)
Neutrophils Relative %: 34 %
PLATELETS: 48 10*3/uL — AB (ref 150–400)
RBC: 1.48 MIL/uL — AB (ref 4.22–5.81)
RDW: 27.2 % — ABNORMAL HIGH (ref 11.5–15.5)
WBC: 18.7 10*3/uL — AB (ref 4.0–10.5)

## 2017-08-21 LAB — COMPREHENSIVE METABOLIC PANEL
ALBUMIN: 2.9 g/dL — AB (ref 3.5–5.0)
ALK PHOS: 48 U/L (ref 38–126)
ALT: 22 U/L (ref 0–44)
AST: 32 U/L (ref 15–41)
Anion gap: 4 — ABNORMAL LOW (ref 5–15)
BUN: 15 mg/dL (ref 8–23)
CO2: 20 mmol/L — ABNORMAL LOW (ref 22–32)
Calcium: 8.2 mg/dL — ABNORMAL LOW (ref 8.9–10.3)
Chloride: 115 mmol/L — ABNORMAL HIGH (ref 98–111)
Creatinine, Ser: 1.57 mg/dL — ABNORMAL HIGH (ref 0.61–1.24)
GFR calc non Af Amer: 42 mL/min — ABNORMAL LOW (ref >60)
GFR, EST AFRICAN AMERICAN: 49 mL/min — AB (ref >60)
Glucose, Bld: 98 mg/dL (ref 70–99)
Potassium: 3.8 mmol/L (ref 3.5–5.1)
SODIUM: 139 mmol/L (ref 135–145)
TOTAL PROTEIN: 6.1 g/dL — AB (ref 6.5–8.1)
Total Bilirubin: 1.2 mg/dL (ref 0.3–1.2)

## 2017-08-21 LAB — PROTIME-INR
INR: 1.33
PROTHROMBIN TIME: 16.4 s — AB (ref 11.4–15.2)

## 2017-08-21 LAB — ABO/RH: ABO/RH(D): O NEG

## 2017-08-21 LAB — PREPARE RBC (CROSSMATCH)

## 2017-08-21 MED ORDER — SODIUM CHLORIDE 0.9% IV SOLUTION
Freq: Once | INTRAVENOUS | Status: AC
  Administered 2017-08-22: 02:00:00 via INTRAVENOUS

## 2017-08-21 MED ORDER — PANTOPRAZOLE SODIUM 40 MG PO TBEC
40.0000 mg | DELAYED_RELEASE_TABLET | Freq: Every day | ORAL | Status: DC
  Administered 2017-08-22 – 2017-08-24 (×3): 40 mg via ORAL
  Filled 2017-08-21 (×3): qty 1

## 2017-08-21 NOTE — ED Triage Notes (Signed)
Pt states that he fell in his room today hitting his head on his dresser. Pt has small laceration between his eyes. Denies recent falls but has noticeable bruising on knees and right forehead and chin. Questionable different stage of healing.

## 2017-08-21 NOTE — Consult Note (Signed)
Reason for Consult:SDH Referring Physician: EDP  Reginald Daniels is an 72 y.o. male.   HPI:  72 year old gentleman with a history of leukopenia and thrombocytopenia and MR who has had multiple falls, who fell about 5:30 today and struck his head on a piece of furniture. No loss of consciousness. He had a small laceration between his eyes. He is brought to the emergency department to see if he needed suturing. CT scan of the head showed a right subdural hematoma, platelet count 48,000, hemoglobin 5.3 and neurosurgical evaluation was requested. The patient denies headache. He denies numbness tingling weakness or visual changes. Family member states he walked to the car with his walker after the fall.  Past Medical History:  Diagnosis Date  . Cancer [facility])    prostate  . Leukopenia   . Macrocytosis   . Mental retardation   . Thrombocytopenia ([place])     History reviewed. No pertinent surgical history.  No Known Allergies  Social History   Tobacco Use  . Smoking status: Never Smoker  . Smokeless tobacco: Never Used  Substance Use Topics  . Alcohol use: No    History reviewed. No pertinent family history.   Review of Systems  Positive ROS: Negative, he does proceed with erythropoietin shots for anemia  All other systems have been reviewed and were otherwise negative with the exception of those mentioned in the HPI and as above.  Objective: Vital signs in last 24 hours: Temp:  [98.7 F (37.1 C)] 98.7 F (37.1 C) (07/03 1815) Pulse Rate:  [78-88] 79 (07/03 1915) Resp:  [14-18] 14 (07/03 1915) BP: (108-116)/(51-54) 110/51 (07/03 1915) SpO2:  [99 %-100 %] 99 % (07/03 1915) Weight:  [58.1 kg (128 lb)] 58.1 kg (128 lb) (07/03 1818)  General Appearance: Alert, cooperative, no distress, appears stated age Head: Normocephalic, without obvious abnormality, multiple bruises on the forehead and chin differing ages Eyes: PERRL, conjunctiva/corneas clear, EOM'Daniels intact    Neck: Mildly  tender Back: Symmetric, no curvature, ROM normal, no CVA tenderness Lungs:  respirations unlabored Heart: Regular rate and rhythm Abdomen: Soft  NEUROLOGIC:   Mental status: A&O x4, no aphasia, good attention span, Memory and fund of knowledge appear to be baseline Motor Exam - grossly normal though there may be some decreased movement of the right shoulder girdle, normal tone and bulk Sensory Exam - grossly normal Reflexes: symmetric, no pathologic reflexes, No Hoffman'Daniels, No clonus Coordination - decreased coordination of upper extremities Gait - not tested Balance - not tested Cranial Nerves: I: smell Not tested  II: visual acuity  OS: na    OD: na  II: visual fields Full to confrontation  II: pupils Equal, round, reactive to light  III,VII: ptosis None  III,IV,VI: extraocular muscles  Full ROM  V: mastication Normal  V: facial light touch sensation  Normal  V,VII: corneal reflex  Present  VII: facial muscle function - upper  Normal  VII: facial muscle function - lower Normal  VIII: hearing Not tested  IX: soft palate elevation  Normal  IX,X: gag reflex Present  XI: trapezius strength  5/5  XI: sternocleidomastoid strength 5/5  XI: neck flexion strength  5/5  XII: tongue strength  Normal    Data Review Lab Results  Component Value Date   WBC 18.7 (H) 08/21/2017   HGB 5.8 (LL) 08/21/2017   HCT 19.2 (L) 08/21/2017   MCV 129.7 (H) 08/21/2017   PLT 48 (L) 08/21/2017   Lab Results  Component Value Date  NA 139 08/21/2017   K 3.8 08/21/2017   CL 115 (H) 08/21/2017   CO2 20 (L) 08/21/2017   BUN 15 08/21/2017   CREATININE 1.57 (H) 08/21/2017   GLUCOSE 98 08/21/2017   Lab Results  Component Value Date   INR 1.33 08/21/2017    Radiology: Ct Head Wo Contrast  Result Date: 08/21/2017 CLINICAL DATA:  Fall with laceration to the head EXAM: CT HEAD WITHOUT CONTRAST CT CERVICAL SPINE WITHOUT CONTRAST TECHNIQUE: Multidetector CT imaging of the head and cervical spine  was performed following the standard protocol without intravenous contrast. Multiplanar CT image reconstructions of the cervical spine were also generated. COMPARISON:  None. FINDINGS: CT HEAD FINDINGS Brain: No acute territorial infarction or intracranial mass is visualized. Acute right subdural hematoma, measuring 18 mm maximum thickness in the right posterior temporal region. Convex margin of the hematoma along the right temporal convexity. Small amount of posterior inter hemispheric hemorrhage on the right. No significant midline shift. Localized mass effect on the right lateral ventricle. Small hematocrit level in the left lateral ventricle. Atrophy and small vessel ischemic changes of the white matter. The ventricular system is diffusely enlarged. Vascular: No hyperdense vessels.  Carotid vascular calcification. Skull: No fracture seen. Sinuses/Orbits: Mucosal thickening in the ethmoid sinuses. Other: None CT CERVICAL SPINE FINDINGS Alignment: Trace retrolisthesis C6 on C7. Facet alignment within normal limits. Skull base and vertebrae: Flattening of the occipital condyles without dislocation. No fracture Soft tissues and spinal canal: No prevertebral fluid or swelling. No visible canal hematoma. Disc levels: Fusion of C2-C3 and C4. Partial fusion of C5-C6. Marked degenerative change at C6-C7. Incomplete fusion anterior arch of C1 Upper chest: Lung apices are clear.  No thyroid mass Other: None IMPRESSION: 1. Acute right hemispheric extra-axial hematoma measuring up to 18 mm in thickness. Although there is some convex margin of the blood collection at the posterior temporal lobe, suspect that hematoma is subdural. Small amount of posterior interhemispheric and right tentorial subdural hematoma. Small amount of left intraventricular hemorrhage. Localized mass effect on the right lateral ventricle but without significant midline shift. 2. Ventriculomegaly 3. 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 No acute fracture is seen. Critical Value/emergent results were called by telephone at the time of interpretation on 08/21/2017 at 8:12 pm to Dr. Margarita Mail , who verbally acknowledged these results. Electronically Signed   By: Donavan Foil M.D.   On: 08/21/2017 20:12   Ct Cervical Spine Wo Contrast  Result Date: 08/21/2017 CLINICAL DATA:  Fall with laceration to the head EXAM: CT HEAD WITHOUT CONTRAST CT CERVICAL SPINE WITHOUT CONTRAST TECHNIQUE: Multidetector CT imaging of the head and cervical spine was performed following the standard protocol without intravenous contrast. Multiplanar CT image reconstructions of the cervical spine were also generated. COMPARISON:  None. FINDINGS: CT HEAD FINDINGS Brain: No acute territorial infarction or intracranial mass is visualized. Acute right subdural hematoma, measuring 18 mm maximum thickness in the right posterior temporal region. Convex margin of the hematoma along the right temporal convexity. Small amount of posterior inter hemispheric hemorrhage on the right. No significant midline shift. Localized mass effect on the right lateral ventricle. Small hematocrit level in the left lateral ventricle. Atrophy and small vessel ischemic changes of the white matter. The ventricular system is diffusely enlarged. Vascular: No hyperdense vessels.  Carotid vascular calcification. Skull: No fracture seen. Sinuses/Orbits: Mucosal thickening in the ethmoid sinuses. Other: None CT CERVICAL SPINE FINDINGS Alignment: Trace retrolisthesis C6  on C7. Facet alignment within normal limits. Skull base and vertebrae: Flattening of the occipital condyles without dislocation. No fracture Soft tissues and spinal canal: No prevertebral fluid or swelling. No visible canal hematoma. Disc levels: Fusion of C2-C3 and C4. Partial fusion of C5-C6. Marked degenerative change at C6-C7. Incomplete fusion anterior arch of C1 Upper chest: Lung apices are clear.  No thyroid  mass Other: None IMPRESSION: 1. Acute right hemispheric extra-axial hematoma measuring up to 18 mm in thickness. Although there is some convex margin of the blood collection at the posterior temporal lobe, suspect that hematoma is subdural. Small amount of posterior interhemispheric and right tentorial subdural hematoma. Small amount of left intraventricular hemorrhage. Localized mass effect on the right lateral ventricle but without significant midline shift. 2. Ventriculomegaly 3. 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 No acute fracture is seen. Critical Value/emergent results were called by telephone at the time of interpretation on 08/21/2017 at 8:12 pm to Dr. Margarita Mail , who verbally acknowledged these results. Electronically Signed   By: Donavan Foil M.D.   On: 08/21/2017 20:12     Assessment/Plan: Estimated body mass index is 25 kg/m as calculated from the following:   Height as of this encounter: 5' (1.524 m).   Weight as of this encounter: 58.1 kg (128 lb).   72 year old gentleman with an acute right subdural hematoma, platelet count of 48,000 hemoglobin of 5.3 down from 9 days ago. Right now he appears to be neurologically intact and awake and alert not even complaining of headache. Obviously with a platelet count of 48,000 there is a chance this hematoma could grow and become symptomatic and need evacuation, but at this point I would simply observe this. I believe he should be admitted to the CCM service to manage his medical issues with me following along in consultation. Repeat CT scan in the morning or if there is a change in neurologic status. Admit to the ICU.[name]   Reginald Daniels,Reginald Daniels 08/21/2017 10:16 PM

## 2017-08-21 NOTE — ED Provider Notes (Signed)
[facility] EMERGENCY DEPARTMENT Provider Note   CSN: [id number] Arrival date & time: 08/21/17  1809     History   Chief Complaint Chief Complaint  Patient presents with  . Fall    HPI Reginald Daniels is a 72 y.o. male with a past medical history of MR, cancer, thrombocytopenia, myelodysplastic disorder who presents the emergency department after fall.  He is attended by his family member.  Had a mechanical fall today in his room and hit his forehead against the corner of a light table.  The patient did not lose consciousness.  The bleeding has resolved.  His family member states he is unsure of his last tetanus vaccination.  The patient is covered in bruises but they both deny frequent falls at home.  He denies melena or hematochezia.  He has no other injuries.  HPI  Past Medical History:  Diagnosis Date  . Cancer [facility])    prostate  . Leukopenia   . Macrocytosis   . Mental retardation   . Thrombocytopenia [facility])     Patient Active Problem List   Diagnosis Date Noted  . MDS (myelodysplastic syndrome) ([place]) 10/26/2016  . MACROCYTIC ANEMIA 02/22/2010  . Leukopenia 02/22/2010  . 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 MENTAL RETARDATION 11/22/2009    History reviewed. No pertinent surgical history.      Home Medications    Prior to Admission medications   Medication Sig Start Date End Date Taking? Authorizing Provider  b complex vitamins capsule Take 1 capsule by mouth daily. 09/30/16   Brunetta Genera, MD  cetirizine (ZYRTEC) 10 MG tablet Take 10 mg by mouth daily.    [provider]  diphenhydrAMINE (BENADRYL ALLERGY) 25 mg capsule Take 50 mg by mouth every 6 (six) hours as needed.    [provider]  diphenoxylate-atropine (LOMOTIL) 2.5-0.025 MG tablet Take 1 tablet by mouth 4 (four) times daily as needed for diarrhea or loose stools. 11/14/16   Marletta Lor, MD  Multiple Vitamin (MULTIVITAMIN) tablet Take 1 tablet by  mouth daily.      [provider]    Family History No family history on file.  Social History Social History   Tobacco Use  . Smoking status: Never Smoker  . Smokeless tobacco: Never Used  Substance Use Topics  . Alcohol use: No  . Drug use: No     Allergies   Patient has no known allergies.   Review of Systems Review of Systems Ten systems reviewed and are negative for acute change, except as noted in the HPI.    Physical Exam Updated Vital Signs BP (!) 116/54 (BP Location: Right Arm)   Pulse 88   Temp 98.7 F (37.1 C) (Oral)   Resp 18   Ht 5' (1.524 m)   Wt 58.1 kg (128 lb)   SpO2 100%   BMI 25.00 kg/m   Physical Exam  Constitutional: He is oriented to person, place, and time. He appears well-developed and well-nourished. No distress.  HENT:  Head: Normocephalic.  3 cm laceration between the eyebrows.  No active bleeding  Eyes: Pupils are equal, round, and reactive to light. Conjunctivae and EOM are normal. No scleral icterus.  Neck: Normal range of motion. Neck supple.  Cardiovascular: Normal rate, regular rhythm and normal heart sounds.  Pulmonary/Chest: Effort normal and breath sounds normal. No respiratory distress.  Abdominal: Soft. There is no tenderness.  Musculoskeletal: He exhibits no edema.  Neurological: He is alert and oriented  to person, place, and time.  Speech is clear and goal oriented, follows commands Major Cranial nerves without deficit, no facial droop Normal strength in upper and lower extremities bilaterally including dorsiflexion and plantar flexion, strong and equal grip strength Sensation normal to light and sharp touch Moves extremities without ataxia, coordination intact Normal finger to nose and rapid alternating movements Neg romberg, no pronator drift Normal gait Normal heel-shin and balance   Skin: Skin is warm and dry. He is not diaphoretic.  Psychiatric: His behavior is normal.  Nursing note and vitals  reviewed.    ED Treatments / Results  Labs (all labs ordered are listed, but only abnormal results are displayed) Labs Reviewed - No data to display  EKG None  Radiology Ct Head Wo Contrast  Result Date: 08/21/2017 CLINICAL DATA:  Fall with laceration to the head EXAM: CT HEAD WITHOUT CONTRAST CT CERVICAL SPINE WITHOUT CONTRAST TECHNIQUE: Multidetector CT imaging of the head and cervical spine was performed following the standard protocol without intravenous contrast. Multiplanar CT image reconstructions of the cervical spine were also generated. COMPARISON:  None. FINDINGS: CT HEAD FINDINGS Brain: No acute territorial infarction or intracranial mass is visualized. Acute right subdural hematoma, measuring 18 mm maximum thickness in the right posterior temporal region. Convex margin of the hematoma along the right temporal convexity. Small amount of posterior inter hemispheric hemorrhage on the right. No significant midline shift. Localized mass effect on the right lateral ventricle. Small hematocrit level in the left lateral ventricle. Atrophy and small vessel ischemic changes of the white matter. The ventricular system is diffusely enlarged. Vascular: No hyperdense vessels.  Carotid vascular calcification. Skull: No fracture seen. Sinuses/Orbits: Mucosal thickening in the ethmoid sinuses. Other: None CT CERVICAL SPINE FINDINGS Alignment: Trace retrolisthesis C6 on C7. Facet alignment within normal limits. Skull base and vertebrae: Flattening of the occipital condyles without dislocation. No fracture Soft tissues and spinal canal: No prevertebral fluid or swelling. No visible canal hematoma. Disc levels: Fusion of C2-C3 and C4. Partial fusion of C5-C6. Marked degenerative change at C6-C7. Incomplete fusion anterior arch of C1 Upper chest: Lung apices are clear.  No thyroid mass Other: None IMPRESSION: 1. Acute right hemispheric extra-axial hematoma measuring up to 18 mm in thickness. Although there is  some convex margin of the blood collection at the posterior temporal lobe, suspect that hematoma is subdural. Small amount of posterior interhemispheric and right tentorial subdural hematoma. Small amount of left intraventricular hemorrhage. Localized mass effect on the right lateral ventricle but without significant midline shift. 2. Ventriculomegaly 3. 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 No acute fracture is seen. Critical Value/emergent results were called by telephone at the time of interpretation on 08/21/2017 at 8:12 pm to Dr. Margarita Mail , who verbally acknowledged these results. Electronically Signed   By: Donavan Foil M.D.   On: 08/21/2017 20:12   Ct Cervical Spine Wo Contrast  Result Date: 08/21/2017 CLINICAL DATA:  Fall with laceration to the head EXAM: CT HEAD WITHOUT CONTRAST CT CERVICAL SPINE WITHOUT CONTRAST TECHNIQUE: Multidetector CT imaging of the head and cervical spine was performed following the standard protocol without intravenous contrast. Multiplanar CT image reconstructions of the cervical spine were also generated. COMPARISON:  None. FINDINGS: CT HEAD FINDINGS Brain: No acute territorial infarction or intracranial mass is visualized. Acute right subdural hematoma, measuring 18 mm maximum thickness in the right posterior temporal region. Convex margin of the hematoma along the right temporal convexity.  Small amount of posterior inter hemispheric hemorrhage on the right. No significant midline shift. Localized mass effect on the right lateral ventricle. Small hematocrit level in the left lateral ventricle. Atrophy and small vessel ischemic changes of the white matter. The ventricular system is diffusely enlarged. Vascular: No hyperdense vessels.  Carotid vascular calcification. Skull: No fracture seen. Sinuses/Orbits: Mucosal thickening in the ethmoid sinuses. Other: None CT CERVICAL SPINE FINDINGS Alignment: Trace retrolisthesis C6 on C7.  Facet alignment within normal limits. Skull base and vertebrae: Flattening of the occipital condyles without dislocation. No fracture Soft tissues and spinal canal: No prevertebral fluid or swelling. No visible canal hematoma. Disc levels: Fusion of C2-C3 and C4. Partial fusion of C5-C6. Marked degenerative change at C6-C7. Incomplete fusion anterior arch of C1 Upper chest: Lung apices are clear.  No thyroid mass Other: None IMPRESSION: 1. Acute right hemispheric extra-axial hematoma measuring up to 18 mm in thickness. Although there is some convex margin of the blood collection at the posterior temporal lobe, suspect that hematoma is subdural. Small amount of posterior interhemispheric and right tentorial subdural hematoma. Small amount of left intraventricular hemorrhage. Localized mass effect on the right lateral ventricle but without significant midline shift. 2. Ventriculomegaly 3. 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 No acute fracture is seen. Critical Value/emergent results were called by telephone at the time of interpretation on 08/21/2017 at 8:12 pm to Dr. Margarita Mail , who verbally acknowledged these results. Electronically Signed   By: Donavan Foil M.D.   On: 08/21/2017 20:12    Procedures .Critical Care Performed by: Margarita Mail, PA-C Authorized by: Margarita Mail, PA-C   Critical care provider statement:    Critical care time (minutes):  50   Critical care time was exclusive of:  Separately billable procedures and treating other patients   Critical care was necessary to treat or prevent imminent or life-threatening deterioration of the following conditions:  CNS failure or compromise and circulatory failure   Critical care was time spent personally by me on the following activities:  Development of treatment plan with patient or surrogate, discussions with consultants, evaluation of patient's response to treatment, examination of patient,  interpretation of cardiac output measurements, ordering and performing treatments and interventions, ordering and review of laboratory studies, ordering and review of radiographic studies, pulse oximetry, re-evaluation of patient's condition and review of old charts   (including critical care time)  Medications Ordered in ED Medications - No data to display   Initial Impression / Assessment and Plan / ED Course  I have reviewed the triage vital signs and the nursing notes.  Pertinent labs & imaging results that were available during my care of the patient were reviewed by me and considered in my medical decision making (see chart for details).  Clinical Course as of Aug 22 2254  Wed Aug 21, 2017  1919 Patient here for mechanical fall and laceration.  I will review the chart for last tetanus vaccination.  The patient is covered in bruises and I have concern for worsening thrombocytopenia.  Feel it is necessary to obtain head CT given the trauma and the obvious bruising of the rest of his body. He is otherwise well-appearing.  He has no neurologic deficits.   [AH]  2056 Hemoglobin(!!): 5.8 [AH]  2057 Patient hemoglobin now 5.8.  There is a 1.2 g drop in his hemoglobin over the past 9 days.  His differential is currently pending   [AH]  2057 Patient had CT   [AH]  2058 Patient with acute subdural hematoma with mass-effect and intraventricular bleed.   [AH]  2119 Platelet count is being reviewed however initial is 48 which is unchanged from 9 days ago. I spoke with lab   [AH]    Clinical Course User Index [AH] Margarita Mail, PA-C     Patient with acute subdural hematoma. He is mentating normally without any obvious neuro deficits. The patient will be admitted by CCM due to his significant underlying medical conditions. The patient was given 2 units PRBCs and 1 unit of platelets. The patient will be admitted to the ICU. Pt stable in ED with no significant deterioration in  condition.   Final Clinical Impressions(s) / ED Diagnoses   Final diagnoses:  Subdural hematoma ([place])  Acute anemia  Thrombocytopenia [facility])    ED Discharge Orders    None       Margarita Mail, PA-C 08/21/17 2303    Tegeler, Gwenyth Allegra, MD 08/22/17 [phone]

## 2017-08-21 NOTE — H&P (Signed)
PULMONARY / CRITICAL CARE MEDICINE   Name: Reginald Daniels MRN: [id number] DOB: Jan 27, 1946    ADMISSION DATE:  08/21/2017 CONSULTATION DATE:  08/21/2017  REFERRING MD:  Margarita Mail, [facility]  CHIEF COMPLAINT:  Fall  HISTORY OF PRESENT ILLNESS:   72 year old male with past medical history significant for mental retardation, prostate cancer, thrombocytopenia and myelodysplastic disorder presenting from home after fall.  Patient lives with his legal guardian, [name].  He is followed by Dr. Irene Limbo with hematology.  States he ambulates at home with a walker but over the last month has had increasing falls.  He fell tonight hitting his head on a table and presented to ER for the possibility of needing sutures for small laceration between his eyebrows.  He denies loss of consciousness, headache, nausea or vomiting, bleeding, or complaints of pain.   In ER, he has been afebrile and hemodynamically stable and at baseline mental status.  Labs significant for WBC 18.7, Hgb 5.8 (previous transfusion goal > 7 or if symptomatic), Plt 48, sCr 1.57 (previously 1.49 in 07/16/17).  CT head noted for right subdural hematoma.  Neurosurgery evaluated with current plan for observation in the ICU. To be transfused PRBC and platelets   PCCM asked to admit.   PAST MEDICAL HISTORY :  He  has a past medical history of Cancer ([place]), Leukopenia, Macrocytosis, Mental retardation, and Thrombocytopenia ([place]).  PAST SURGICAL HISTORY: He  has no past surgical history on file.  No Known Allergies  No current facility-administered medications on file prior to encounter.    Current Outpatient Medications on File Prior to Encounter  Medication Sig  . b complex vitamins capsule Take 1 capsule by mouth daily.  . cetirizine (ZYRTEC) 10 MG tablet Take 10 mg by mouth daily.  . EPOETIN ALFA IJ Inject 1 each as directed every 14 (fourteen) days.  . diphenoxylate-atropine (LOMOTIL) 2.5-0.025 MG tablet Take 1 tablet by mouth 4  (four) times daily as needed for diarrhea or loose stools. (Patient not taking: Reported on 08/21/2017)    FAMILY HISTORY:  His indicated that his mother is deceased. He indicated that his father is deceased.   SOCIAL HISTORY: He  reports that he has never smoked. He has never used smokeless tobacco. He reports that he does not drink alcohol or use drugs.  REVIEW OF SYSTEMS:  POSITIVES IN BOLD  Gen: Denies fever, chills, weight change, fatigue, falls  HEENT: Denies vision changes, neck pain, dysphagia PULM: Denies shortness of breath, cough CV: Denies chest pain, orthopnea  GI: Denies abdominal pain, nausea, vomiting, diarrhea, change in bowel habits GU: Denies dysuria, hematuria, polyuria, oliguria Endocrine: Denies hot or cold intolerance, or appetite change Derm: Denies rash, or skin change Heme: Denies easy bruising, bleeding, bleeding gums Neuro: Denies headache, numbness, weakness, slurred speech, loss of memory or consciousness   SUBJECTIVE:   VITAL SIGNS: BP (!) 110/51   Pulse 79   Temp 98.7 F (37.1 C) (Oral)   Resp 14   Ht 5' (1.524 m)   Wt 128 lb (58.1 kg)   SpO2 99%   BMI 25.00 kg/m   HEMODYNAMICS:    VENTILATOR SETTINGS:    INTAKE / OUTPUT: No intake/output data recorded.  PHYSICAL EXAMINATION: General:  Elderly male lying in bed in NAD HEENT: MM pale/moist, pupils 3/reactive, anicteric, poor dentition, hard bulges noted to bottom palate, small laceration between eyebrows without bleeding Neuro: Awake, orient to self, place, not time, MAE, conversant/ pleasant  CV: rrr, no  m/r/g PULM: even/non-labored, lungs bilaterally clear GI: soft, non-tender, bs active  Extremities: warm/dry, +1 BLE edema  Skin: no rashes, numerous bruises of various stages to face/ scalp, shoulder, left chest, knees, arms  LABS:  BMET Recent Labs  Lab 08/21/17 2016  NA 139  K 3.8  CL 115*  CO2 20*  BUN 15  CREATININE 1.57*  GLUCOSE 98    Electrolytes Recent  Labs  Lab 08/21/17 2016  CALCIUM 8.2*    CBC Recent Labs  Lab 08/21/17 2016  WBC 18.7*  HGB 5.8*  HCT 19.2*  PLT 48*    Coag's Recent Labs  Lab 08/21/17 2016  INR 1.33    Sepsis Markers No results for input(s): LATICACIDVEN, PROCALCITON, O2SATVEN in the last 168 hours.  ABG No results for input(s): PHART, PCO2ART, PO2ART in the last 168 hours.  Liver Enzymes Recent Labs  Lab 08/21/17 2016  AST 32  ALT 22  ALKPHOS 48  BILITOT 1.2  ALBUMIN 2.9*    Cardiac Enzymes No results for input(s): TROPONINI, PROBNP in the last 168 hours.  Glucose No results for input(s): GLUCAP in the last 168 hours.  Imaging Ct Head Wo Contrast  Result Date: 08/21/2017 CLINICAL DATA:  Fall with laceration to the head EXAM: CT HEAD WITHOUT CONTRAST CT CERVICAL SPINE WITHOUT CONTRAST TECHNIQUE: Multidetector CT imaging of the head and cervical spine was performed following the standard protocol without intravenous contrast. Multiplanar CT image reconstructions of the cervical spine were also generated. COMPARISON:  None. FINDINGS: CT HEAD FINDINGS Brain: No acute territorial infarction or intracranial mass is visualized. Acute right subdural hematoma, measuring 18 mm maximum thickness in the right posterior temporal region. Convex margin of the hematoma along the right temporal convexity. Small amount of posterior inter hemispheric hemorrhage on the right. No significant midline shift. Localized mass effect on the right lateral ventricle. Small hematocrit level in the left lateral ventricle. Atrophy and small vessel ischemic changes of the white matter. The ventricular system is diffusely enlarged. Vascular: No hyperdense vessels.  Carotid vascular calcification. Skull: No fracture seen. Sinuses/Orbits: Mucosal thickening in the ethmoid sinuses. Other: None CT CERVICAL SPINE FINDINGS Alignment: Trace retrolisthesis C6 on C7. Facet alignment within normal limits. Skull base and vertebrae: Flattening  of the occipital condyles without dislocation. No fracture Soft tissues and spinal canal: No prevertebral fluid or swelling. No visible canal hematoma. Disc levels: Fusion of C2-C3 and C4. Partial fusion of C5-C6. Marked degenerative change at C6-C7. Incomplete fusion anterior arch of C1 Upper chest: Lung apices are clear.  No thyroid mass Other: None IMPRESSION: 1. Acute right hemispheric extra-axial hematoma measuring up to 18 mm in thickness. Although there is some convex margin of the blood collection at the posterior temporal lobe, suspect that hematoma is subdural. Small amount of posterior interhemispheric and right tentorial subdural hematoma. Small amount of left intraventricular hemorrhage. Localized mass effect on the right lateral ventricle but without significant midline shift. 2. Ventriculomegaly 3. 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 No acute fracture is seen. Critical Value/emergent results were called by telephone at the time of interpretation on 08/21/2017 at 8:12 pm to Dr. Margarita Mail , who verbally acknowledged these results. Electronically Signed   By: Donavan Foil M.D.   On: 08/21/2017 20:12   Ct Cervical Spine Wo Contrast  Result Date: 08/21/2017 CLINICAL DATA:  Fall with laceration to the head EXAM: CT HEAD WITHOUT CONTRAST CT CERVICAL SPINE WITHOUT CONTRAST TECHNIQUE: Multidetector  CT imaging of the head and cervical spine was performed following the standard protocol without intravenous contrast. Multiplanar CT image reconstructions of the cervical spine were also generated. COMPARISON:  None. FINDINGS: CT HEAD FINDINGS Brain: No acute territorial infarction or intracranial mass is visualized. Acute right subdural hematoma, measuring 18 mm maximum thickness in the right posterior temporal region. Convex margin of the hematoma along the right temporal convexity. Small amount of posterior inter hemispheric hemorrhage on the right. No  significant midline shift. Localized mass effect on the right lateral ventricle. Small hematocrit level in the left lateral ventricle. Atrophy and small vessel ischemic changes of the white matter. The ventricular system is diffusely enlarged. Vascular: No hyperdense vessels.  Carotid vascular calcification. Skull: No fracture seen. Sinuses/Orbits: Mucosal thickening in the ethmoid sinuses. Other: None CT CERVICAL SPINE FINDINGS Alignment: Trace retrolisthesis C6 on C7. Facet alignment within normal limits. Skull base and vertebrae: Flattening of the occipital condyles without dislocation. No fracture Soft tissues and spinal canal: No prevertebral fluid or swelling. No visible canal hematoma. Disc levels: Fusion of C2-C3 and C4. Partial fusion of C5-C6. Marked degenerative change at C6-C7. Incomplete fusion anterior arch of C1 Upper chest: Lung apices are clear.  No thyroid mass Other: None IMPRESSION: 1. Acute right hemispheric extra-axial hematoma measuring up to 18 mm in thickness. Although there is some convex margin of the blood collection at the posterior temporal lobe, suspect that hematoma is subdural. Small amount of posterior interhemispheric and right tentorial subdural hematoma. Small amount of left intraventricular hemorrhage. Localized mass effect on the right lateral ventricle but without significant midline shift. 2. Ventriculomegaly 3. 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 No acute fracture is seen. Critical Value/emergent results were called by telephone at the time of interpretation on 08/21/2017 at 8:12 pm to Dr. Margarita Mail , who verbally acknowledged these results. Electronically Signed   By: Donavan Foil M.D.   On: 08/21/2017 20:12   STUDIES:  7/3 CT head/ cervical >> 1. Acute right hemispheric extra-axial hematoma measuring up to 18 mm in thickness. Although there is some convex margin of the blood collection at the posterior temporal lobe,  suspect that hematoma is subdural. Small amount of posterior interhemispheric and right tentorial subdural hematoma. Small amount of left intraventricular hemorrhage. Localized mass effect on the right lateral ventricle but without significant midline shift. 2. Ventriculomegaly 3. 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 No acute fracture is seen.  CULTURES: 7/3 BC >>   ANTIBIOTICS: none  SIGNIFICANT EVENTS: 7/3 Admit   LINES/TUBES: PIV   DISCUSSION: 22 yoM w/PMH of MR and MDS with increasing falls over last month.  Found to have R SDH, Hgb 5.8, and Plt 48k.  NSGY following, observation only.  Remains asymptomatic and hemodynamically stable without focal deficits.  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 Patient to remain full code.  - Inter-disciplinary family meet or Palliative Care meeting due by:  7/10  Kennieth Rad, AGACNP-BC [place] Pgr: [phone] or if no answer [phone] 08/21/2017, 11:43 PM

## 2017-08-22 ENCOUNTER — Inpatient Hospital Stay (HOSPITAL_COMMUNITY): Payer: Medicare Other

## 2017-08-22 LAB — BASIC METABOLIC PANEL
Anion gap: 7 (ref 5–15)
BUN: 14 mg/dL (ref 8–23)
CALCIUM: 8.3 mg/dL — AB (ref 8.9–10.3)
CO2: 18 mmol/L — ABNORMAL LOW (ref 22–32)
Chloride: 117 mmol/L — ABNORMAL HIGH (ref 98–111)
Creatinine, Ser: 1.46 mg/dL — ABNORMAL HIGH (ref 0.61–1.24)
GFR, EST AFRICAN AMERICAN: 53 mL/min — AB (ref >60)
GFR, EST NON AFRICAN AMERICAN: 46 mL/min — AB (ref >60)
Glucose, Bld: 92 mg/dL (ref 70–99)
Potassium: 3.9 mmol/L (ref 3.5–5.1)
SODIUM: 142 mmol/L (ref 135–145)

## 2017-08-22 LAB — URINALYSIS, ROUTINE W REFLEX MICROSCOPIC
Bilirubin Urine: NEGATIVE
GLUCOSE, UA: NEGATIVE mg/dL
Hgb urine dipstick: NEGATIVE
KETONES UR: 5 mg/dL — AB
LEUKOCYTES UA: NEGATIVE
Nitrite: NEGATIVE
PROTEIN: NEGATIVE mg/dL
Specific Gravity, Urine: 1.013 (ref 1.005–1.030)
pH: 5 (ref 5.0–8.0)

## 2017-08-22 LAB — CBC
HCT: 25.9 % — ABNORMAL LOW (ref 39.0–52.0)
Hemoglobin: 8.4 g/dL — ABNORMAL LOW (ref 13.0–17.0)
MCH: 35.1 pg — ABNORMAL HIGH (ref 26.0–34.0)
MCHC: 32.4 g/dL (ref 30.0–36.0)
MCV: 108.4 fL — ABNORMAL HIGH (ref 78.0–100.0)
Platelets: 62 10*3/uL — ABNORMAL LOW (ref 150–400)
RBC: 2.39 MIL/uL — AB (ref 4.22–5.81)
RDW: 30 % — ABNORMAL HIGH (ref 11.5–15.5)
WBC: 18.2 10*3/uL — AB (ref 4.0–10.5)

## 2017-08-22 LAB — GLUCOSE, CAPILLARY
GLUCOSE-CAPILLARY: 124 mg/dL — AB (ref 70–99)
Glucose-Capillary: 85 mg/dL (ref 70–99)
Glucose-Capillary: 83 mg/dL (ref 70–99)
Glucose-Capillary: 112 mg/dL — ABNORMAL HIGH (ref 70–99)
Glucose-Capillary: 101 mg/dL — ABNORMAL HIGH (ref 70–99)
Glucose-Capillary: 140 mg/dL — ABNORMAL HIGH (ref 70–99)

## 2017-08-22 LAB — PROTIME-INR
INR: 1.32
PROTHROMBIN TIME: 16.2 s — AB (ref 11.4–15.2)

## 2017-08-22 LAB — PROCALCITONIN: Procalcitonin: 0.36 ng/mL

## 2017-08-22 LAB — PHOSPHORUS: Phosphorus: 3.8 mg/dL (ref 2.5–4.6)

## 2017-08-22 LAB — MRSA PCR SCREENING: MRSA by PCR: NEGATIVE

## 2017-08-22 LAB — MAGNESIUM: MAGNESIUM: 2 mg/dL (ref 1.7–2.4)

## 2017-08-22 MED ORDER — ACETAMINOPHEN 325 MG PO TABS
650.0000 mg | ORAL_TABLET | Freq: Four times a day (QID) | ORAL | Status: DC | PRN
  Administered 2017-08-22 – 2017-08-24 (×5): 650 mg via ORAL
  Filled 2017-08-22 (×5): qty 2

## 2017-08-22 NOTE — Progress Notes (Signed)
Subjective: Patient reports mild headache, wants to go home, denies NT  Objective: Vital signs in last 24 hours: Temp:  [97.6 F (36.4 C)-99 F (37.2 C)] 97.6 F (36.4 C) (07/04 0600) Pulse Rate:  [73-88] 77 (07/04 0600) Resp:  [14-22] 18 (07/04 0600) BP: (106-127)/(43-94) 123/58 (07/04 0600) SpO2:  [97 %-100 %] 99 % (07/04 0600) Weight:  [57.9 kg (127 lb 10.3 oz)-58.1 kg (128 lb)] 57.9 kg (127 lb 10.3 oz) (07/04 0442)  Intake/Output from previous day: 07/03 0701 - 07/04 0700 In: 919.2 [Blood:919.2] Out: 350 [Urine:350] Intake/Output this shift: No intake/output data recorded.  Neurologic: Grossly normal, awake alert, seems at baseline, MAEx4  Lab Results: Lab Results  Component Value Date   WBC 18.7 (H) 08/21/2017   HGB 5.8 (LL) 08/21/2017   HCT 19.2 (L) 08/21/2017   MCV 129.7 (H) 08/21/2017   PLT 48 (L) 08/21/2017   Lab Results  Component Value Date   INR 1.33 08/21/2017   BMET Lab Results  Component Value Date   NA 139 08/21/2017   K 3.8 08/21/2017   CL 115 (H) 08/21/2017   CO2 20 (L) 08/21/2017   GLUCOSE 98 08/21/2017   BUN 15 08/21/2017   CREATININE 1.57 (H) 08/21/2017   CALCIUM 8.2 (L) 08/21/2017    Studies/Results: Ct Head Wo Contrast  Result Date: 08/21/2017 CLINICAL DATA:  Fall with laceration to the head EXAM: CT HEAD WITHOUT CONTRAST CT CERVICAL SPINE WITHOUT CONTRAST TECHNIQUE: Multidetector CT imaging of the head and cervical spine was performed following the standard protocol without intravenous contrast. Multiplanar CT image reconstructions of the cervical spine were also generated. COMPARISON:  None. FINDINGS: CT HEAD FINDINGS Brain: No acute territorial infarction or intracranial mass is visualized. Acute right subdural hematoma, measuring 18 mm maximum thickness in the right posterior temporal region. Convex margin of the hematoma along the right temporal convexity. Small amount of posterior inter hemispheric hemorrhage on the right. No  significant midline shift. Localized mass effect on the right lateral ventricle. Small hematocrit level in the left lateral ventricle. Atrophy and small vessel ischemic changes of the white matter. The ventricular system is diffusely enlarged. Vascular: No hyperdense vessels.  Carotid vascular calcification. Skull: No fracture seen. Sinuses/Orbits: Mucosal thickening in the ethmoid sinuses. Other: None CT CERVICAL SPINE FINDINGS Alignment: Trace retrolisthesis C6 on C7. Facet alignment within normal limits. Skull base and vertebrae: Flattening of the occipital condyles without dislocation. No fracture Soft tissues and spinal canal: No prevertebral fluid or swelling. No visible canal hematoma. Disc levels: Fusion of C2-C3 and C4. Partial fusion of C5-C6. Marked degenerative change at C6-C7. Incomplete fusion anterior arch of C1 Upper chest: Lung apices are clear.  No thyroid mass Other: None IMPRESSION: 1. Acute right hemispheric extra-axial hematoma measuring up to 18 mm in thickness. Although there is some convex margin of the blood collection at the posterior temporal lobe, suspect that hematoma is subdural. Small amount of posterior interhemispheric and right tentorial subdural hematoma. Small amount of left intraventricular hemorrhage. Localized mass effect on the right lateral ventricle but without significant midline shift. 2. Ventriculomegaly 3. 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 No acute fracture is seen. Critical Value/emergent results were called by telephone at the time of interpretation on 08/21/2017 at 8:12 pm to Dr. Margarita Mail , who verbally acknowledged these results. Electronically Signed   By: Donavan Foil M.D.   On: 08/21/2017 20:12   Ct Cervical Spine Wo Contrast  Result  Date: 08/21/2017 CLINICAL DATA:  Fall with laceration to the head EXAM: CT HEAD WITHOUT CONTRAST CT CERVICAL SPINE WITHOUT CONTRAST TECHNIQUE: Multidetector CT imaging of the  head and cervical spine was performed following the standard protocol without intravenous contrast. Multiplanar CT image reconstructions of the cervical spine were also generated. COMPARISON:  None. FINDINGS: CT HEAD FINDINGS Brain: No acute territorial infarction or intracranial mass is visualized. Acute right subdural hematoma, measuring 18 mm maximum thickness in the right posterior temporal region. Convex margin of the hematoma along the right temporal convexity. Small amount of posterior inter hemispheric hemorrhage on the right. No significant midline shift. Localized mass effect on the right lateral ventricle. Small hematocrit level in the left lateral ventricle. Atrophy and small vessel ischemic changes of the white matter. The ventricular system is diffusely enlarged. Vascular: No hyperdense vessels.  Carotid vascular calcification. Skull: No fracture seen. Sinuses/Orbits: Mucosal thickening in the ethmoid sinuses. Other: None CT CERVICAL SPINE FINDINGS Alignment: Trace retrolisthesis C6 on C7. Facet alignment within normal limits. Skull base and vertebrae: Flattening of the occipital condyles without dislocation. No fracture Soft tissues and spinal canal: No prevertebral fluid or swelling. No visible canal hematoma. Disc levels: Fusion of C2-C3 and C4. Partial fusion of C5-C6. Marked degenerative change at C6-C7. Incomplete fusion anterior arch of C1 Upper chest: Lung apices are clear.  No thyroid mass Other: None IMPRESSION: 1. Acute right hemispheric extra-axial hematoma measuring up to 18 mm in thickness. Although there is some convex margin of the blood collection at the posterior temporal lobe, suspect that hematoma is subdural. Small amount of posterior interhemispheric and right tentorial subdural hematoma. Small amount of left intraventricular hemorrhage. Localized mass effect on the right lateral ventricle but without significant midline shift. 2. Ventriculomegaly 3. Klippel - Brandon Melnick abnormality of  the cervical spine with anterior arch anomaly at C1. No acute fracture is seen. Critical Value/emergent results were called by telephone at the time of interpretation on 08/21/2017 at 8:12 pm to Dr. Margarita Mail , who verbally acknowledged these results. Electronically Signed   By: Donavan Foil M.D.   On: 08/21/2017 20:12   Dg Chest Port 1 View  Result Date: 08/22/2017 CLINICAL DATA:  [place] today.  Possible additional recent falls. EXAM: PORTABLE CHEST 1 VIEW COMPARISON:  03/22/2015 FINDINGS: Heart size is normal for technique. No vascular congestion, edema, or consolidation. No blunting of costophrenic angles. No pneumothorax. Mediastinal contours appear intact. Degenerative changes in the spine. IMPRESSION: No active disease. Electronically Signed   By: Lucienne Capers M.D.   On: 08/22/2017 00:13    Assessment/Plan: Mobilize, check CT head, he is stable, PT/OT  Estimated body mass index is 24.93 kg/m as calculated from the following:   Height as of this encounter: 5' (1.524 m).   Weight as of this encounter: 57.9 kg (127 lb 10.3 oz).    LOS: 1 day    Breta Demedeiros S 08/22/2017, 7:34 AM

## 2017-08-22 NOTE — Progress Notes (Signed)
Physical Therapy Treatment Patient Details Name: TRESTAN VAHLE MRN: [id number] DOB: Aug 06, 1945 Today's Date: 08/22/2017    History of Present Illness 72 year old male with past medical history significant for mental retardation, prostate cancer, thrombocytopenia and myelodysplastic disorder presenting from home after fall.  CT showing SDH in the right medial fossa.    PT Comments    Cues and assistance to scoot, stand up and walk to the toilet.  Help keep pt on task and assist with ambulation to bed, assisting and calling attention to his right leg lagging behind to help pt bring it through (with less assist)   Follow Up Recommendations  Home health PT;Supervision/Assistance - 24 hour     Equipment Recommendations  Wheelchair (measurements PT);Wheelchair cushion (measurements PT)    Recommendations for Other Services       Precautions / Restrictions Precautions Precautions: Fall    Mobility  Bed Mobility Overal bed mobility: Needs Assistance Bed Mobility: Supine to Sit     Supine to sit: Min assist     General bed mobility comments: pt needed cues for initiation, direction and stability assist while he  Transfers Overall transfer level: Needs assistance Equipment used: 2 person hand held assist Transfers: Sit to/from Stand Sit to Stand: Min assist Stand pivot transfers: Mod assist;+2 physical assistance;+2 safety/equipment       General transfer comment: cues to scoot to get his feet on the floor, assist to help him come forward more than boost.  Ambulation/Gait Ambulation/Gait assistance: Min assist Gait Distance (Feet): 15 Feet(x2 to/from bathroom) Assistive device: 1 person hand held assist Gait Pattern/deviations: Step-through pattern;Decreased step length - right;Decreased stride length Gait velocity: slower Gait velocity interpretation: <1.31 ft/sec, indicative of household ambulator General Gait Details: pt needed some facilitation to assist with R LE  follow through otherwise needed stability assist   Stairs             Wheelchair Mobility    Modified Rankin (Stroke Patients Only)       Balance Overall balance assessment: Needs assistance Sitting-balance support: Feet supported;Single extremity supported Sitting balance-Leahy Scale: Poor Sitting balance - Comments: requires min - mod A.  Pt becomes very anxious    Standing balance support: During functional activity;No upper extremity supported Standing balance-Leahy Scale: Poor Standing balance comment: requires min - mod A                             Cognition Arousal/Alertness: Awake/alert Behavior During Therapy: WFL for tasks assessed/performed Overall Cognitive Status: History of cognitive impairments - at baseline                                 General Comments: Per chart, guardian indicated pt is at baseline cognitively       Exercises      General Comments General comments (skin integrity, edema, etc.): VSS      Pertinent Vitals/Pain Pain Assessment: No/denies pain    Home Living Family/patient expects to be discharged to:: Other (Comment)(into the custody of his "DAD", court appointed power of atto) Living Arrangements: Other (Comment)(guardian)                  Prior Function Level of Independence: Needs assistance  Gait / Transfers Assistance Needed: ambulated with RW, but h/o recent falls  ADL's / Homemaking Assistance Needed: walked to bathroom, brushed his teeth, dressed  himself after set up, per pt report  Comments: guardian not present    PT Goals (current goals can now be found in the care plan section) Acute Rehab PT Goals Patient Stated Goal: "I want to go home!"  Progress towards PT goals: Progressing toward goals    Frequency    Min 3X/week      PT Plan Current plan remains appropriate    Co-evaluation PT/OT/SLP Co-Evaluation/Treatment: Yes Reason for Co-Treatment: Complexity of the  patient's impairments (multi-system involvement) PT goals addressed during session: Mobility/safety with mobility        AM-PAC PT "6 Clicks" Daily Activity  Outcome Measure  Difficulty turning over in bed (including adjusting bedclothes, sheets and blankets)?: Unable Difficulty moving from lying on back to sitting on the side of the bed? : Unable Difficulty sitting down on and standing up from a chair with arms (e.g., wheelchair, bedside commode, etc,.)?: Unable Help needed moving to and from a bed to chair (including a wheelchair)?: A Little Help needed walking in hospital room?: A Little   6 Click Score: 9    End of Session   Activity Tolerance: Patient tolerated treatment well Patient left: in bed;with call bell/phone within reach;with bed alarm set Nurse Communication: Mobility status PT Visit Diagnosis: Unsteadiness on feet (R26.81);Difficulty in walking, not elsewhere classified (R26.2)     Time: 2761-4709 PT Time Calculation (min) (ACUTE ONLY): 12 min  Charges:  $Gait Training: 8-22 mins                    G Codes:       09/09/17  Donnella Sham, PT [phone]  (pager)   Tessie Fass Jaleyah Longhi 09/09/2017, 7:08 PM

## 2017-08-22 NOTE — Progress Notes (Signed)
Pt's guardian states pt gets Darbepoetin bi-weekly and the next shot is schedule for 08/26/2017 at [facility].  Cancer Center is currently closed and MD notified.   No new orders at this time.

## 2017-08-22 NOTE — Care Management Note (Signed)
Case Management Note  Patient Details  Name: Reginald Daniels MRN: [id number] Date of Birth: 07-25-1945  Subjective/Objective:   Pt admitted on 08/21/17 s/p fall with RT SDH.  PTA, pt resided at home with his legal guardian, Reginald Daniels.  He ambulates with a walker at baseline, and has hx of mental retardation.                    Action/Plan: PT/OT evaluations pending. Will follow for discharge planning as pt progresses.   Expected Discharge Date:                  Expected Discharge Plan:     In-House Referral:     Discharge planning Services  CM Consult  Post Acute Care Choice:    Choice offered to:     DME Arranged:    DME Agency:     HH Arranged:    HH Agency:     Status of Service:  In process, will continue to follow  If discussed at Long Length of Stay Meetings, dates discussed:    Additional Comments:  Ella Bodo, RN 08/22/2017, 2:05 PM

## 2017-08-22 NOTE — Evaluation (Signed)
Occupational Therapy Evaluation Patient Details Name: Reginald Daniels MRN: [id number] DOB: August 16, 1945 Today's Date: 08/22/2017    History of Present Illness 72 year old male with past medical history significant for mental retardation, prostate cancer, thrombocytopenia and myelodysplastic disorder presenting from home after fall.  CT showing SDH in the right medial fossa.   Clinical Impression   Pt admitted with above. He demonstrates the below listed deficits and will benefit from continued OT to maximize safety and independence with BADLs.   Pt presents to OT with impaired balance, decreased activity tolerance, generalized weakness, and cognitive deficits.  He currently requires min - total A for ADLs and mod A +2 for functional mobility due to Rt LE weakness/inattention.  Pt lived with legal guardian and was able to perform ADLs with min guard assist to min A, per pt report.  He has a h/o recent falls, per the chart. Pt is eager to discharge home.  Feel this would be manageable if guardian able to provide necessary level of care.  It guardian is limited with his ability to assist him physically with ambulation, pt could likely function at w/c level until balance improves.  IF caregiver, unable to provide needed level of assist, he  Will  Likely require SNF.       Follow Up Recommendations  Home health OT;Supervision/Assistance - 24 hour    Equipment Recommendations  3 in 1 bedside commode;Wheelchair (measurements OT);Wheelchair cushion (measurements OT)    Recommendations for Other Services       Precautions / Restrictions Precautions Precautions: Fall      Mobility Bed Mobility Overal bed mobility: Needs Assistance Bed Mobility: Supine to Sit     Supine to sit: Min assist     General bed mobility comments: pt needed cues for initiation, direction and stability assist while he  Transfers Overall transfer level: Needs assistance Equipment used: 2 person hand held  assist Transfers: Sit to/from Stand Sit to Stand: Min assist Stand pivot transfers: Mod assist;+2 physical assistance;+2 safety/equipment       General transfer comment: cues to scoot to get his feet on the floor, assist to help him come forward more than boost.    Balance Overall balance assessment: Needs assistance Sitting-balance support: Feet supported;Single extremity supported Sitting balance-Leahy Scale: Poor Sitting balance - Comments: requires min - mod A.  Pt becomes very anxious    Standing balance support: During functional activity;No upper extremity supported Standing balance-Leahy Scale: Poor Standing balance comment: requires min - mod A                            ADL either performed or assessed with clinical judgement   ADL Overall ADL's : Needs assistance/impaired Eating/Feeding: Maximal assistance;Sitting Eating/Feeding Details (indicate cue type and reason): Pt makes no attempt to initiate self feeding  Grooming: Wash/dry hands;Moderate assistance;Standing Grooming Details (indicate cue type and reason): assist for balance and thoroughness  Upper Body Bathing: Minimal assistance;Sitting   Lower Body Bathing: Moderate assistance;Sit to/from stand   Upper Body Dressing : Moderate assistance;Sitting   Lower Body Dressing: Total assistance;Sit to/from stand   Toilet Transfer: Moderate assistance;+2 for physical assistance;Ambulation;Comfort height toilet   Toileting- Clothing Manipulation and Hygiene: Total assistance;Sit to/from stand       Functional mobility during ADLs: Moderate assistance;+2 for physical assistance;+2 for safety/equipment       Vision Patient Visual Report: No change from baseline Additional Comments: will require further assessment.  Pt able to locate needed items in bathroom      Perception     Praxis Praxis Praxis tested?: Within functional limits    Pertinent Vitals/Pain Pain Assessment: No/denies pain      Hand Dominance     Extremity/Trunk Assessment Upper Extremity Assessment Upper Extremity Assessment: Generalized weakness   Lower Extremity Assessment Lower Extremity Assessment: Defer to PT evaluation   Cervical / Trunk Assessment Cervical / Trunk Assessment: Kyphotic   Communication Communication Communication: No difficulties   Cognition Arousal/Alertness: Awake/alert Behavior During Therapy: WFL for tasks assessed/performed Overall Cognitive Status: History of cognitive impairments - at baseline                                 General Comments: Per chart, guardian indicated pt is at baseline cognitively    General Comments  VSS    Exercises     Shoulder Instructions      Home Living Family/patient expects to be discharged to:: Other (Comment)(into the custody of his "DAD", court appointed power of atto) Living Arrangements: Other (Comment)(guardian)                                      Prior Functioning/Environment Level of Independence: Needs assistance  Gait / Transfers Assistance Needed: ambulated with RW, but h/o recent falls  ADL's / Homemaking Assistance Needed: walked to bathroom, brushed his teeth, dressed himself after set up, per pt report    Comments: guardian not present         OT Problem List: Decreased strength;Decreased activity tolerance;Impaired balance (sitting and/or standing);Decreased cognition;Decreased safety awareness      OT Treatment/Interventions: Self-care/ADL training;Energy conservation;DME and/or AE instruction;Therapeutic activities;Cognitive remediation/compensation;Patient/family education;Balance training    OT Goals(Current goals can be found in the care plan section) Acute Rehab OT Goals Patient Stated Goal: "I want to go home!"  OT Goal Formulation: With patient Time For Goal Achievement: 09/05/17 Potential to Achieve Goals: Good ADL Goals Pt Will Perform Eating: with modified  independence;sitting Pt Will Perform Grooming: with min guard assist;standing Pt Will Perform Upper Body Bathing: with set-up;with supervision;sitting Pt Will Perform Lower Body Bathing: with min assist;sit to/from stand Pt Will Perform Upper Body Dressing: with set-up;with supervision;sitting Pt Will Perform Lower Body Dressing: with min assist;sit to/from stand Pt Will Transfer to Toilet: with min assist;ambulating;regular height toilet;bedside commode;grab bars Pt Will Perform Toileting - Clothing Manipulation and hygiene: with min assist;sit to/from stand  OT Frequency: Min 2X/week   Barriers to D/C:            Co-evaluation   Reason for Co-Treatment: Complexity of the patient's impairments (multi-system involvement) PT goals addressed during session: Mobility/safety with mobility        AM-PAC PT "6 Clicks" Daily Activity     Outcome Measure Help from another person eating meals?: A Lot Help from another person taking care of personal grooming?: A Lot Help from another person toileting, which includes using toliet, bedpan, or urinal?: A Lot Help from another person bathing (including washing, rinsing, drying)?: A Lot Help from another person to put on and taking off regular upper body clothing?: A Lot Help from another person to put on and taking off regular lower body clothing?: Total 6 Click Score: 11   End of Session Nurse Communication: Mobility status  Activity Tolerance: Patient tolerated treatment  well Patient left: in chair;with call bell/phone within reach;with chair alarm set  OT Visit Diagnosis: Unsteadiness on feet (R26.81)                Time: 4585-9292 OT Time Calculation (min): 18 min Charges:  OT General Charges $OT Visit: 1 Visit OT Evaluation $OT Eval Moderate Complexity: 1 Mod G-Codes:     [facility], OTR/L [phone]   Lucille Passy M 08/22/2017, 7:07 PM

## 2017-08-22 NOTE — Progress Notes (Signed)
PULMONARY / CRITICAL CARE MEDICINE   Name: Reginald Daniels MRN: [id number] DOB: 14-Sep-1945    ADMISSION DATE:  08/21/2017   CHIEF COMPLAINT:  Fall with SDH  HISTORY OF PRESENT ILLNESS:       72 year old male with past medical history significant for mental retardation, prostate cancer, thrombocytopenia and myelodysplastic disorder presenting from home after fall.  Patient lives with his legal guardian, [name].  He is followed by Dr. Irene Limbo with hematology.  States he ambulates at home with a walker but over the last month has had increasing falls.  He fell tonight hitting his head on a table and presented to ER for the possibility of needing sutures for small laceration between his eyebrows.  He denies loss of consciousness, headache, nausea or vomiting, bleeding, or complaints of pain.   In ER, he has been afebrile and hemodynamically stable and at baseline mental status.  Labs significant for WBC 18.7, Hgb 5.8 (previous transfusion goal > 7 or if symptomatic), Plt 48, sCr 1.57 (previously 1.49 in 07/16/17).  CT head noted for right subdural hematoma.  Neurosurgery evaluated with current plan for observation in the ICU. To be transfused PRBC and platelets   PCCM asked to admit.   This morning his guardian reports that his mental status is at baseline.  He is interactive with me, denies headache, denies focal deficits.  Of note he was extremely anemic on examination and his guardian reports that he recently underwent a polypectomy.     PAST MEDICAL HISTORY :  He  has a past medical history of Cancer ([place]), Leukopenia, Macrocytosis, Mental retardation, and Thrombocytopenia ([place]).  PAST SURGICAL HISTORY: He  has no past surgical history on file.  No Known Allergies  No current facility-administered medications on file prior to encounter.    Current Outpatient Medications on File Prior to Encounter  Medication Sig  . b complex vitamins capsule Take 1 capsule by mouth daily.  .  cetirizine (ZYRTEC) 10 MG tablet Take 10 mg by mouth daily.  . EPOETIN ALFA IJ Inject 1 each as directed every 14 (fourteen) days.  . diphenoxylate-atropine (LOMOTIL) 2.5-0.025 MG tablet Take 1 tablet by mouth 4 (four) times daily as needed for diarrhea or loose stools. (Patient not taking: Reported on 08/21/2017)    FAMILY HISTORY:  His indicated that his mother is deceased. He indicated that his father is deceased.   SOCIAL HISTORY: He  reports that he has never smoked. He has never used smokeless tobacco. He reports that he does not drink alcohol or use drugs.     SUBJECTIVE:  As above  VITAL SIGNS: BP 122/61   Pulse 76   Temp 97.7 F (36.5 C) (Oral)   Resp 16   Ht 5' (1.524 m)   Wt 127 lb 10.3 oz (57.9 kg)   SpO2 98%   BMI 24.93 kg/m   HEMODYNAMICS:    VENTILATOR SETTINGS:    INTAKE / OUTPUT: I/O last 3 completed shifts: In: 919.2 [Blood:919.2] Out: 350 [Urine:350]  PHYSICAL EXAMINATION: General: Pleasant but somewhat inappropriate and in no distress. Neuro: Conversant, pupils equal EOMs full, face symmetric, moving all fours vigorously. HEENT: Remarkable  torus Cardiovascular: S1 and S2 are regular without murmur rub or gallop, there is no JVD Lungs: Respirations are unlabored, there is symmetric air movement, no wheezes. Abdomen: Abdomen is flat and soft without any organomegaly masses or tenderness.   LABS:  BMET Recent Labs  Lab 08/21/17 2016 08/22/17 0719  NA  139 142  K 3.8 3.9  CL 115* 117*  CO2 20* 18*  BUN 15 14  CREATININE 1.57* 1.46*  GLUCOSE 98 92    Electrolytes Recent Labs  Lab 08/21/17 2016 08/22/17 0719  CALCIUM 8.2* 8.3*  MG  --  2.0  PHOS  --  3.8    CBC Recent Labs  Lab 08/21/17 2016 08/22/17 0719  WBC 18.7* 18.2*  HGB 5.8* 8.4*  HCT 19.2* 25.9*  PLT 48* 62*    Coag's Recent Labs  Lab 08/21/17 2016 08/22/17 0719  INR 1.33 1.32    Sepsis Markers No results for input(s): LATICACIDVEN, PROCALCITON,  O2SATVEN in the last 168 hours.  ABG No results for input(s): PHART, PCO2ART, PO2ART in the last 168 hours.  Liver Enzymes Recent Labs  Lab 08/21/17 2016  AST 32  ALT 22  ALKPHOS 48  BILITOT 1.2  ALBUMIN 2.9*    Cardiac Enzymes No results for input(s): TROPONINI, PROBNP in the last 168 hours.  Glucose Recent Labs  Lab 08/22/17 0428 08/22/17 0722  GLUCAP 85 83    Imaging Ct Head Wo Contrast  Result Date: 08/22/2017 CLINICAL DATA:  Followup subdural hematoma. EXAM: CT HEAD WITHOUT CONTRAST TECHNIQUE: Contiguous axial images were obtained from the base of the skull through the vertex without intravenous contrast. COMPARISON:  08/21/2017 FINDINGS: Brain: No increase in right convexity subdural hematoma when compared yesterday. Maximal thickness in the middle cranial fossa is 18 mm. Thickness in the right parietal region is 11 mm. Small component along the tentorium and falx is no larger. No midline shift. Small amount of blood dependent in the occipital horns slightly increased, indicating that there is at least some subarachnoid component. No sign of intraparenchymal hemorrhage. Ventricular size is stable with chronic ventriculomegaly. No sign acute parenchymal infarction or injury. Old small vessel infarctions of the cerebellum and cerebral hemispheric white matter. Vascular: There is atherosclerotic calcification of the major vessels at the base of the brain. Skull: No skull fracture. Sinuses/Orbits: Clear/normal Other: None IMPRESSION: No evidence of increasing subdural blood compared yesterday's exam. Maximal thickness in the middle cranial fossa on the right is 18 mm. Despite this, there is no midline shift. Slight increase in the amount blood layering dependently in the occipital horns of lateral ventricles, indicating that there is at least some subarachnoid component. Electronically Signed   By: Nelson Chimes M.D.   On: 08/22/2017 08:28   Ct Head Wo Contrast  Result Date:  08/21/2017 CLINICAL DATA:  Fall with laceration to the head EXAM: CT HEAD WITHOUT CONTRAST CT CERVICAL SPINE WITHOUT CONTRAST TECHNIQUE: Multidetector CT imaging of the head and cervical spine was performed following the standard protocol without intravenous contrast. Multiplanar CT image reconstructions of the cervical spine were also generated. COMPARISON:  None. FINDINGS: CT HEAD FINDINGS Brain: No acute territorial infarction or intracranial mass is visualized. Acute right subdural hematoma, measuring 18 mm maximum thickness in the right posterior temporal region. Convex margin of the hematoma along the right temporal convexity. Small amount of posterior inter hemispheric hemorrhage on the right. No significant midline shift. Localized mass effect on the right lateral ventricle. Small hematocrit level in the left lateral ventricle. Atrophy and small vessel ischemic changes of the white matter. The ventricular system is diffusely enlarged. Vascular: No hyperdense vessels.  Carotid vascular calcification. Skull: No fracture seen. Sinuses/Orbits: Mucosal thickening in the ethmoid sinuses. Other: None CT CERVICAL SPINE FINDINGS Alignment: Trace retrolisthesis C6 on C7. Facet alignment within normal limits. Skull base  and vertebrae: Flattening of the occipital condyles without dislocation. No fracture Soft tissues and spinal canal: No prevertebral fluid or swelling. No visible canal hematoma. Disc levels: Fusion of C2-C3 and C4. Partial fusion of C5-C6. Marked degenerative change at C6-C7. Incomplete fusion anterior arch of C1 Upper chest: Lung apices are clear.  No thyroid mass Other: None IMPRESSION: 1. Acute right hemispheric extra-axial hematoma measuring up to 18 mm in thickness. Although there is some convex margin of the blood collection at the posterior temporal lobe, suspect that hematoma is subdural. Small amount of posterior interhemispheric and right tentorial subdural hematoma. Small amount of left  intraventricular hemorrhage. Localized mass effect on the right lateral ventricle but without significant midline shift. 2. Ventriculomegaly 3. 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 No acute fracture is seen. Critical Value/emergent results were called by telephone at the time of interpretation on 08/21/2017 at 8:12 pm to Dr. Margarita Mail , who verbally acknowledged these results. Electronically Signed   By: Donavan Foil M.D.   On: 08/21/2017 20:12   Ct Cervical Spine Wo Contrast  Result Date: 08/21/2017 CLINICAL DATA:  Fall with laceration to the head EXAM: CT HEAD WITHOUT CONTRAST CT CERVICAL SPINE WITHOUT CONTRAST TECHNIQUE: Multidetector CT imaging of the head and cervical spine was performed following the standard protocol without intravenous contrast. Multiplanar CT image reconstructions of the cervical spine were also generated. COMPARISON:  None. FINDINGS: CT HEAD FINDINGS Brain: No acute territorial infarction or intracranial mass is visualized. Acute right subdural hematoma, measuring 18 mm maximum thickness in the right posterior temporal region. Convex margin of the hematoma along the right temporal convexity. Small amount of posterior inter hemispheric hemorrhage on the right. No significant midline shift. Localized mass effect on the right lateral ventricle. Small hematocrit level in the left lateral ventricle. Atrophy and small vessel ischemic changes of the white matter. The ventricular system is diffusely enlarged. Vascular: No hyperdense vessels.  Carotid vascular calcification. Skull: No fracture seen. Sinuses/Orbits: Mucosal thickening in the ethmoid sinuses. Other: None CT CERVICAL SPINE FINDINGS Alignment: Trace retrolisthesis C6 on C7. Facet alignment within normal limits. Skull base and vertebrae: Flattening of the occipital condyles without dislocation. No fracture Soft tissues and spinal canal: No prevertebral fluid or swelling. No visible  canal hematoma. Disc levels: Fusion of C2-C3 and C4. Partial fusion of C5-C6. Marked degenerative change at C6-C7. Incomplete fusion anterior arch of C1 Upper chest: Lung apices are clear.  No thyroid mass Other: None IMPRESSION: 1. Acute right hemispheric extra-axial hematoma measuring up to 18 mm in thickness. Although there is some convex margin of the blood collection at the posterior temporal lobe, suspect that hematoma is subdural. Small amount of posterior interhemispheric and right tentorial subdural hematoma. Small amount of left intraventricular hemorrhage. Localized mass effect on the right lateral ventricle but without significant midline shift. 2. Ventriculomegaly 3. 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 No acute fracture is seen. Critical Value/emergent results were called by telephone at the time of interpretation on 08/21/2017 at 8:12 pm to Dr. Margarita Mail , who verbally acknowledged these results. Electronically Signed   By: Donavan Foil M.D.   On: 08/21/2017 20:12   Dg Chest Port 1 View  Result Date: 08/22/2017 CLINICAL DATA:  [place] today.  Possible additional recent falls. EXAM: PORTABLE CHEST 1 VIEW COMPARISON:  03/22/2015 FINDINGS: Heart size is normal for technique. No vascular congestion, edema, or consolidation.  No blunting of costophrenic angles. No pneumothorax. Mediastinal contours appear intact. Degenerative changes in the spine. IMPRESSION: No active disease. Electronically Signed   By: Lucienne Capers M.D.   On: 08/22/2017 00:13     STUDIES:  CT scan of the head this morning shows essentially no change from presentation  DISCUSSION:    This is a 72 year old with myelodysplastic syndrome and chronic thrombocytopenia who presented after a fall.  CT scan showed a subdural hematoma and he was admitted for treatment of thrombocytopenia and observation.  ASSESSMENT / PLAN:  PULMONARY A: No issues  CARDIOVASCULAR A: No  issues  GASTROINTESTINAL A: He had a recent polypectomy and presented with a much lower than usual hemoglobin.  I am suspicious of concurrent GI loss in addition to the anemia from his MDS.  Occult bloods have been ordered  HEMATOLOGIC.  He had a very marginal response to platelet transfusion.  Should he remain refractory to transfusion and have ongoing bleeding consideration will be given to the administration of Amicar.  NEUROLOGIC A: Right subdural hematoma with stable neurologic examination and stable CT exam.  Continue to monitor platelets.    Lars Masson, MD Critical Care Medicine [facility] Pager: [phone]  08/22/2017, 9:41 AM

## 2017-08-22 NOTE — Evaluation (Signed)
Physical Therapy Evaluation Patient Details Name: Reginald Daniels MRN: [id number] DOB: 06-Oct-1945 Today's Date: 08/22/2017   History of Present Illness  72 year old male with past medical history significant for mental retardation, prostate cancer, thrombocytopenia and myelodysplastic disorder presenting from home after fall.  CT showing SDH in the right medial fossa.  Clinical Impression  Pt admitted with/for fall with resultant SDH and R sided weakness and inattention.  Pt currently limited functionally due to the problems listed below.  (see problems list.)  Pt will benefit from PT to maximize function and safety to be able to get home safely with available assist.     Follow Up Recommendations Home health PT;Supervision/Assistance - 24 hour    Equipment Recommendations  Wheelchair (measurements PT);Wheelchair cushion (measurements PT)    Recommendations for Other Services       Precautions / Restrictions Precautions Precautions: Fall      Mobility  Bed Mobility Overal bed mobility: Needs Assistance Bed Mobility: Supine to Sit     Supine to sit: Min assist     General bed mobility comments: pt needed cues for initiation, direction and stability assist while he  Transfers Overall transfer level: Needs assistance Equipment used: 2 person hand held assist Transfers: Sit to/from Stand Sit to Stand: Min assist Stand pivot transfers: Mod assist;+2 physical assistance;+2 safety/equipment       General transfer comment: cues to scoot to get his feet on the floor, assist to help him come forward more than boost.  Ambulation/Gait Ambulation/Gait assistance: Min assist Gait Distance (Feet): 15 Feet(x2 to/from bathroom) Assistive device: 1 person hand held assist Gait Pattern/deviations: Step-through pattern;Decreased step length - right;Decreased stride length Gait velocity: slower Gait velocity interpretation: <1.31 ft/sec, indicative of household ambulator General  Gait Details: pt needed some facilitation to assist with R LE follow through otherwise needed stability assist  Stairs            Wheelchair Mobility    Modified Rankin (Stroke Patients Only)       Balance Overall balance assessment: Needs assistance Sitting-balance support: Feet supported;Single extremity supported Sitting balance-Leahy Scale: Poor Sitting balance - Comments: requires min - mod A.  Pt becomes very anxious    Standing balance support: During functional activity;No upper extremity supported Standing balance-Leahy Scale: Poor Standing balance comment: requires min - mod A                              Pertinent Vitals/Pain Pain Assessment: No/denies pain    Home Living Family/patient expects to be discharged to:: Other (Comment)(into the custody of his "DAD", court appointed power of atto) Living Arrangements: Other (Comment)(guardian)                    Prior Function Level of Independence: Needs assistance   Gait / Transfers Assistance Needed: ambulated with RW, but h/o recent falls   ADL's / Homemaking Assistance Needed: walked to bathroom, brushed his teeth, dressed himself after set up, per pt report   Comments: guardian not present      Hand Dominance        Extremity/Trunk Assessment   Upper Extremity Assessment Upper Extremity Assessment: Generalized weakness    Lower Extremity Assessment Lower Extremity Assessment: Defer to PT evaluation    Cervical / Trunk Assessment Cervical / Trunk Assessment: Kyphotic  Communication   Communication: No difficulties  Cognition Arousal/Alertness: Awake/alert Behavior During Therapy: WFL for tasks assessed/performed  Overall Cognitive Status: History of cognitive impairments - at baseline                                 General Comments: Per chart, guardian indicated pt is at baseline cognitively       General Comments General comments (skin integrity,  edema, etc.): VSS    Exercises     Assessment/Plan    PT Assessment Patient needs continued PT services  PT Problem List Decreased strength;Decreased activity tolerance;Decreased balance;Decreased mobility;Decreased cognition       PT Treatment Interventions DME instruction;Gait training;Functional mobility training;Therapeutic activities;Balance training;Patient/family education    PT Goals (Current goals can be found in the Care Plan section)  Acute Rehab PT Goals Patient Stated Goal: "I want to go home!"  PT Goal Formulation: With patient Time For Goal Achievement: 09/05/17 Potential to Achieve Goals: Good    Frequency Min 3X/week   Barriers to discharge        Co-evaluation PT/OT/SLP Co-Evaluation/Treatment: Yes Reason for Co-Treatment: Complexity of the patient's impairments (multi-system involvement) PT goals addressed during session: Mobility/safety with mobility         AM-PAC PT "6 Clicks" Daily Activity  Outcome Measure Difficulty turning over in bed (including adjusting bedclothes, sheets and blankets)?: Unable Difficulty moving from lying on back to sitting on the side of the bed? : Unable Difficulty sitting down on and standing up from a chair with arms (e.g., wheelchair, bedside commode, etc,.)?: Unable Help needed moving to and from a bed to chair (including a wheelchair)?: A Little Help needed walking in hospital room?: A Little Help needed climbing 3-5 steps with a railing? : A Lot 6 Click Score: 11    End of Session Equipment Utilized During Treatment: Gait belt Activity Tolerance: Patient tolerated treatment well Patient left: in bed;with call bell/phone within reach;with bed alarm set Nurse Communication: Mobility status PT Visit Diagnosis: Unsteadiness on feet (R26.81);Difficulty in walking, not elsewhere classified (R26.2)    Time: 6269-4854 PT Time Calculation (min) (ACUTE ONLY): 12 min   Charges:   PT Evaluation $PT Eval Moderate  Complexity: 1 Mod PT Treatments $Gait Training: 8-22 mins   PT G Codes:        08-26-2017  Donnella Sham, PT [phone]  (pager)  [name] 08-26-2017, 7:37 PM

## 2017-08-23 LAB — CBC WITH DIFFERENTIAL/PLATELET
BASOS PCT: 1 %
Basophils Absolute: 0.2 10*3/uL — ABNORMAL HIGH (ref 0.0–0.1)
EOS ABS: 0 10*3/uL (ref 0.0–0.7)
Eosinophils Relative: 0 %
HEMATOCRIT: 24.6 % — AB (ref 39.0–52.0)
Hemoglobin: 8.2 g/dL — ABNORMAL LOW (ref 13.0–17.0)
LYMPHS ABS: 1.2 10*3/uL (ref 0.7–4.0)
LYMPHS PCT: 7 %
MCH: 35.2 pg — AB (ref 26.0–34.0)
MCHC: 33.3 g/dL (ref 30.0–36.0)
MCV: 105.6 fL — AB (ref 78.0–100.0)
MONO ABS: 8.3 10*3/uL — AB (ref 0.1–1.0)
Monocytes Relative: 50 %
Neutro Abs: 7 10*3/uL (ref 1.7–7.7)
Neutrophils Relative %: 42 %
PLATELETS: 55 10*3/uL — AB (ref 150–400)
RBC: 2.33 MIL/uL — ABNORMAL LOW (ref 4.22–5.81)
RDW: ABNORMAL % (ref 11.5–15.5)
WBC: 16.7 10*3/uL — ABNORMAL HIGH (ref 4.0–10.5)

## 2017-08-23 LAB — GLUCOSE, CAPILLARY
GLUCOSE-CAPILLARY: 117 mg/dL — AB (ref 70–99)
Glucose-Capillary: 106 mg/dL — ABNORMAL HIGH (ref 70–99)
Glucose-Capillary: 113 mg/dL — ABNORMAL HIGH (ref 70–99)
Glucose-Capillary: 114 mg/dL — ABNORMAL HIGH (ref 70–99)
Glucose-Capillary: 115 mg/dL — ABNORMAL HIGH (ref 70–99)
Glucose-Capillary: 123 mg/dL — ABNORMAL HIGH (ref 70–99)

## 2017-08-23 LAB — BPAM PLATELET PHERESIS
BLOOD PRODUCT EXPIRATION DATE: 201907042359
ISSUE DATE / TIME: 201907040414
UNIT TYPE AND RH: 7300

## 2017-08-23 LAB — TYPE AND SCREEN
ABO/RH(D): O NEG
ANTIBODY SCREEN: NEGATIVE
Unit division: 0
Unit division: 0

## 2017-08-23 LAB — BPAM RBC
BLOOD PRODUCT EXPIRATION DATE: 201907162359
Blood Product Expiration Date: 201907232359
ISSUE DATE / TIME: 201907032327
ISSUE DATE / TIME: 201907040207
UNIT TYPE AND RH: 9500
Unit Type and Rh: 9500

## 2017-08-23 LAB — PATHOLOGIST SMEAR REVIEW

## 2017-08-23 LAB — PREPARE PLATELET PHERESIS: Unit division: 0

## 2017-08-23 NOTE — Progress Notes (Signed)
Occupational Therapy Treatment Patient Details Name: Reginald Daniels MRN: [id number] DOB: Oct 26, 1945 Today's Date: 08/23/2017    History of present illness 72 year old male with past medical history significant for mental retardation, prostate cancer, thrombocytopenia and myelodysplastic disorder presenting from home after fall.  CT showing SDH in the right medial fossa.   OT comments  Pt progressing toward OT goals.  He was able to ambulate to BR with min A using RW, with occasional LOB that required mod A to correct.  He requires max a for LB ADL.   Pt's caregiver present and reports that pt was independent with ADLs PTA, but is able to provide current level of assist at discharge, and he has all needed DME.  Will continue to follow.   Follow Up Recommendations  Home health OT;Supervision/Assistance - 24 hour    Equipment Recommendations  3 in 1 bedside commode;Wheelchair (measurements OT);Wheelchair cushion (measurements OT)    Recommendations for Other Services      Precautions / Restrictions Precautions Precautions: Fall       Mobility Bed Mobility                  Transfers Overall transfer level: Needs assistance Equipment used: Rolling walker (2 wheeled) Transfers: Sit to/from [facility] Sit to Stand: Min assist Stand pivot transfers: Min assist       General transfer comment: assist for balance and to advance Rt LE     Balance Overall balance assessment: Needs assistance Sitting-balance support: Feet supported;Single extremity supported Sitting balance-Leahy Scale: Poor Sitting balance - Comments: attempted to don socks EOB.  Required mod A to maintain EOB balance due to posterior and Lt LOB    Standing balance support: During functional activity;No upper extremity supported Standing balance-Leahy Scale: Poor Standing balance comment: Pt requires min A to perform grooming standing at sink                            ADL  either performed or assessed with clinical judgement   ADL Overall ADL's : Needs assistance/impaired Eating/Feeding: Maximal assistance;Sitting   Grooming: Wash/dry hands;Minimal assistance;Standing;Oral care Grooming Details (indicate cue type and reason): assist for balance, and cues for thoroughness              Lower Body Dressing: Maximal assistance;Sit to/from stand   Toilet Transfer: Moderate assistance;Ambulation;Comfort height toilet;RW;Grab bars Toilet Transfer Details (indicate cue type and reason): Pt requires min A to ambulate into bathroom with RW, but as he became anxious, he leand heavily back into therapist and required mod A to recover  Toileting- Clothing Manipulation and Hygiene: Minimal assistance;Sit to/from stand;Moderate assistance       Functional mobility during ADLs: Minimal assistance;Moderate assistance;Rolling walker       Vision       Perception     Praxis      Cognition Arousal/Alertness: Awake/alert Behavior During Therapy: WFL for tasks assessed/performed Overall Cognitive Status: History of cognitive impairments - at baseline                                 General Comments: Pt guardian reports that pt is at his baseline.  He does however, indicate that pt is more "whiney" than normal.          Exercises     Shoulder Instructions       General Comments  Guardian/caregiver present.  He states he feels confident with pt's current level, and will be able to provide necessary level of assist needed.  He reports he has a w/c at home, and pt can use w/c as needed for safety .  Alos has tub tranfser bench, RW     Pertinent Vitals/ Pain       Pain Assessment: No/denies pain  Home Living                                          Prior Functioning/Environment              Frequency  Min 2X/week        Progress Toward Goals  OT Goals(current goals can now be found in the care plan section)   Progress towards OT goals: Progressing toward goals     Plan Discharge plan remains appropriate    Co-evaluation                 AM-PAC PT "6 Clicks" Daily Activity     Outcome Measure   Help from another person eating meals?: A Lot Help from another person taking care of personal grooming?: A Little Help from another person toileting, which includes using toliet, bedpan, or urinal?: A Little Help from another person bathing (including washing, rinsing, drying)?: A Lot Help from another person to put on and taking off regular upper body clothing?: A Lot Help from another person to put on and taking off regular lower body clothing?: A Lot 6 Click Score: 14    End of Session Equipment Utilized During Treatment: Gait belt;Rolling walker  OT Visit Diagnosis: Unsteadiness on feet (R26.81)   Activity Tolerance Patient tolerated treatment well   Patient Left in chair;with call bell/phone within reach;with chair alarm set;with nursing/sitter in room;with family/visitor present   Nurse Communication Mobility status        Time: 1334-1420 OT Time Calculation (min): 46 min  Charges: OT General Charges $OT Visit: 1 Visit OT Treatments $Self Care/Home Management : 38-52 mins  [facility], OTR/L [phone]    Lucille Passy M 08/23/2017, 4:03 PM

## 2017-08-23 NOTE — Progress Notes (Signed)
  NEUROSURGERY PROGRESS NOTE   No issues overnight. Mild HA this am. No other complaints.  EXAM:  BP 120/64   Pulse 64   Temp 97.8 F (36.6 C) (Axillary)   Resp 13   Ht 5' (1.524 m)   Wt 58.1 kg (128 lb 1.4 oz)   SpO2 99%   BMI 25.02 kg/m   Awake, alert Speech fluent CN grossly intact  Symmetric strength   IMPRESSION:  72 y.o. male s/p fall at baseline neurologic exam with stable right SDH on f/u imaging. Thrombocytopenia  PLAN: - Cont to mobilize - Can f/u with Dr. Ronnald Ramp in outpatient clinic in 2 weeks

## 2017-08-23 NOTE — Progress Notes (Signed)
HEMATOLOGY/ONCOLOGY CLINIC NOTE  Date of Service: 08/26/17    Patient Care Team: Marletta Lor, MD as PCP - General  CHIEF COMPLAINTS/PURPOSE OF CONSULTATION:  F/u for MDS/PMF  HISTORY OF PRESENTING ILLNESS:   Reginald Daniels is a wonderful 72 y.o. male who has been referred to [place] by Dr .Burnice Logan, Doretha Sou, MD for evaluation and management of macrocytic anemia.  Patient has a h/o mental retardation (has a legal guardia -[name] who was present for visit), prostate cancer (managed by urology, s/p brachy RT with seeds), chronic macrocytosis with anemia and leukopenia.  He recently on 12/28/2015 has labs with his PCP including CBC which showed severe anemia with hgb of 7.8 MCV 123, nl PLT 185k, mild leucopenia 3.4k.  He has had significant macrocytosis since atleast 2013 but possibly longer/lifelong. However previously had hgb of 12.1 in 02/2013 and 13.2 in 05/2011. He has had wbc count of 2.7-3.4k in this time period.  Patient is a limited historian. Notes no significant dizziness or lightheadedness. Some fatigue. No weight loss. Night sweats. No bone pains. Denies any other new urinary symptoms. He is not reporting any other focal symptoms. He is legal guardian does not suggest any other complaints.  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 The patient's last visit with [place] was on 07/01/17. The pt reports that he is doing well overall.   The pt had a fall for which he presented to the ED on 08/21/17 and was evaluated for a subdural hematoma. He presents in clinic today with left arm and chin bruising. His legal guardian notes that he is having PT come to their home. The pt notes that he has headaches "once in a while." His legal guardian adds that the pt has been more sleep, taking more naps. The pt has a follow up  with neurosurgery for continued evaluation. The pt received a blood transfusion while in the hospital.   The pt has continued taking Vitamin B12 twice a day.   Lab results today (08/26/17) of CBC w/diff is as follows: all values are WNL except for WBC at 17.1k, RBC at 2.71, HGB at 9.6, HCT at 29.2, MCV at 107.7, MCH at 35.4, RDW at 29.5, PLT at 62k, ANC at 7.7k, Lymphs abs at 500, Monocytes abs at 7.0k, Basophils abs at 200.  On review of systems, pt reports some headaches, more tiredness, strong appetite, bruising, and denies abdominal pains, and any other symptoms.   MEDICAL HISTORY:  Past Medical History:  Diagnosis Date  . Cancer [facility])    prostate  . Leukopenia   . Macrocytosis   . Mental retardation   . Thrombocytopenia ([place])     SURGICAL HISTORY: status post seed implantation for prostate cancer  bilateral inguinal herniorrhaphies   SOCIAL HISTORY: Social History   Socioeconomic History  . Marital status: Married    Spouse name: Not on file  . Number of children: Not on file  . Years of education: Not on file  . Highest education level: Not on file  Occupational History  . Not on file  Social Needs  . Financial resource strain: Not on file  . Food insecurity:    Worry: Not on file    Inability: Not on file  . Transportation needs:    Medical: Not on file  Non-medical: Not on file  Tobacco Use  . Smoking status: Never Smoker  . Smokeless tobacco: Never Used  Substance and Sexual Activity  . Alcohol use: No  . Drug use: No  . Sexual activity: Not on file  Lifestyle  . Physical activity:    Days per week: Not on file    Minutes per session: Not on file  . Stress: Not on file  Relationships  . Social connections:    Talks on phone: Not on file    Gets together: Not on file    Attends religious service: Not on file    Active member of club or organization: Not on file    Attends meetings of clubs or organizations: Not on file    Relationship status: Not  on file  . Intimate partner violence:    Fear of current or ex partner: Not on file    Emotionally abused: Not on file    Physically abused: Not on file    Forced sexual activity: Not on file  Other Topics Concern  . Not on file  Social History Narrative  . Not on file  Never Smoked  patient has never been employed due to mental retardation  he lives with a court appointed guardian - [name] Smoking Status: never  FAMILY HISTORY:  father died at age 65  mother died young. A suicide death  No siblings   ALLERGIES:  has No Known Allergies.  MEDICATIONS:  Current Outpatient Medications  Medication Sig Dispense Refill  . acetaminophen (TYLENOL) 325 MG tablet Take 2 tablets (650 mg total) by mouth every 6 (six) hours as needed for mild pain, moderate pain or headache.    . b complex vitamins capsule Take 1 capsule by mouth daily.    . cetirizine (ZYRTEC) 10 MG tablet Take 10 mg by mouth daily.    . EPOETIN ALFA IJ Inject 1 each as directed every 14 (fourteen) days.    . magic mouthwash SOLN Take 5 mLs by mouth 3 (three) times daily. 50 mL 0   No current facility-administered medications for this visit.    Facility-Administered Medications Ordered in Other Visits  Medication Dose Route Frequency Provider Last Rate Last Dose  . Darbepoetin Alfa (ARANESP) injection 500 mcg  500 mcg Subcutaneous Once Irene Limbo, Cloria Spring, MD        REVIEW OF SYSTEMS:    A 10+ POINT REVIEW OF SYSTEMS WAS OBTAINED including neurology, dermatology, psychiatry, cardiac, respiratory, lymph, extremities, GI, GU, Musculoskeletal, constitutional, breasts, reproductive, HEENT.  All pertinent positives are noted in the HPI.  All others are negative.   PHYSICAL EXAMINATION:  ECOG PERFORMANCE STATUS: 1 - Symptomatic but completely ambulatory . Vitals:   08/26/17 1039  BP: (!) 141/62  Pulse: (!) 56  Resp: 18  Temp: 97.6 F (36.4 C)  SpO2: 100%   Filed Weights   08/26/17 1039  Weight: 126 lb  (57.2 kg)   .Body mass index is 24.61 kg/m. GENERAL:alert, in no acute distress and comfortable, appears pale SKIN: no acute rashes, bruising on RUE and chin EYES: conjunctiva w/ pallor, sclera anicteric OROPHARYNX: MMM, no exudates, no oropharyngeal erythema or ulceration NECK: supple, no JVD LYMPH:  no palpable lymphadenopathy in the cervical, axillary or inguinal regions LUNGS: clear to auscultation b/l with normal respiratory effort HEART: regular rate & rhythm ABDOMEN:  normoactive bowel sounds , non tender, not distended. No palpable hepatosplenomegaly.  Extremity: 1+ pedal edema PSYCH: alert, significant cognitive issues due to h/o  developmental disabilities  NEURO: no focal motor/sensory deficits   LABORATORY DATA:  I have reviewed the data as listed  .[name] CBC Latest Ref Rng & Units 08/26/2017 08/24/2017 08/23/2017  WBC 4.0 - 10.3 K/uL 17.1(H) 15.0(H) 16.7(H)  Hemoglobin 13.0 - 17.1 g/dL 9.6(L) 8.2(L) 8.2(L)  Hematocrit 38.4 - 49.9 % 29.2(L) 24.9(L) 24.6(L)  Platelets 140 - 400 K/uL 62(L) 63(L) 55(L)   . CBC    Component Value Date/Time   WBC 17.1 (H) 08/26/2017 1012   RBC 2.71 (L) 08/26/2017 1012   HGB 9.6 (L) 08/26/2017 1012   HGB 6.7 (LL) 06/28/2017 0815   HGB 7.6 (L) 02/22/2017 0812   HCT 29.2 (L) 08/26/2017 1012   HCT 23.7 (L) 02/22/2017 0812   PLT 62 (L) 08/26/2017 1012   PLT 70 (L) 06/28/2017 0815   PLT 50 (L) 02/22/2017 0812   MCV 107.7 (H) 08/26/2017 1012   MCV 121.5 (H) 02/22/2017 0812   MCH 35.4 (H) 08/26/2017 1012   MCHC 32.9 08/26/2017 1012   RDW 29.5 (H) 08/26/2017 1012   RDW 24.5 (H) 02/22/2017 0812   LYMPHSABS 0.5 (L) 08/26/2017 1012   LYMPHSABS 0.4 (L) 02/08/2017 0805   MONOABS 7.0 (H) 08/26/2017 1012   MONOABS 2.2 (H) 02/08/2017 0805   EOSABS 0.0 08/26/2017 1012   EOSABS 0.0 02/08/2017 0805   BASOSABS 0.2 (H) 08/26/2017 1012   BASOSABS 0.1 02/08/2017 0805      . CMP Latest Ref Rng & Units 08/22/2017 08/21/2017 07/16/2017  Glucose 70 - 99 mg/dL  92 98 83  BUN 8 - 23 mg/dL 14 15 20   Creatinine 0.61 - 1.24 mg/dL 1.46(H) 1.57(H) 1.49(H)  Sodium 135 - 145 mmol/L 142 139 139  Potassium 3.5 - 5.1 mmol/L 3.9 3.8 4.2  Chloride 98 - 111 mmol/L 117(H) 115(H) 110(H)  CO2 22 - 32 mmol/L 18(L) 20(L) 21(L)  Calcium 8.9 - 10.3 mg/dL 8.3(L) 8.2(L) 9.8  Total Protein 6.5 - 8.1 g/dL - 6.1(L) 7.2  Total Bilirubin 0.3 - 1.2 mg/dL - 1.2 0.7  Alkaline Phos 38 - 126 U/L - 48 59  AST 15 - 41 U/L - 32 17  ALT 0 - 44 U/L - 22 12   Component     Latest Ref Rng & Units 10/10/2016  Erythropoietin     2.6 - 18.5 mIU/mL 108.1 (H)             RADIOGRAPHIC STUDIES: I have personally reviewed the radiological images as listed and agreed with the findings in the report. Ct Head Wo Contrast  Result Date: 08/22/2017 CLINICAL DATA:  Followup subdural hematoma. EXAM: CT HEAD WITHOUT CONTRAST TECHNIQUE: Contiguous axial images were obtained from the base of the skull through the vertex without intravenous contrast. COMPARISON:  08/21/2017 FINDINGS: Brain: No increase in right convexity subdural hematoma when compared yesterday. Maximal thickness in the middle cranial fossa is 18 mm. Thickness in the right parietal region is 11 mm. Small component along the tentorium and falx is no larger. No midline shift. Small amount of blood dependent in the occipital horns slightly increased, indicating that there is at least some subarachnoid component. No sign of intraparenchymal hemorrhage. Ventricular size is stable with chronic ventriculomegaly. No sign acute parenchymal infarction or injury. Old small vessel infarctions of the cerebellum and cerebral hemispheric white matter. Vascular: There is atherosclerotic calcification of the major vessels at the base of the brain. Skull: No skull fracture. Sinuses/Orbits: Clear/normal Other: None IMPRESSION: No evidence of increasing subdural blood compared yesterday's  exam. Maximal thickness in the middle cranial fossa on the right  is 18 mm. Despite this, there is no midline shift. Slight increase in the amount blood layering dependently in the occipital horns of lateral ventricles, indicating that there is at least some subarachnoid component. Electronically Signed   By: Nelson Chimes M.D.   On: 08/22/2017 08:28   Ct Head Wo Contrast  Result Date: 08/21/2017 CLINICAL DATA:  Fall with laceration to the head EXAM: CT HEAD WITHOUT CONTRAST CT CERVICAL SPINE WITHOUT CONTRAST TECHNIQUE: Multidetector CT imaging of the head and cervical spine was performed following the standard protocol without intravenous contrast. Multiplanar CT image reconstructions of the cervical spine were also generated. COMPARISON:  None. FINDINGS: CT HEAD FINDINGS Brain: No acute territorial infarction or intracranial mass is visualized. Acute right subdural hematoma, measuring 18 mm maximum thickness in the right posterior temporal region. Convex margin of the hematoma along the right temporal convexity. Small amount of posterior inter hemispheric hemorrhage on the right. No significant midline shift. Localized mass effect on the right lateral ventricle. Small hematocrit level in the left lateral ventricle. Atrophy and small vessel ischemic changes of the white matter. The ventricular system is diffusely enlarged. Vascular: No hyperdense vessels.  Carotid vascular calcification. Skull: No fracture seen. Sinuses/Orbits: Mucosal thickening in the ethmoid sinuses. Other: None CT CERVICAL SPINE FINDINGS Alignment: Trace retrolisthesis C6 on C7. Facet alignment within normal limits. Skull base and vertebrae: Flattening of the occipital condyles without dislocation. No fracture Soft tissues and spinal canal: No prevertebral fluid or swelling. No visible canal hematoma. Disc levels: Fusion of C2-C3 and C4. Partial fusion of C5-C6. Marked degenerative change at C6-C7. Incomplete fusion anterior arch of C1 Upper chest: Lung apices are clear.  No thyroid mass Other: None  IMPRESSION: 1. Acute right hemispheric extra-axial hematoma measuring up to 18 mm in thickness. Although there is some convex margin of the blood collection at the posterior temporal lobe, suspect that hematoma is subdural. Small amount of posterior interhemispheric and right tentorial subdural hematoma. Small amount of left intraventricular hemorrhage. Localized mass effect on the right lateral ventricle but without significant midline shift. 2. Ventriculomegaly 3. 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 No acute fracture is seen. Critical Value/emergent results were called by telephone at the time of interpretation on 08/21/2017 at 8:12 pm to Dr. Margarita Mail , who verbally acknowledged these results. Electronically Signed   By: Donavan Foil M.D.   On: 08/21/2017 20:12   Ct Cervical Spine Wo Contrast  Result Date: 08/21/2017 CLINICAL DATA:  Fall with laceration to the head EXAM: CT HEAD WITHOUT CONTRAST CT CERVICAL SPINE WITHOUT CONTRAST TECHNIQUE: Multidetector CT imaging of the head and cervical spine was performed following the standard protocol without intravenous contrast. Multiplanar CT image reconstructions of the cervical spine were also generated. COMPARISON:  None. FINDINGS: CT HEAD FINDINGS Brain: No acute territorial infarction or intracranial mass is visualized. Acute right subdural hematoma, measuring 18 mm maximum thickness in the right posterior temporal region. Convex margin of the hematoma along the right temporal convexity. Small amount of posterior inter hemispheric hemorrhage on the right. No significant midline shift. Localized mass effect on the right lateral ventricle. Small hematocrit level in the left lateral ventricle. Atrophy and small vessel ischemic changes of the white matter. The ventricular system is diffusely enlarged. Vascular: No hyperdense vessels.  Carotid vascular calcification. Skull: No fracture seen. Sinuses/Orbits: Mucosal  thickening in the ethmoid sinuses. Other:  None CT CERVICAL SPINE FINDINGS Alignment: Trace retrolisthesis C6 on C7. Facet alignment within normal limits. Skull base and vertebrae: Flattening of the occipital condyles without dislocation. No fracture Soft tissues and spinal canal: No prevertebral fluid or swelling. No visible canal hematoma. Disc levels: Fusion of C2-C3 and C4. Partial fusion of C5-C6. Marked degenerative change at C6-C7. Incomplete fusion anterior arch of C1 Upper chest: Lung apices are clear.  No thyroid mass Other: None IMPRESSION: 1. Acute right hemispheric extra-axial hematoma measuring up to 18 mm in thickness. Although there is some convex margin of the blood collection at the posterior temporal lobe, suspect that hematoma is subdural. Small amount of posterior interhemispheric and right tentorial subdural hematoma. Small amount of left intraventricular hemorrhage. Localized mass effect on the right lateral ventricle but without significant midline shift. 2. Ventriculomegaly 3. 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 No acute fracture is seen. Critical Value/emergent results were called by telephone at the time of interpretation on 08/21/2017 at 8:12 pm to Dr. Margarita Mail , who verbally acknowledged these results. Electronically Signed   By: Donavan Foil M.D.   On: 08/21/2017 20:12   Dg Chest Port 1 View  Result Date: 08/22/2017 CLINICAL DATA:  [place] today.  Possible additional recent falls. EXAM: PORTABLE CHEST 1 VIEW COMPARISON:  03/22/2015 FINDINGS: Heart size is normal for technique. No vascular congestion, edema, or consolidation. No blunting of costophrenic angles. No pneumothorax. Mediastinal contours appear intact. Degenerative changes in the spine. IMPRESSION: No active disease. Electronically Signed   By: Lucienne Capers M.D.   On: 08/22/2017 00:13    ASSESSMENT & PLAN:   72 y.o. very pleasant gentleman with mental retardation here  with his legal guardian for evaluation of   1) Severe Macrocytic Anemia  Hemoglobin of 7.8 on 12/28/2015. Hemoglobin of 7.7 as of 10/26/16.  Previously, his Hgb appeared to be improving to 8.4 then to 8.9 and now up to 9.6 with improving MCV and RDW while on multivitamins and B complex. -Hgb at 10, MCV at 103.4 and RDW at 25.2 as of 11/30/2016 (s/p PRBC transfusion) -Hgb at 7.9, MCV at 34.3, and RDW at 27.6 as of 12/21/2016 -Hgb at 8.3 , MCV at 122.2, and RDW at 25.0 as of 03/22/17. -If ferritin level is less than 100 we will need to supplement iron. Ferritin labs pending 03/22/17 -BP today 03/22/17 is good, indicating pt continues to tolerate aranesp well  Multiple myeloma panel and serum free light chains within normal limits TSH within normal limits LDH and haptoglobin suggest no evidence of hemolysis B12 and calcium levels within normal limits. 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 Lab Results  Component Value Date   FERRITIN 250 03/22/2017   -Ferritin at 446 as of 11/30/2016  EPO levels 108  2) Leukopenia - WBC count .  WBC 2.9k, ANC 2k as of 10/10/16 WBC 3.2k as of 10/26/16. ANC 1.8k ANC today 1.6k (11/16/2016) WBC 2.8k as of 11/30/2016 WBC at 4.5k as of 12/21/2016 WBC at 6.0k as of 03/22/17 WBC at 6.3k as of 06/28/17  3) Thrombocytopenia  -PLT at 87 as of 10/26/2016. PLT at 55k on 11/15/2016 -PLT at 42k as of 11/30/2016  -PLT at 62 as of 12/21/2016  -PLT at 42k as of 03/22/17 PLT at70k as of 06/28/17 No evidence of  abnormal bruising or bleeding.  PLAN:  -increased Aranesp to546mg q2weeks for now to see if this might reduce his need for PRBC transfusions. -transfuse prbc for hgb<7 or if symptomatic -Patient and his guardian were recommended to call [place] they note increasing fatigue, dizziness, or DOE. -Discussed pt labwork today, 08/26/17; HGB increased to  9.6, PLT at 62k -Will continue transfusing as needed - no indication for blood transfusions currently -Will continue to order Aranesp injections every 2 weeks with labs   -continue labs q2weeks -Aranesp q2weeks -RTC with Dr KIrene Limboin 8 weeks   All of the patients questions were answered with apparent satisfaction. The patient knows to call the clinic with any problems, questions or concerns.  The total time spent in the appt was 20 minutes and more than 50% was on counseling and direct patient cares.    GSullivan LoneMD MBostonAAHIVMS SSelect Specialty Hospital - Winston SalemCWk Bossier Health CenterHematology/Oncology Physician CMemorial Hospital Miramar (Office):       [phone](Work cell):  [phone](Fax):           [phone] I, SBaldwin Jamaica am acting as a scribe for Dr KIrene Limbo   .I have reviewed the above documentation for accuracy and completeness, and I agree with the above. .Brunetta GeneraMD

## 2017-08-23 NOTE — Progress Notes (Signed)
PULMONARY / CRITICAL CARE MEDICINE   Name: Reginald Daniels MRN: [id number] DOB: 1945/04/19    ADMISSION DATE:  08/21/2017   CHIEF COMPLAINT:  Fall with SDH  HISTORY OF PRESENT ILLNESS:       72 year old male with past medical history significant for mental retardation, prostate cancer, thrombocytopenia and myelodysplastic disorder presenting from home after fall.  Patient lives with his legal guardian, [name].  He is followed by Dr. Irene Limbo with hematology.  States he ambulates at home with a walker but over the last month has had increasing falls.  He fell tonight hitting his head on a table and presented to ER for the possibility of needing sutures for small laceration between his eyebrows.  He denies loss of consciousness, headache, nausea or vomiting, bleeding, or complaints of pain.   In ER, he has been afebrile and hemodynamically stable and at baseline mental status.  Labs significant for WBC 18.7, Hgb 5.8 (previous transfusion goal > 7 or if symptomatic), Plt 48, sCr 1.57 (previously 1.49 in 07/16/17).  CT head noted for right subdural hematoma.  Neurosurgery evaluated with current plan for observation in the ICU. To be transfused PRBC and platelets   PCCM asked to admit.   He has no specific complaints today.  He does report some headache but says is better than on presentation.  He had an episode of nausea and vomiting last night but none since.  His caretaker reports his mental status to be at baseline.         PAST MEDICAL HISTORY :  He  has a past medical history of Cancer ([place]), Leukopenia, Macrocytosis, Mental retardation, and Thrombocytopenia ([place]).  PAST SURGICAL HISTORY: He  has no past surgical history on file.  No Known Allergies  No current facility-administered medications on file prior to encounter.    Current Outpatient Medications on File Prior to Encounter  Medication Sig  . b complex vitamins capsule Take 1 capsule by mouth daily.  . cetirizine  (ZYRTEC) 10 MG tablet Take 10 mg by mouth daily.  . EPOETIN ALFA IJ Inject 1 each as directed every 14 (fourteen) days.  . diphenoxylate-atropine (LOMOTIL) 2.5-0.025 MG tablet Take 1 tablet by mouth 4 (four) times daily as needed for diarrhea or loose stools. (Patient not taking: Reported on 08/21/2017)    FAMILY HISTORY:  His indicated that his mother is deceased. He indicated that his father is deceased.   SOCIAL HISTORY: He  reports that he has never smoked. He has never used smokeless tobacco. He reports that he does not drink alcohol or use drugs.     SUBJECTIVE:  As above  VITAL SIGNS: BP 129/65   Pulse 70   Temp 97.8 F (36.6 C) (Axillary)   Resp 18   Ht 5' (1.524 m)   Wt 128 lb 1.4 oz (58.1 kg)   SpO2 100%   BMI 25.02 kg/m   HEMODYNAMICS:    VENTILATOR SETTINGS:    INTAKE / OUTPUT: I/O last 3 completed shifts: In: 919.2 [Blood:919.2] Out: 950 [Urine:950]  PHYSICAL EXAMINATION: General: Pleasant, somewhat inappropriate and verbally interactive.  Neuro: Conversant, pupils equal, EOMs are full, face is symmetric, and he is moving all fours.   HEENT: Remarkable  torus Cardiovascular: S1 and S2 are regular without murmur rub or gallop.  There is no JVD.  Lungs: Abrasions are unlabored, there is symmetric air movement, no wheezes, no rhonchi.   Abdomen: Abdomen is flat and soft without organomegaly masses or  tenderness.     LABS:  BMET Recent Labs  Lab 08/21/17 2016 08/22/17 0719  NA 139 142  K 3.8 3.9  CL 115* 117*  CO2 20* 18*  BUN 15 14  CREATININE 1.57* 1.46*  GLUCOSE 98 92    Electrolytes Recent Labs  Lab 08/21/17 2016 08/22/17 0719  CALCIUM 8.2* 8.3*  MG  --  2.0  PHOS  --  3.8    CBC Recent Labs  Lab 08/21/17 2016 08/22/17 0719 08/23/17 0252  WBC 18.7* 18.2* 16.7*  HGB 5.8* 8.4* 8.2*  HCT 19.2* 25.9* 24.6*  PLT 48* 62* 55*    Coag's Recent Labs  Lab 08/21/17 2016 08/22/17 0719  INR 1.33 1.32    Sepsis  Markers Recent Labs  Lab 08/22/17 0719  PROCALCITON 0.36    ABG No results for input(s): PHART, PCO2ART, PO2ART in the last 168 hours.  Liver Enzymes Recent Labs  Lab 08/21/17 2016  AST 32  ALT 22  ALKPHOS 48  BILITOT 1.2  ALBUMIN 2.9*    Cardiac Enzymes No results for input(s): TROPONINI, PROBNP in the last 168 hours.  Glucose Recent Labs  Lab 08/22/17 1528 08/22/17 1953 08/22/17 2318 08/23/17 0330 08/23/17 0759 08/23/17 1141  GLUCAP 101* 140* 124* 115* 114* 113*    Imaging No results found.   STUDIES:  CT scan of the head this morning shows essentially no change from presentation  DISCUSSION:    This is a 72 year old with myelodysplastic syndrome and chronic thrombocytopenia who presented after a fall.  CT scan showed a subdural hematoma and he was admitted for treatment of thrombocytopenia and observation.  ASSESSMENT / PLAN:  PULMONARY A: No issues  CARDIOVASCULAR A: No issues  GASTROINTESTINAL A: He had a recent polypectomy and presented with a much lower than usual hemoglobin.  I will be repeating his CBC in the morning, occult blood is not yet been reported  HEMATOLOGIC.  He had a very marginal response to platelet transfusion.  Should he remain refractory to transfusion and have ongoing bleeding consideration will be given to the administration of Amicar.  NEUROLOGIC A: Subdural hematoma with stable neurologic exam.  I will continue to observe the patient in the hospital today as he is relatively thrombocytopenic and not one who would report subtle changes in neurological status.  Continue to monitor platelets.    Lars Masson, MD Critical Care Medicine [facility] Pager: [phone]  08/23/2017, 12:11 PM

## 2017-08-24 LAB — CBC WITH DIFFERENTIAL/PLATELET
Basophils Absolute: 0.2 10*3/uL — ABNORMAL HIGH (ref 0.0–0.1)
Basophils Relative: 1 %
EOS PCT: 0 %
Eosinophils Absolute: 0 10*3/uL (ref 0.0–0.7)
HEMATOCRIT: 24.9 % — AB (ref 39.0–52.0)
HEMOGLOBIN: 8.2 g/dL — AB (ref 13.0–17.0)
LYMPHS PCT: 7 %
Lymphs Abs: 1.1 10*3/uL (ref 0.7–4.0)
MCH: 35.3 pg — ABNORMAL HIGH (ref 26.0–34.0)
MCHC: 32.9 g/dL (ref 30.0–36.0)
MCV: 107.3 fL — AB (ref 78.0–100.0)
Monocytes Absolute: 7.2 10*3/uL — ABNORMAL HIGH (ref 0.1–1.0)
Monocytes Relative: 49 %
NEUTROS PCT: 43 %
Neutro Abs: 6.5 10*3/uL (ref 1.7–7.7)
Platelets: 63 10*3/uL — ABNORMAL LOW (ref 150–400)
RBC: 2.32 MIL/uL — ABNORMAL LOW (ref 4.22–5.81)
RDW: 30 % — ABNORMAL HIGH (ref 11.5–15.5)
WBC: 15 10*3/uL — ABNORMAL HIGH (ref 4.0–10.5)

## 2017-08-24 LAB — PROTIME-INR
INR: 1.23
Prothrombin Time: 15.4 seconds — ABNORMAL HIGH (ref 11.4–15.2)

## 2017-08-24 LAB — GLUCOSE, CAPILLARY
GLUCOSE-CAPILLARY: 105 mg/dL — AB (ref 70–99)
GLUCOSE-CAPILLARY: 124 mg/dL — AB (ref 70–99)

## 2017-08-24 MED ORDER — MAGIC MOUTHWASH: 5.0000 mL | Freq: Three times a day (TID) | ORAL | 0 refills | Status: AC

## 2017-08-24 MED ORDER — ACETAMINOPHEN 325 MG PO TABS: 650.0000 mg | ORAL_TABLET | Freq: Four times a day (QID) | ORAL | Status: AC | PRN

## 2017-08-24 MED ORDER — TRAMADOL HCL 50 MG PO TABS
50.0000 mg | ORAL_TABLET | Freq: Once | ORAL | Status: AC
  Administered 2017-08-24: 50 mg via ORAL
  Filled 2017-08-24: qty 1

## 2017-08-24 MED ORDER — MAGIC MOUTHWASH
5.0000 mL | Freq: Three times a day (TID) | ORAL | Status: DC
  Administered 2017-08-24: 5 mL via ORAL
  Filled 2017-08-24: qty 5

## 2017-08-24 NOTE — Discharge Summary (Addendum)
Physician Discharge Summary  Patient ID: Reginald Daniels MRN: [id number] DOB/AGE: 1945-10-20 72 y.o.  Admit date: 08/21/2017 Discharge date: 08/24/2017  Problem List Principal Problem:   Subdural hematoma, post-traumatic (HCC) Active Problems:   MENTAL RETARDATION   Macrocytic anemia   MDS (myelodysplastic syndrome) (HCC)   Intraventricular hemorrhage (HCC)   Klippel-Feil deformity   Leukocytosis  HPI: 72 year old male with past medical history significant for mental retardation, prostate cancer, thrombocytopenia and myelodysplastic disorder presenting from home after fall.  Patient lives with his legal guardian, [name].  He is followed by Dr. Irene Limbo with hematology.  States he ambulates at home with a walker but over the last month has had increasing falls.  He fell tonight hitting his head on a table and presented to ER for the possibility of needing sutures for small laceration between his eyebrows.  He denies loss of consciousness, headache, nausea or vomiting, bleeding, or complaints of pain.   In ER, he has been afebrile and hemodynamically stable and at baseline mental status.  Labs significant for WBC 18.7, Hgb 5.8 (previous transfusion goal > 7 or if symptomatic), Plt 48, sCr 1.57 (previously 1.49 in 07/16/17).  CT head noted for right subdural hematoma.  Neurosurgery evaluated with current plan for observation in the ICU. To be transfused PRBC and platelets   PCCM asked to admit.    Hospital Course:  Per NS  IMPRESSION:  72 y.o. male s/p fall at baseline neurologic exam with stable right SDH on f/u imaging. Thrombocytopenia  PLAN: - Cont to mobilize - Can f/u with Dr. Ronnald Ramp in outpatient clinic in 2 weeks  Per CCM 72 year old myelodysplastic syndrome and chronic thrombocytopenia who presented after a fall and CT scan showing subdural hematoma he is admitted for further evaluation and treatment. Note he was evaluated by neurosurgery and was not felt to need  surgical interventions.  He has been monitored in the hospital is reached maximal hospital benefit on 08/24/2017 is ready for discharge home. Note he has mild mental retardation he has a legal guardian his brother who manages his care and has been informed of his discharge from today's date. Patient Active Problem List   Diagnosis Date Noted  . Subdural hematoma, post-traumatic ([place]) 08/21/2017  . Intraventricular hemorrhage ([place]) 08/21/2017  . 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 Leukocytosis 08/21/2017  . MDS (myelodysplastic syndrome) ([place]) 10/26/2016  . Macrocytic anemia 02/22/2010  . Leukopenia 02/22/2010  . 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 MENTAL RETARDATION 11/22/2009  [place]  Plan is to have him discharged home today 08/24/2017 and to follow-up with neurosurgery within 2 weeks.  Phone number for the neurosurgeon will be included on his discharge summary sheet with him to call and make an appointment since this is a weekend.  His thrombocytopenia remains stable at this time.  Mental retardation he has a legal guardian who cares for him who is aware of his discharge today.  Note we will order home health RN and physical therapy for him on discharge. Magic mouthwash ordered.   Labs at discharge Lab Results  Component Value Date   CREATININE 1.46 (H) 08/22/2017   BUN 14 08/22/2017   NA 142 08/22/2017   K 3.9 08/22/2017   CL 117 (H) 08/22/2017   CO2 18 (L) 08/22/2017   Lab Results  Component Value Date   WBC 15.0 (H) 08/24/2017   HGB 8.2 (L) 08/24/2017   HCT 24.9 (L) 08/24/2017   MCV 107.3 (H) 08/24/2017   PLT 63 (L)  08/24/2017   Lab Results  Component Value Date   ALT 22 08/21/2017   AST 32 08/21/2017   ALKPHOS 48 08/21/2017   BILITOT 1.2 08/21/2017   Lab Results  Component Value Date   INR 1.23 08/24/2017   INR 1.32 08/22/2017   INR 1.33 08/21/2017    Current radiology studies No results found.  Disposition:  Discharge disposition: 01-Home or Self  Care        Allergies as of 08/24/2017   No Known Allergies     Medication List    TAKE these medications   acetaminophen 325 MG tablet Commonly known as:  TYLENOL Take 2 tablets (650 mg total) by mouth every 6 (six) hours as needed for mild pain, moderate pain or headache.   b complex vitamins capsule Take 1 capsule by mouth daily.   cetirizine 10 MG tablet Commonly known as:  ZYRTEC Take 10 mg by mouth daily.   diphenoxylate-atropine 2.5-0.025 MG tablet Commonly known as:  LOMOTIL Take 1 tablet by mouth 4 (four) times daily as needed for diarrhea or loose stools.   EPOETIN ALFA IJ Inject 1 each as directed every 14 (fourteen) days.   magic mouthwash Soln Take 5 mLs by mouth 3 (three) times daily.      Follow-up Information    Eustace Moore, MD Follow up in 2 week(s).   Specialty:  Neurosurgery Contact information: [address] [place] [address] [phone]            Discharged Condition: fair  Time spent on discharge greater than 40 minutes.  Vital signs at Discharge. Temp:  [97.6 F (36.4 C)-98.6 F (37 C)] 97.6 F (36.4 C) (07/06 0754) Pulse Rate:  [58-70] 63 (07/06 0900) Resp:  [11-18] 12 (07/06 0900) BP: (118-145)/(56-71) 125/61 (07/06 0900) SpO2:  [97 %-100 %] 99 % (07/06 0900) Weight:  [123 lb 14.4 oz (56.2 kg)] 123 lb 14.4 oz (56.2 kg) (07/06 0300) Office follow up Special Information or instructions.  Follow up with neurosurgery.  Signed: Richardson Landry Minor ACNP Maryanna Shape PCCM Pager [phone] till 1 pm If no answer page [phone] 08/24/2017, 10:52 AM

## 2017-08-24 NOTE — Care Management Note (Signed)
Case Management Note  Patient Details  Name: Reginald Daniels MRN: [id number] Date of Birth: October 05, 1945  Subjective/Objective:   72 yr old gentleman admitted after a fall at home with a stable subdural hematoma and thrombocytopenia.                   Action/Plan: Case manager called patient's legal guardian- Reginald Daniels, to discuss Reginald Daniels needs. Choice for Home Health agency was offered, referral was called to Reginald Daniels, Reginald Daniels. There are no DME needs.    Expected Discharge Date:  08/24/17               Expected Discharge Plan:  Reginald Daniels  In-House Referral:  NA  Discharge planning Services  CM Consult  Post Acute Care Choice:  Home Health Choice offered to:  [employer] / Guardian(Reginald Daniels- legal guardian)  DME Arranged:  N/A DME Agency:  NA  HH Arranged:  PT, OT HH Agency:  Reginald Daniels  Status of Service:  Completed, signed off  If discussed at [place] of Stay Meetings, dates discussed:    Additional Comments:  Reginald Meeker, RN 08/24/2017, 12:11 PM

## 2017-08-24 NOTE — Progress Notes (Signed)
Patient discharged home via w/c to car with his legal guardian. Prescription for Magic Mouthwash and signed discharge instructions sent with patient's legal guardian.

## 2017-08-24 NOTE — Discharge Instructions (Signed)
Subdural Hematoma °A subdural hematoma is a collection of blood between the brain and its tough outer covering (dura). As the amount of blood increases, it puts pressure on the brain. °There are two types of subdural hematomas: °· Acute. This type develops shortly after a hard, direct hit (blow) to the head and causes blood to collect very quickly. This is a medical emergency. If it is not diagnosed and treated quickly, it can lead to severe brain injury or death. °· Chronic. This is when bleeding develops more slowly, over weeks or months. ° °What are the causes? °This condition is caused by bleeding (hemorrhage) from a broken (ruptured) blood vessel. In most cases, a blood vessel ruptures and bleeds because of injury (trauma) to the head, such as from a hard, direct hit. Head trauma can happen in: °· Traffic accidents. °· Falls. °· Assaults. °· Sport injuries. ° °In rare cases, hemorrhage can happen without a known cause (spontaneously), especially if you take blood thinners (anticoagulants). °What increases the risk? °This condition is more likely to develop in: °· Older people. °· Infants. °· People who take blood thinners. °· People who have injured their head. °· People who abuse alcohol. ° °What are the signs or symptoms? °Depending on the size of the hematoma, symptoms can vary from mild to severe and life-threatening. Symptoms in acute subdural hematoma can develop over minutes or hours. Symptoms in chronic subdural hematoma may develop over weeks or months. °· Headaches. °· Nausea or vomiting. °· Changes in vision, such as double vision or loss of vision. °· Changes in speech. °· Loss of balance or difficulty walking. °· Weakness, numbness, or tingling in the arms or legs on one side of the body. °· Jerky movements that you cannot control (seizures). °· Change in personality. °· Increased sleepiness. °· Memory loss. °· Loss of consciousness. °· Coma. ° °How is this diagnosed? °This condition is diagnosed  based on the results of: °· A physical and neurological exam. °· CT scan. °· MRI. ° °How is this treated? °Treatment for this condition depends on the severity and the type of subdural hematoma that you have. You may need to temporarily stop taking blood thinners, if this applies. You may be given antiseizure (anticonvulsant) medicine. °Treatment for acute subdural hematoma may include: °· Medicines that help the body get rid of excess fluids (diuretics). These may help reduce pressure in the brain. °· Assisted breathing (ventilation). This involves using a machine called a ventilator to help you breathe. This helps to reduce pressure in the brain, especially if there is swelling of the brain. °· Emergency surgery to drain blood or remove a blood clot. ° °Treatment for chronic subdural hematoma may include: °· Observation and bed rest at the hospital. °· Emergency surgery. This may be done if the bleeding is large, or if you have neurological symptoms such as weakness or numbness. ° °Sometimes, no treatment is needed for chronic subdural hematoma. °Follow these instructions at home: °Activity °· Avoid any situation where there is potential for another head injury, such as football, hockey, soccer, basketball, martial arts, downhill snow sports, and horseback riding. Do not do these activities until your health care provider approves. °? If you play a contact sport and you experience a head injury, follow advice from your health care provider about when you can return to the sport. If you get another injury while you are healing, you may experience another hemorrhage. °· Avoid excessive visual stimulation while recovering. This includes working   on the computer, watching TV, and reading. °· Try to avoid activities that cause physical or mental stress. Stay home from work or school as directed by your health care provider. °· Do not drive, ride a bicycle, or use heavy machinery until your health care provider  approves. °· Do not lift anything that is heavier than 5 lb (2.3 kg) until your health care provider approves. °· If physical therapy was prescribed, do exercises as told by your health care provider or physical therapist. °· Rest as told by your health care provider. Rest helps the brain to heal. °· Make sure you: °? Get plenty of sleep. Avoid staying up late at night. °? Keep a consistent sleep schedule. Try to go to sleep and wake up at about the same time every day. °General instructions °· Recovery from brain injuries varies widely. Talk with your health care provider about what to expect. Monitor your symptoms, and ask people around you to do the same. °· Take over-the-counter and prescription medicines only as told by your health care provider. Do not take blood thinners or NSAIDs unless your health care provider approves. This includes aspirin, ibuprofen, naproxen, and warfarin. °· Limit alcohol intake to no more than 1 drink per day for nonpregnant women and 2 drinks per day for men. One drink equals 12 oz of beer, 5 oz of wine, or 1½ oz of hard liquor. °· Keep all follow-up visits as told by your health care provider. This is important. °How is this prevented? °· Wear protective gear, such as helmets, when participating in activities such as biking or contact sports. °· Always wear a seat belt when you are in a motor vehicle. °· Keep your home environment safe to reduce the risk of falling: °? Remove clutter and tripping hazards from floors and stairways, such as loose rugs and extension cords. °? Use grab bars in bathrooms and handrails by stairs. °? Place non-slip mats on floors and in bathtubs. °? Improve lighting in dim areas. °Where to find more information: °· National Institute of Neurological Disorders and Stroke: www.ninds.nih.gov °· American Association of Neurological Surgeons: http://www.aans.org °· American Academy of Neurology (AAN): www.aan.com °· Brain Injury Association of America:  www.biausa.org °Get help right away if: °· You develop symptoms of subdural hematoma. °· You are taking blood thinners and you fall or you experience minor trauma to the head. If you take any blood thinners, even a very small injury can cause a subdural hematoma. You should get help right away, even if you think your symptoms are mild. °· You have a bleeding disorder and you fall or you experience minor trauma to the head. °· You experience a head injury and you develop any of the following symptoms: °? Clear fluid draining from your nose or ears. °? Nausea. °? Vomiting. °? Slurred speech. °? Seizures. °? Drowsiness or a decrease in alertness. °? Double vision. °? Numbness or inability to move (paralysis) in any part of your body. °? Difficulty walking or poor coordination. °? Difficulty thinking. °? Confusion or forgetfulness. °? Personality changes. °? Irrational or aggressive behavior. °? A history of heavy alcohol use. °These symptoms may represent a serious problem that is an emergency. Do not wait to see if the symptoms will go away. Get medical help right away. Call your local emergency services (911 in the [place] Do not drive yourself to the hospital. °Summary °· A subdural hematoma is a collection of blood between the brain and its tough outer covering. °·   Treatment for this condition depends on the severity and the type of subdural hemorrhage that you have. °· Symptoms can vary from mild to severe and life-threatening. °· Monitor your symptoms, and ask others around you to do the same. °This information is not intended to replace advice given to you by your health care provider. Make sure you discuss any questions you have with your health care provider. °Document Released: 12/24/2003 Document Revised: 01/11/2016 Document Reviewed: 01/11/2016 °Elsevier Interactive Patient Education © 2018 Elsevier Inc. ° °

## 2017-08-26 ENCOUNTER — Inpatient Hospital Stay: Payer: Medicare Other

## 2017-08-26 ENCOUNTER — Telehealth: Payer: Self-pay

## 2017-08-26 ENCOUNTER — Inpatient Hospital Stay: Payer: Medicare Other | Attending: Hematology | Admitting: Hematology

## 2017-08-26 ENCOUNTER — Encounter: Payer: Self-pay | Admitting: Hematology

## 2017-08-26 ENCOUNTER — Telehealth: Payer: Self-pay | Admitting: Internal Medicine

## 2017-08-26 VITALS — BP 141/62 | HR 56 | Temp 97.6°F | Resp 18 | Ht 60.0 in | Wt 126.0 lb

## 2017-08-26 DIAGNOSIS — Z9181 History of falling: Secondary | ICD-10-CM | POA: Insufficient documentation

## 2017-08-26 DIAGNOSIS — D469 Myelodysplastic syndrome, unspecified: Secondary | ICD-10-CM | POA: Insufficient documentation

## 2017-08-26 DIAGNOSIS — D72819 Decreased white blood cell count, unspecified: Secondary | ICD-10-CM | POA: Diagnosis not present

## 2017-08-26 DIAGNOSIS — D539 Nutritional anemia, unspecified: Secondary | ICD-10-CM | POA: Insufficient documentation

## 2017-08-26 LAB — CBC WITH DIFFERENTIAL/PLATELET
BASOS ABS: 0.2 10*3/uL — AB (ref 0.0–0.1)
BASOS PCT: 1 %
Band Neutrophils: 3 %
Blasts: 10 %
EOS PCT: 0 %
Eosinophils Absolute: 0 10*3/uL (ref 0.0–0.5)
HEMATOCRIT: 29.2 % — AB (ref 38.4–49.9)
Hemoglobin: 9.6 g/dL — ABNORMAL LOW (ref 13.0–17.1)
Lymphocytes Relative: 3 %
Lymphs Abs: 0.5 10*3/uL — ABNORMAL LOW (ref 0.9–3.3)
MCH: 35.4 pg — ABNORMAL HIGH (ref 27.2–33.4)
MCHC: 32.9 g/dL (ref 32.0–36.0)
MCV: 107.7 fL — AB (ref 79.3–98.0)
METAMYELOCYTES PCT: 1 %
MYELOCYTES: 1 %
Monocytes Absolute: 7 10*3/uL — ABNORMAL HIGH (ref 0.1–0.9)
Monocytes Relative: 41 %
NEUTROS ABS: 7.7 10*3/uL — AB (ref 1.5–6.5)
NRBC: 4 /100{WBCs} — AB
Neutrophils Relative %: 40 %
OTHER: 0 %
PROMYELOCYTES RELATIVE: 0 %
Platelets: 62 10*3/uL — ABNORMAL LOW (ref 140–400)
RBC: 2.71 MIL/uL — AB (ref 4.20–5.82)
RDW: 29.5 % — AB (ref 11.0–14.6)
WBC: 17.1 10*3/uL — ABNORMAL HIGH (ref 4.0–10.3)

## 2017-08-26 MED ORDER — DARBEPOETIN ALFA 500 MCG/ML IJ SOSY
500.0000 ug | PREFILLED_SYRINGE | Freq: Once | INTRAMUSCULAR | Status: AC
  Administered 2017-08-26: 500 ug via SUBCUTANEOUS

## 2017-08-26 MED ORDER — DARBEPOETIN ALFA 500 MCG/ML IJ SOSY
PREFILLED_SYRINGE | INTRAMUSCULAR | Status: AC
  Filled 2017-08-26: qty 1

## 2017-08-26 NOTE — Patient Instructions (Signed)

## 2017-08-26 NOTE — Telephone Encounter (Signed)
Unable to reach patient at time of TCM Call.  Left message for patient to return call when available.   Admit date: 08/21/2017 Discharge date: 08/24/2017 (Limited information regarding D/C on Summary)

## 2017-08-26 NOTE — Telephone Encounter (Signed)
Per 7/8 called patient and left a detailed message concerning upcoming appointment. Mailed a calender, with a letter enclosed to verify date and time

## 2017-08-27 DIAGNOSIS — D649 Anemia, unspecified: Secondary | ICD-10-CM | POA: Diagnosis not present

## 2017-08-27 DIAGNOSIS — D469 Myelodysplastic syndrome, unspecified: Secondary | ICD-10-CM | POA: Diagnosis not present

## 2017-08-27 DIAGNOSIS — Q761 Klippel-Feil syndrome: Secondary | ICD-10-CM | POA: Diagnosis not present

## 2017-08-27 DIAGNOSIS — W01118D Fall on same level from slipping, tripping and stumbling with subsequent striking against other sharp object, subsequent encounter: Secondary | ICD-10-CM | POA: Diagnosis not present

## 2017-08-27 DIAGNOSIS — D696 Thrombocytopenia, unspecified: Secondary | ICD-10-CM | POA: Diagnosis not present

## 2017-08-27 DIAGNOSIS — S065X0D Traumatic subdural hemorrhage without loss of consciousness, subsequent encounter: Secondary | ICD-10-CM | POA: Diagnosis not present

## 2017-08-27 LAB — CULTURE, BLOOD (ROUTINE X 2)
CULTURE: NO GROWTH
CULTURE: NO GROWTH

## 2017-08-27 NOTE — Telephone Encounter (Signed)
  Transition Care Management Follow-up Telephone Call  Date discharged? 08/24/17   How have you been since you were released from the hospital? Spoke with Guardian [name], states that the patient is not feeling well. I spoke briefly with patient and states "I feel a little weird but that is normal" "I dont feel good at all"   Do you understand why you were in the hospital? yes, "I fell and hit my head on the table in my room and I do not feel good at all since being home"   Do you understand the discharge instructions? yes   Where were you discharged to? Home with [name] (Guardian)   Items Reviewed:  Medications reviewed: yes  Allergies reviewed: yes  Dietary changes reviewed: yes  Referrals reviewed: yes   Functional Questionnaire:   Activities of Daily Living (ADLs):   He states they are independent in the following: n/a States they require assistance with the following: ambulation, bathing and hygiene, feeding, continence, grooming, toileting, dressing and Guardian is having to assist with everything. Pt is sleeping a lot.    Any transportation issues/concerns?: no   Any patient concerns? Only concern is area where venipuncture was done, area is very red, inflamed and sore. [name] wants to make sure site is not infected.    Confirmed importance and date/time of follow-up visits scheduled yes  Provider Appointment booked with Dr Burnice Logan 08/28/17 at 3:15p  Confirmed with patient if condition begins to worsen call PCP or go to the ER.  Patient was given the office number and encouraged to call back with question or concerns.  : yes

## 2017-08-28 ENCOUNTER — Ambulatory Visit (INDEPENDENT_AMBULATORY_CARE_PROVIDER_SITE_OTHER): Payer: Medicare Other | Admitting: Internal Medicine

## 2017-08-28 ENCOUNTER — Encounter: Payer: Self-pay | Admitting: Internal Medicine

## 2017-08-28 VITALS — BP 120/48 | HR 67 | Temp 98.7°F

## 2017-08-28 DIAGNOSIS — D469 Myelodysplastic syndrome, unspecified: Secondary | ICD-10-CM

## 2017-08-28 DIAGNOSIS — I615 Nontraumatic intracerebral hemorrhage, intraventricular: Secondary | ICD-10-CM | POA: Diagnosis not present

## 2017-08-28 DIAGNOSIS — S065X0D Traumatic subdural hemorrhage without loss of consciousness, subsequent encounter: Secondary | ICD-10-CM

## 2017-08-28 DIAGNOSIS — D539 Nutritional anemia, unspecified: Secondary | ICD-10-CM

## 2017-08-28 DIAGNOSIS — Q761 Klippel-Feil syndrome: Secondary | ICD-10-CM

## 2017-08-28 DIAGNOSIS — D72829 Elevated white blood cell count, unspecified: Secondary | ICD-10-CM

## 2017-08-28 NOTE — Patient Instructions (Signed)
Home physical therapy as scheduled  Neurosurgery follow-up as scheduled  Follow-up with hematology  Return here in 6 months or as needed

## 2017-08-28 NOTE — Progress Notes (Signed)
   Subjective:    Patient ID: Reginald Daniels, male    DOB: 1945/05/12, 72 y.o.   MRN: [id number]  HPI    Review of Systems     Objective:   Physical Exam        Assessment & Plan:

## 2017-08-28 NOTE — Progress Notes (Signed)
Subjective:    Patient ID: Reginald Daniels, male    DOB: 1945/09/09, 72 y.o.   MRN: [id number]  HPI  72 year old patient who is seen today following a recent hospital discharge and for transitional care management  Admit date: 08/21/2017 Discharge date: 08/24/2017  Problem List Principal Problem:   Subdural hematoma, post-traumatic (HCC) Active Problems:   MENTAL RETARDATION   Macrocytic anemia   MDS (myelodysplastic syndrome) (HCC)   Intraventricular hemorrhage (HCC)   Klippel-Feil deformity   Leukocytosis  Patient was discharged from the hospital 4 days ago and is accompanied today by caregiver, [name]. Since his discharge she has done fairly well.  He does have a walker at home and home physical therapy has been scheduled.  He has seen Dr. Irene Limbo in follow-up and his anemia is much improved at greater than 9 g%.  Hospital records reviewed.  The patient is scheduled for neurosurgery follow-up.  There is been no change since his hospital discharge and no focal neurological symptoms.  He remains slightly weak with some hypersomnolence.  Past Medical History:  Diagnosis Date  . Cancer [facility])    prostate  . Leukopenia   . Macrocytosis   . Mental retardation   . Thrombocytopenia ([place])      Social History   Socioeconomic History  . Marital status: Married    Spouse name: Not on file  . Number of children: Not on file  . Years of education: Not on file  . Highest education level: Not on file  Occupational History  . Not on file  Social Needs  . Financial resource strain: Not on file  . Food insecurity:    Worry: Not on file    Inability: Not on file  . Transportation needs:    Medical: Not on file    Non-medical: Not on file  Tobacco Use  . Smoking status: Never Smoker  . Smokeless tobacco: Never Used  Substance and Sexual Activity  . Alcohol use: No  . Drug use: No  . Sexual activity: Not on file  Lifestyle  . Physical activity:    Days per week: Not  on file    Minutes per session: Not on file  . Stress: Not on file  Relationships  . Social connections:    Talks on phone: Not on file    Gets together: Not on file    Attends religious service: Not on file    Active member of club or organization: Not on file    Attends meetings of clubs or organizations: Not on file    Relationship status: Not on file  . Intimate partner violence:    Fear of current or ex partner: Not on file    Emotionally abused: Not on file    Physically abused: Not on file    Forced sexual activity: Not on file  Other Topics Concern  . Not on file  Social History Narrative  . Not on file    History reviewed. No pertinent surgical history.  History reviewed. No pertinent family history.  No Known Allergies  Current Outpatient Medications on File Prior to Visit  Medication Sig Dispense Refill  . acetaminophen (TYLENOL) 325 MG tablet Take 2 tablets (650 mg total) by mouth every 6 (six) hours as needed for mild pain, moderate pain or headache.    . b complex vitamins capsule Take 1 capsule by mouth daily.    . cetirizine (ZYRTEC) 10 MG tablet Take 10 mg by mouth daily.    [name]  EPOETIN ALFA IJ Inject 1 each as directed every 14 (fourteen) days.    . magic mouthwash SOLN Take 5 mLs by mouth 3 (three) times daily. 50 mL 0   No current facility-administered medications on file prior to visit.     BP (!) 120/48 (BP Location: Right Arm, Patient Position: Sitting, Cuff Size: Normal)   Pulse 67   Temp 98.7 F (37.1 C) (Oral)   SpO2 98%      Review of Systems  Constitutional: Positive for activity change, appetite change and fatigue. Negative for chills and fever.  HENT: Negative for congestion, dental problem, ear pain, hearing loss, sore throat, tinnitus, trouble swallowing and voice change.   Eyes: Negative for pain, discharge and visual disturbance.  Respiratory: Negative for cough, chest tightness, wheezing and stridor.   Cardiovascular: Negative for  chest pain, palpitations and leg swelling.  Gastrointestinal: Negative for abdominal distention, abdominal pain, blood in stool, constipation, diarrhea, nausea and vomiting.  Genitourinary: Negative for difficulty urinating, discharge, flank pain, genital sores, hematuria and urgency.  Musculoskeletal: Negative for arthralgias, back pain, gait problem, joint swelling, myalgias and neck stiffness.  Skin: Negative for rash.  Neurological: Negative for dizziness, syncope, speech difficulty, weakness, numbness and headaches.  Hematological: Negative for adenopathy. Does not bruise/bleed easily.  Psychiatric/Behavioral: Positive for sleep disturbance. Negative for behavioral problems and dysphoric mood. The patient is not nervous/anxious.        Objective:   Physical Exam  Constitutional: He is oriented to person, place, and time. He appears well-developed.  Sitting in wheelchair Alert Blood pressure low normal  HENT:  Head: Normocephalic.  Right Ear: External ear normal.  Left Ear: External ear normal.  Eyes: Conjunctivae and EOM are normal.  Neck: Normal range of motion.  Cardiovascular: Normal rate and normal heart sounds.  Pulmonary/Chest: Breath sounds normal.  Abdominal: Bowel sounds are normal.  Musculoskeletal: Normal range of motion. He exhibits no edema or tenderness.  Neurological: He is alert and oriented to person, place, and time.  Suggestion of some mild right lower leg weakness  Psychiatric: He has a normal mood and affect. His behavior is normal.          Assessment & Plan:   History of right sided subdural hematoma.  Neurosurgical follow-up as scheduled.  Patient to initiate home physical therapy.  Handicap parking placard signed Mental retardation History of myelodysplastic syndrome with anemia and thrombocytopenia.  Anemia improved status post transfusion.  Close hematology follow-up  Follow-up here in 4 to 6 months with a new provider  Marletta Lor

## 2017-08-29 ENCOUNTER — Telehealth: Payer: Self-pay | Admitting: Internal Medicine

## 2017-08-29 DIAGNOSIS — D469 Myelodysplastic syndrome, unspecified: Secondary | ICD-10-CM | POA: Diagnosis not present

## 2017-08-29 DIAGNOSIS — W01118D Fall on same level from slipping, tripping and stumbling with subsequent striking against other sharp object, subsequent encounter: Secondary | ICD-10-CM | POA: Diagnosis not present

## 2017-08-29 DIAGNOSIS — S065X0D Traumatic subdural hemorrhage without loss of consciousness, subsequent encounter: Secondary | ICD-10-CM | POA: Diagnosis not present

## 2017-08-29 DIAGNOSIS — D696 Thrombocytopenia, unspecified: Secondary | ICD-10-CM | POA: Diagnosis not present

## 2017-08-29 DIAGNOSIS — D649 Anemia, unspecified: Secondary | ICD-10-CM | POA: Diagnosis not present

## 2017-08-29 DIAGNOSIS — Q761 Klippel-Feil syndrome: Secondary | ICD-10-CM | POA: Diagnosis not present

## 2017-08-29 NOTE — Telephone Encounter (Signed)
Verbal orders given as directed. 

## 2017-08-29 NOTE — Telephone Encounter (Signed)
Copied from [place] [phone]. Topic: Inquiry >> Aug 29, 2017 11:24 AM Lennox Solders wrote: Reason for CRM: [facility] is calling needing verbal order for home physical therapy for one to three times a wk for 5 wks. Pt had a fall about a week ago. Pt had subdural hematoma on his head

## 2017-08-29 NOTE — Telephone Encounter (Signed)
Okay for verbal orders. 

## 2017-08-29 NOTE — Telephone Encounter (Signed)
Okay for home PT

## 2017-09-02 DIAGNOSIS — D469 Myelodysplastic syndrome, unspecified: Secondary | ICD-10-CM | POA: Diagnosis not present

## 2017-09-02 DIAGNOSIS — D649 Anemia, unspecified: Secondary | ICD-10-CM | POA: Diagnosis not present

## 2017-09-02 DIAGNOSIS — Q761 Klippel-Feil syndrome: Secondary | ICD-10-CM | POA: Diagnosis not present

## 2017-09-02 DIAGNOSIS — S065X0D Traumatic subdural hemorrhage without loss of consciousness, subsequent encounter: Secondary | ICD-10-CM | POA: Diagnosis not present

## 2017-09-02 DIAGNOSIS — W01118D Fall on same level from slipping, tripping and stumbling with subsequent striking against other sharp object, subsequent encounter: Secondary | ICD-10-CM | POA: Diagnosis not present

## 2017-09-02 DIAGNOSIS — D696 Thrombocytopenia, unspecified: Secondary | ICD-10-CM | POA: Diagnosis not present

## 2017-09-04 DIAGNOSIS — W01118D Fall on same level from slipping, tripping and stumbling with subsequent striking against other sharp object, subsequent encounter: Secondary | ICD-10-CM | POA: Diagnosis not present

## 2017-09-04 DIAGNOSIS — Q761 Klippel-Feil syndrome: Secondary | ICD-10-CM | POA: Diagnosis not present

## 2017-09-04 DIAGNOSIS — D696 Thrombocytopenia, unspecified: Secondary | ICD-10-CM | POA: Diagnosis not present

## 2017-09-04 DIAGNOSIS — S065X0D Traumatic subdural hemorrhage without loss of consciousness, subsequent encounter: Secondary | ICD-10-CM | POA: Diagnosis not present

## 2017-09-04 DIAGNOSIS — D649 Anemia, unspecified: Secondary | ICD-10-CM | POA: Diagnosis not present

## 2017-09-04 DIAGNOSIS — D469 Myelodysplastic syndrome, unspecified: Secondary | ICD-10-CM | POA: Diagnosis not present

## 2017-09-06 ENCOUNTER — Ambulatory Visit: Payer: Self-pay | Admitting: Internal Medicine

## 2017-09-06 DIAGNOSIS — D649 Anemia, unspecified: Secondary | ICD-10-CM | POA: Diagnosis not present

## 2017-09-06 DIAGNOSIS — D696 Thrombocytopenia, unspecified: Secondary | ICD-10-CM | POA: Diagnosis not present

## 2017-09-06 DIAGNOSIS — Q761 Klippel-Feil syndrome: Secondary | ICD-10-CM | POA: Diagnosis not present

## 2017-09-06 DIAGNOSIS — W01118D Fall on same level from slipping, tripping and stumbling with subsequent striking against other sharp object, subsequent encounter: Secondary | ICD-10-CM | POA: Diagnosis not present

## 2017-09-06 DIAGNOSIS — D469 Myelodysplastic syndrome, unspecified: Secondary | ICD-10-CM | POA: Diagnosis not present

## 2017-09-06 DIAGNOSIS — S065X0D Traumatic subdural hemorrhage without loss of consciousness, subsequent encounter: Secondary | ICD-10-CM | POA: Diagnosis not present

## 2017-09-06 NOTE — Telephone Encounter (Signed)
This encounter was created in error - please disregard.

## 2017-09-06 NOTE — Telephone Encounter (Addendum)
Incoming call from  Reginald Daniels patients' guardian.  Stating that the therapist was out to ambulate the patient.  Patient refused to cooperate and " Peed on himself " . Guardian states that "its typical Reginald Daniels".  States Education officer, museum will be calling.  Call ended. Will call Reginald Daniels back.  400pm  Returned call to guardian of " Reginald Daniels".  Reginald Daniels states that  The patient has calm down.  Just didn't want to to cooperate.  He is safe.  Has not voiced to harm himself.  Is resting.

## 2017-09-06 NOTE — Telephone Encounter (Signed)
Pt 's physical therapist assistant Beth called and stated that she was told that pt stated "I wish they would let me die." Beth was not with pt at time of call. Called and left message for Mr Reginald Daniels to call back to triage pt.

## 2017-09-09 ENCOUNTER — Encounter: Payer: Self-pay | Admitting: *Deleted

## 2017-09-09 ENCOUNTER — Inpatient Hospital Stay: Payer: Medicare Other

## 2017-09-09 ENCOUNTER — Telehealth: Payer: Self-pay

## 2017-09-09 ENCOUNTER — Other Ambulatory Visit: Payer: Self-pay

## 2017-09-09 VITALS — BP 120/65 | HR 78 | Temp 98.9°F | Resp 18

## 2017-09-09 DIAGNOSIS — D469 Myelodysplastic syndrome, unspecified: Secondary | ICD-10-CM

## 2017-09-09 DIAGNOSIS — D539 Nutritional anemia, unspecified: Secondary | ICD-10-CM | POA: Diagnosis not present

## 2017-09-09 DIAGNOSIS — D72819 Decreased white blood cell count, unspecified: Secondary | ICD-10-CM | POA: Diagnosis not present

## 2017-09-09 DIAGNOSIS — D6182 Myelophthisis: Secondary | ICD-10-CM

## 2017-09-09 DIAGNOSIS — Z9181 History of falling: Secondary | ICD-10-CM | POA: Diagnosis not present

## 2017-09-09 LAB — CBC WITH DIFFERENTIAL/PLATELET
Basophils Absolute: 0.1 10*3/uL (ref 0.0–0.1)
Basophils Relative: 1 %
EOS ABS: 0.1 10*3/uL (ref 0.0–0.5)
Eosinophils Relative: 0 %
HCT: 21.3 % — ABNORMAL LOW (ref 38.4–49.9)
Hemoglobin: 7 g/dL — CL (ref 13.0–17.1)
LYMPHS ABS: 1.1 10*3/uL (ref 0.9–3.3)
Lymphocytes Relative: 6 %
MCH: 37.7 pg — AB (ref 27.2–33.4)
MCHC: 32.7 g/dL (ref 32.0–36.0)
MCV: 115.2 fL — AB (ref 79.3–98.0)
MONO ABS: 11.3 10*3/uL — AB (ref 0.1–0.9)
Monocytes Relative: 62 %
NEUTROS ABS: 5.6 10*3/uL (ref 1.5–6.5)
Neutrophils Relative %: 31 %
PLATELETS: 82 10*3/uL — AB (ref 140–400)
RBC: 1.85 MIL/uL — ABNORMAL LOW (ref 4.20–5.82)
RDW: 32.3 % — AB (ref 11.0–14.6)
Smear Review: 5
WBC: 18.2 10*3/uL — ABNORMAL HIGH (ref 4.0–10.3)
nRBC: 3 /100 WBC — ABNORMAL HIGH

## 2017-09-09 LAB — CMP (CANCER CENTER ONLY)
ALBUMIN: 2.9 g/dL — AB (ref 3.5–5.0)
ALT: 16 U/L (ref 0–44)
AST: 18 U/L (ref 15–41)
Alkaline Phosphatase: 67 U/L (ref 38–126)
Anion gap: 7 (ref 5–15)
BUN: 29 mg/dL — AB (ref 8–23)
CHLORIDE: 108 mmol/L (ref 98–111)
CO2: 21 mmol/L — AB (ref 22–32)
Calcium: 9.4 mg/dL (ref 8.9–10.3)
Creatinine: 1.45 mg/dL — ABNORMAL HIGH (ref 0.61–1.24)
GFR, Est AFR Am: 54 mL/min — ABNORMAL LOW (ref >60)
GFR, Estimated: 46 mL/min — ABNORMAL LOW (ref >60)
GLUCOSE: 100 mg/dL — AB (ref 70–99)
POTASSIUM: 4.5 mmol/L (ref 3.5–5.1)
Sodium: 136 mmol/L (ref 135–145)
Total Bilirubin: 1 mg/dL (ref 0.3–1.2)
Total Protein: 6.9 g/dL (ref 6.5–8.1)

## 2017-09-09 LAB — RETICULOCYTES
RBC.: 1.86 MIL/uL — ABNORMAL LOW (ref 4.20–5.82)
Retic Count, Absolute: 228.8 10*3/uL — ABNORMAL HIGH (ref 34.8–93.9)
Retic Ct Pct: 12.3 % — ABNORMAL HIGH (ref 0.8–1.8)

## 2017-09-09 LAB — PREPARE RBC (CROSSMATCH)

## 2017-09-09 MED ORDER — DIPHENHYDRAMINE HCL 25 MG PO CAPS
25.0000 mg | ORAL_CAPSULE | Freq: Once | ORAL | Status: AC
  Administered 2017-09-09: 25 mg via ORAL

## 2017-09-09 MED ORDER — DIPHENHYDRAMINE HCL 25 MG PO CAPS
ORAL_CAPSULE | ORAL | Status: AC
  Filled 2017-09-09: qty 1

## 2017-09-09 MED ORDER — SODIUM CHLORIDE 0.9 % IV SOLN
250.0000 mL | Freq: Once | INTRAVENOUS | Status: AC
  Administered 2017-09-09: 250 mL via INTRAVENOUS

## 2017-09-09 MED ORDER — DARBEPOETIN ALFA 500 MCG/ML IJ SOSY
PREFILLED_SYRINGE | INTRAMUSCULAR | Status: AC
  Filled 2017-09-09: qty 1

## 2017-09-09 MED ORDER — ACETAMINOPHEN 325 MG PO TABS
ORAL_TABLET | ORAL | Status: AC
  Filled 2017-09-09: qty 2

## 2017-09-09 MED ORDER — DARBEPOETIN ALFA 500 MCG/ML IJ SOSY
500.0000 ug | PREFILLED_SYRINGE | Freq: Once | INTRAMUSCULAR | Status: AC
  Administered 2017-09-09: 500 ug via SUBCUTANEOUS

## 2017-09-09 MED ORDER — ACETAMINOPHEN 325 MG PO TABS
650.0000 mg | ORAL_TABLET | Freq: Once | ORAL | Status: AC
  Administered 2017-09-09: 650 mg via ORAL

## 2017-09-09 NOTE — Patient Instructions (Signed)
Darbepoetin Alfa injection What is this medicine? DARBEPOETIN ALFA (dar be POE e tin AL fa) helps your body make more red blood cells. It is used to treat anemia caused by chronic kidney failure and chemotherapy. This medicine may be used for other purposes; ask your health care provider or pharmacist if you have questions. COMMON BRAND NAME(S): Aranesp What should I tell my health care provider before I take this medicine? They need to know if you have any of these conditions: -blood clotting disorders or history of blood clots -cancer patient not on chemotherapy -cystic fibrosis -heart disease, such as angina, heart failure, or a history of a heart attack -hemoglobin level of 12 g/dL or greater -high blood pressure -low levels of folate, iron, or vitamin B12 -seizures -an unusual or allergic reaction to darbepoetin, erythropoietin, albumin, hamster proteins, latex, other medicines, foods, dyes, or preservatives -pregnant or trying to get pregnant -breast-feeding How should I use this medicine? This medicine is for injection into a vein or under the skin. It is usually given by a health care professional in a hospital or clinic setting. If you get this medicine at home, you will be taught how to prepare and give this medicine. Use exactly as directed. Take your medicine at regular intervals. Do not take your medicine more often than directed. It is important that you put your used needles and syringes in a special sharps container. Do not put them in a trash can. If you do not have a sharps container, call your pharmacist or healthcare provider to get one. A special MedGuide will be given to you by the pharmacist with each prescription and refill. Be sure to read this information carefully each time. Talk to your pediatrician regarding the use of this medicine in children. While this medicine may be used in children as young as 1 year for selected conditions, precautions do  apply. Overdosage: If you think you have taken too much of this medicine contact a poison control center or emergency room at once. NOTE: This medicine is only for you. Do not share this medicine with others. What if I miss a dose? If you miss a dose, take it as soon as you can. If it is almost time for your next dose, take only that dose. Do not take double or extra doses. What may interact with this medicine? Do not take this medicine with any of the following medications: -epoetin alfa This list may not describe all possible interactions. Give your health care provider a list of all the medicines, herbs, non-prescription drugs, or dietary supplements you use. Also tell them if you smoke, drink alcohol, or use illegal drugs. Some items may interact with your medicine. What should I watch for while using this medicine? Your condition will be monitored carefully while you are receiving this medicine. You may need blood work done while you are taking this medicine. What side effects may I notice from receiving this medicine? Side effects that you should report to your doctor or health care professional as soon as possible: -allergic reactions like skin rash, itching or hives, swelling of the face, lips, or tongue -breathing problems -changes in vision -chest pain -confusion, trouble speaking or understanding -feeling faint or lightheaded, falls -high blood pressure -muscle aches or pains -pain, swelling, warmth in the leg -rapid weight gain -severe headaches -sudden numbness or weakness of the face, arm or leg -trouble walking, dizziness, loss of balance or coordination -seizures (convulsions) -swelling of the ankles, feet, hands -  ankles, feet, hands -unusually weak or tired Side effects that usually do not require medical attention (report to your doctor or health care professional if they continue or are bothersome): -diarrhea -fever, chills (flu-like symptoms) -headaches -nausea, vomiting -redness,  stinging, or swelling at site where injected This list may not describe all possible side effects. Call your doctor for medical advice about side effects. You may report side effects to FDA at 1-800-FDA-1088. Where should I keep my medicine? Keep out of the reach of children. Store in a refrigerator between 2 and 8 degrees C (36 and 46 degrees F). Do not freeze. Do not shake. Throw away any unused portion if using a single-dose vial. Throw away any unused medicine after the expiration date. NOTE: This sheet is a summary. It may not cover all possible information. If you have questions about this medicine, talk to your doctor, pharmacist, or health care provider.  2018 Elsevier/Gold Standard (2015-09-26 19:52:26) Blood Transfusion, Adult A blood transfusion is a procedure in which you receive donated blood, including plasma, platelets, and red blood cells, through an IV tube. You may need a blood transfusion because of illness, surgery, or injury. The blood may come from a donor. You may also be able to donate blood for yourself (autologous blood donation) before a surgery if you know that you might require a blood transfusion. The blood given in a transfusion is made up of different types of cells. You may receive:  Red blood cells. These carry oxygen to the cells in the body.  White blood cells. These help you fight infections.  Platelets. These help your blood to clot.  Plasma. This is the liquid part of your blood and it helps with fluid imbalances.  If you have hemophilia or another clotting disorder, you may also receive other types of blood products. Tell a health care provider about:  Any allergies you have.  All medicines you are taking, including vitamins, herbs, eye drops, creams, and over-the-counter medicines.  Any problems you or family members have had with anesthetic medicines.  Any blood disorders you have.  Any surgeries you have had.  Any medical conditions you  have, including any recent fever or cold symptoms.  Whether you are pregnant or may be pregnant.  Any previous reactions you have had during a blood transfusion. What are the risks? Generally, this is a safe procedure. However, problems may occur, including:  Having an allergic reaction to something in the donated blood. Hives and itching may be symptoms of this type of reaction.  Fever. This may be a reaction to the white blood cells in the transfused blood. Nausea or chest pain may accompany a fever.  Iron overload. This can happen from having many transfusions.  Transfusion-related acute lung injury (TRALI). This is a rare reaction that causes lung damage. The cause is not known.TRALI can occur within hours of a transfusion or several days later.  Sudden (acute) or delayed hemolytic reactions. This happens if your blood does not match the cells in your transfusion. Your body's defense system (immune system) may try to attack the new cells. This complication is rare. The symptoms include fever, chills, nausea, and low back pain or chest pain.  Infection or disease transmission. This is rare.  What happens before the procedure?  You will have a blood test to determine your blood type. This is necessary to know what kind of blood your body will accept and to match it to the donor blood.  If you   are going to have a planned surgery, you may be able to do an autologous blood donation. This may be done in case you need to have a transfusion.  If you have had an allergic reaction to a transfusion in the past, you may be given medicine to help prevent a reaction. This medicine may be given to you by mouth or through an IV tube.  You will have your temperature, blood pressure, and pulse monitored before the transfusion.  Follow instructions from your health care provider about eating and drinking restrictions.  Ask your health care provider about: ? Changing or stopping your regular  medicines. This is especially important if you are taking diabetes medicines or blood thinners. ? Taking medicines such as aspirin and ibuprofen. These medicines can thin your blood. Do not take these medicines before your procedure if your health care provider instructs you not to. What happens during the procedure?  An IV tube will be inserted into one of your veins.  The bag of donated blood will be attached to your IV tube. The blood will then enter through your vein.  Your temperature, blood pressure, and pulse will be monitored regularly during the transfusion. This monitoring is done to detect early signs of a transfusion reaction.  If you have any signs or symptoms of a reaction, your transfusion will be stopped and you may be given medicine.  When the transfusion is complete, your IV tube will be removed.  Pressure may be applied to the IV site for a few minutes.  A bandage (dressing) will be applied. The procedure may vary among health care providers and hospitals. What happens after the procedure?  Your temperature, blood pressure, heart rate, breathing rate, and blood oxygen level will be monitored often.  Your blood may be tested to see how you are responding to the transfusion.  You may be warmed with fluids or blankets to maintain a normal body temperature. Summary  A blood transfusion is a procedure in which you receive donated blood, including plasma, platelets, and red blood cells, through an IV tube.  Your temperature, blood pressure, and pulse will be monitored before, during, and after the transfusion.  Your blood may be tested after the transfusion to see how your body has responded. This information is not intended to replace advice given to you by your health care provider. Make sure you discuss any questions you have with your health care provider. Document Released: 02/03/2000 Document Revised: 11/03/2015 Document Reviewed: 11/03/2015 Elsevier Interactive  Patient Education  2018 Elsevier Inc.  

## 2017-09-09 NOTE — Telephone Encounter (Signed)
Dr. Irene Limbo made aware of patient's CBC results and two units of blood ordered. Merleen Nicely, RN infusion nurse made aware.

## 2017-09-09 NOTE — Progress Notes (Signed)
[place] [employer]  Hol[employer] representative received referral from Therapist, sports for assessment of needs.  CSW met with patient in the infusion room to offer support and assess for needs.  Patient stated he lives with [name], who is his guardian and caregiver.  CSW contacted [name] by phone.  [name] stated within the last week patient began to experience new behaviors.  [name] stated patient will no longer use his walker or assist with transfers to the car, bed, toilet, ect.  Patient also becomes upset when [name] suggest using the walker or attempts to transfer him to the car.  [name] reported he has been in contact with a Education officer, museum through [employer].  There is also a home health RN and physical therapist that visit the home 2 to 3 times a week. [name] stated patient has an appointment with the "brain surgeon" tomorrow and is hopeful he will gain information to help explain recent behaviors.  CSW and [name] discussed additional resources that could be helpful in the home.  [name] is willing to explore options after patients appointment tomorrow.  CSW provided contact information and encouraged [name] to call with questions or concerns.   Johnnye Lana, MSW, LCSW, OSW-C Clinical Social Worker [facility] [phone]

## 2017-09-10 LAB — TYPE AND SCREEN
ABO/RH(D): O NEG
Antibody Screen: NEGATIVE
UNIT DIVISION: 0
Unit division: 0

## 2017-09-10 LAB — BPAM RBC
BLOOD PRODUCT EXPIRATION DATE: 201908192359
Blood Product Expiration Date: 201908192359
ISSUE DATE / TIME: 201907221052
ISSUE DATE / TIME: 201907221052
Unit Type and Rh: 9500
Unit Type and Rh: 9500

## 2017-09-11 DIAGNOSIS — D469 Myelodysplastic syndrome, unspecified: Secondary | ICD-10-CM | POA: Diagnosis not present

## 2017-09-11 DIAGNOSIS — Q761 Klippel-Feil syndrome: Secondary | ICD-10-CM | POA: Diagnosis not present

## 2017-09-11 DIAGNOSIS — D649 Anemia, unspecified: Secondary | ICD-10-CM | POA: Diagnosis not present

## 2017-09-11 DIAGNOSIS — W01118D Fall on same level from slipping, tripping and stumbling with subsequent striking against other sharp object, subsequent encounter: Secondary | ICD-10-CM | POA: Diagnosis not present

## 2017-09-11 DIAGNOSIS — D696 Thrombocytopenia, unspecified: Secondary | ICD-10-CM | POA: Diagnosis not present

## 2017-09-11 DIAGNOSIS — S065X0D Traumatic subdural hemorrhage without loss of consciousness, subsequent encounter: Secondary | ICD-10-CM | POA: Diagnosis not present

## 2017-09-12 ENCOUNTER — Ambulatory Visit (INDEPENDENT_AMBULATORY_CARE_PROVIDER_SITE_OTHER): Payer: Medicare Other | Admitting: Internal Medicine

## 2017-09-12 ENCOUNTER — Encounter: Payer: Self-pay | Admitting: Internal Medicine

## 2017-09-12 VITALS — BP 116/54 | HR 74

## 2017-09-12 DIAGNOSIS — S065X0D Traumatic subdural hemorrhage without loss of consciousness, subsequent encounter: Secondary | ICD-10-CM

## 2017-09-12 DIAGNOSIS — D469 Myelodysplastic syndrome, unspecified: Secondary | ICD-10-CM | POA: Diagnosis not present

## 2017-09-12 DIAGNOSIS — F4322 Adjustment disorder with anxiety: Secondary | ICD-10-CM | POA: Diagnosis not present

## 2017-09-12 MED ORDER — BUPROPION HCL 75 MG PO TABS: 75.0000 mg | ORAL_TABLET | Freq: Two times a day (BID) | ORAL | 2 refills | Status: AC

## 2017-09-12 NOTE — Progress Notes (Signed)
Subjective:    Patient ID: Reginald Daniels, male    DOB: 1945/03/15, 72 y.o.   MRN: [id number]  HPI  72 year old patient who was hospitalized recently for a traumatic right-sided subdural hematoma. He is followed closely by hematology with [facility] with transfusion dependent anemia.  He was transfused 2 units 3 days ago. Following his hospital discharge, he has been receiving home physical therapy but requires considerable encouragement to participate.  He has been refusing to get out of bed or wheelchair and his caregiver is forced to have multiple clothing changes throughout the day due to incontinence and refusal to ambulate to the bathroom. When encouraged she is amatory with a walker but often refuses therapy.  Past Medical History:  Diagnosis Date  . Cancer [facility])    prostate  . Leukopenia   . Macrocytosis   . Mental retardation   . Thrombocytopenia ([place])      Social History   Socioeconomic History  . Marital status: Married    Spouse name: Not on file  . Number of children: Not on file  . Years of education: Not on file  . Highest education level: Not on file  Occupational History  . Not on file  Social Needs  . Financial resource strain: Not on file  . Food insecurity:    Worry: Not on file    Inability: Not on file  . Transportation needs:    Medical: Not on file    Non-medical: Not on file  Tobacco Use  . Smoking status: Never Smoker  . Smokeless tobacco: Never Used  Substance and Sexual Activity  . Alcohol use: No  . Drug use: No  . Sexual activity: Not on file  Lifestyle  . Physical activity:    Days per week: Not on file    Minutes per session: Not on file  . Stress: Not on file  Relationships  . Social connections:    Talks on phone: Not on file    Gets together: Not on file    Attends religious service: Not on file    Active member of club or organization: Not on file    Attends meetings of clubs or organizations: Not on file    Relationship  status: Not on file  . Intimate partner violence:    Fear of current or ex partner: Not on file    Emotionally abused: Not on file    Physically abused: Not on file    Forced sexual activity: Not on file  Other Topics Concern  . Not on file  Social History Narrative  . Not on file    No past surgical history on file.  No family history on file.  No Known Allergies  Current Outpatient Medications on File Prior to Visit  Medication Sig Dispense Refill  . acetaminophen (TYLENOL) 325 MG tablet Take 2 tablets (650 mg total) by mouth every 6 (six) hours as needed for mild pain, moderate pain or headache.    . b complex vitamins capsule Take 1 capsule by mouth daily.    . cetirizine (ZYRTEC) 10 MG tablet Take 10 mg by mouth daily.    . EPOETIN ALFA IJ Inject 1 each as directed every 14 (fourteen) days.    . magic mouthwash SOLN Take 5 mLs by mouth 3 (three) times daily. 50 mL 0   No current facility-administered medications on file prior to visit.     BP (!) 116/54 (BP Location: Left Arm, Patient Position: Sitting, Cuff  Size: Normal)   Pulse 74 Comment: manual    Review of Systems  Constitutional: Negative for appetite change, chills, fatigue and fever.  HENT: Negative for congestion, dental problem, ear pain, hearing loss, sore throat, tinnitus, trouble swallowing and voice change.   Eyes: Negative for pain, discharge and visual disturbance.  Respiratory: Negative for cough, chest tightness, wheezing and stridor.   Cardiovascular: Negative for chest pain, palpitations and leg swelling.  Gastrointestinal: Negative for abdominal distention, abdominal pain, blood in stool, constipation, diarrhea, nausea and vomiting.  Genitourinary: Negative for difficulty urinating, discharge, flank pain, genital sores, hematuria and urgency.  Musculoskeletal: Positive for gait problem. Negative for arthralgias, back pain, joint swelling, myalgias and neck stiffness.  Skin: Negative for rash.    Neurological: Negative for dizziness, syncope, speech difficulty, weakness, numbness and headaches.  Hematological: Negative for adenopathy. Does not bruise/bleed easily.  Psychiatric/Behavioral: Positive for behavioral problems and decreased concentration. Negative for dysphoric mood. The patient is not nervous/anxious.        Objective:   Physical Exam  Constitutional: He is oriented to person, place, and time. He appears well-developed.  Sitting in a wheelchair  HENT:  Head: Normocephalic.  Right Ear: External ear normal.  Left Ear: External ear normal.  Eyes: Conjunctivae and EOM are normal.  Neck: Normal range of motion.  Cardiovascular: Normal rate and normal heart sounds.  Pulmonary/Chest: Breath sounds normal.  Abdominal: Bowel sounds are normal.  Musculoskeletal: Normal range of motion. He exhibits no edema or tenderness.  Neurological: He is alert and oriented to person, place, and time.  Able to extend both legs and hold in a horizontal position but slightly weaker on the right which has been chronic. He has chronic atrophy of the right thigh  No drift of the outstretched arms Grip strength symmetrical  He is able to stand with encouragement but then wishes to sit down promptly  Psychiatric: He has a normal mood and affect. His behavior is normal.          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  Will give a trial of bupropion 75 mg twice daily.  Continue physical therapy and encouragement  Follow-up 4 weeks  Marletta Lor

## 2017-09-12 NOTE — Patient Instructions (Signed)
Continue physical therapy  Bupropion 1 tablet twice daily  Return in 4 weeks for follow-up

## 2017-09-13 ENCOUNTER — Telehealth: Payer: Self-pay | Admitting: Pulmonary Disease

## 2017-09-13 DIAGNOSIS — S065X0D Traumatic subdural hemorrhage without loss of consciousness, subsequent encounter: Secondary | ICD-10-CM | POA: Diagnosis not present

## 2017-09-13 DIAGNOSIS — W01118D Fall on same level from slipping, tripping and stumbling with subsequent striking against other sharp object, subsequent encounter: Secondary | ICD-10-CM | POA: Diagnosis not present

## 2017-09-13 DIAGNOSIS — D696 Thrombocytopenia, unspecified: Secondary | ICD-10-CM | POA: Diagnosis not present

## 2017-09-13 DIAGNOSIS — Q761 Klippel-Feil syndrome: Secondary | ICD-10-CM | POA: Diagnosis not present

## 2017-09-13 DIAGNOSIS — D649 Anemia, unspecified: Secondary | ICD-10-CM | POA: Diagnosis not present

## 2017-09-13 DIAGNOSIS — D469 Myelodysplastic syndrome, unspecified: Secondary | ICD-10-CM | POA: Diagnosis not present

## 2017-09-13 NOTE — Telephone Encounter (Signed)
rec'd fax from [facility] regarding home health certification and plan of care for pt forms Placed in VS green to do folder for further review.

## 2017-09-16 DIAGNOSIS — Q761 Klippel-Feil syndrome: Secondary | ICD-10-CM | POA: Diagnosis not present

## 2017-09-16 DIAGNOSIS — D696 Thrombocytopenia, unspecified: Secondary | ICD-10-CM | POA: Diagnosis not present

## 2017-09-16 DIAGNOSIS — D469 Myelodysplastic syndrome, unspecified: Secondary | ICD-10-CM | POA: Diagnosis not present

## 2017-09-16 DIAGNOSIS — D649 Anemia, unspecified: Secondary | ICD-10-CM | POA: Diagnosis not present

## 2017-09-16 DIAGNOSIS — S065X0D Traumatic subdural hemorrhage without loss of consciousness, subsequent encounter: Secondary | ICD-10-CM | POA: Diagnosis not present

## 2017-09-16 DIAGNOSIS — W01118D Fall on same level from slipping, tripping and stumbling with subsequent striking against other sharp object, subsequent encounter: Secondary | ICD-10-CM | POA: Diagnosis not present

## 2017-09-17 ENCOUNTER — Telehealth: Payer: Self-pay | Admitting: Family Medicine

## 2017-09-17 DIAGNOSIS — D649 Anemia, unspecified: Secondary | ICD-10-CM | POA: Diagnosis not present

## 2017-09-17 DIAGNOSIS — D696 Thrombocytopenia, unspecified: Secondary | ICD-10-CM | POA: Diagnosis not present

## 2017-09-17 DIAGNOSIS — S065X0D Traumatic subdural hemorrhage without loss of consciousness, subsequent encounter: Secondary | ICD-10-CM | POA: Diagnosis not present

## 2017-09-17 DIAGNOSIS — D469 Myelodysplastic syndrome, unspecified: Secondary | ICD-10-CM | POA: Diagnosis not present

## 2017-09-17 DIAGNOSIS — W01118D Fall on same level from slipping, tripping and stumbling with subsequent striking against other sharp object, subsequent encounter: Secondary | ICD-10-CM | POA: Diagnosis not present

## 2017-09-17 DIAGNOSIS — Q761 Klippel-Feil syndrome: Secondary | ICD-10-CM | POA: Diagnosis not present

## 2017-09-17 NOTE — Telephone Encounter (Signed)
Copied from [place] [id number]. Topic: Inquiry >> Sep 16, 2017  5:08 PM Oliver Pila B wrote: Reason for CRM: [facility] called to speak w/ nurse about pt; pt is uncooperative and need additional skilled nursing orders; contact [phone]

## 2017-09-17 NOTE — Telephone Encounter (Signed)
Left detailed message for [facility] to return phone call.

## 2017-09-18 DIAGNOSIS — D469 Myelodysplastic syndrome, unspecified: Secondary | ICD-10-CM | POA: Diagnosis not present

## 2017-09-18 DIAGNOSIS — D649 Anemia, unspecified: Secondary | ICD-10-CM | POA: Diagnosis not present

## 2017-09-18 DIAGNOSIS — Q761 Klippel-Feil syndrome: Secondary | ICD-10-CM | POA: Diagnosis not present

## 2017-09-18 DIAGNOSIS — D696 Thrombocytopenia, unspecified: Secondary | ICD-10-CM | POA: Diagnosis not present

## 2017-09-18 DIAGNOSIS — S065X0D Traumatic subdural hemorrhage without loss of consciousness, subsequent encounter: Secondary | ICD-10-CM | POA: Diagnosis not present

## 2017-09-18 DIAGNOSIS — W01118D Fall on same level from slipping, tripping and stumbling with subsequent striking against other sharp object, subsequent encounter: Secondary | ICD-10-CM | POA: Diagnosis not present

## 2017-09-18 NOTE — Telephone Encounter (Signed)
Margaretha Sheffield from [facility] returned call , please see previous message, [phone]

## 2017-09-19 NOTE — Telephone Encounter (Signed)
Margaretha Sheffield [facility] stated that pt is getting worse. Pt does not have an interest in eating or therapy. She stated that he does not want to do anything and stated that " I don't want to do anything at all". Margaretha Sheffield stated that she wants to continue coming out due to pt just wanting to see down in his urine and stool. She also claims that the pt family could not get him up to see his neurosurgeon.  Margaretha Sheffield is concerned that pt is going through depression.   Margaretha Sheffield wants to know if she can continue nursing until pt is put into a facility.   Please advise

## 2017-09-20 ENCOUNTER — Telehealth: Payer: Self-pay

## 2017-09-20 ENCOUNTER — Other Ambulatory Visit: Payer: Self-pay | Admitting: Internal Medicine

## 2017-09-20 MED ORDER — ESCITALOPRAM OXALATE 5 MG PO TABS: 5.0000 mg | ORAL_TABLET | Freq: Every day | ORAL | 2 refills | Status: AC

## 2017-09-20 NOTE — Telephone Encounter (Signed)
Called Reginald Daniels to see if she has a copy of the pt legal gaurdian so that we could put it into pt chart.

## 2017-09-20 NOTE — Telephone Encounter (Signed)
Okay to continue nursing visits Make family aware that a new prescription for possible depression is available at his drugstore to start taking once daily

## 2017-09-20 NOTE — Telephone Encounter (Signed)
Verbal orders given to [facility]. Reginald Daniels was also given okay orders to get a urine specimen due to incontinence.

## 2017-09-21 ENCOUNTER — Observation Stay (HOSPITAL_COMMUNITY)
Admission: EM | Admit: 2017-09-21 | Discharge: 2017-10-20 | Disposition: E | Payer: Medicare Other | Attending: Internal Medicine | Admitting: Internal Medicine

## 2017-09-21 ENCOUNTER — Other Ambulatory Visit: Payer: Self-pay

## 2017-09-21 ENCOUNTER — Emergency Department (HOSPITAL_COMMUNITY): Payer: Medicare Other

## 2017-09-21 DIAGNOSIS — N179 Acute kidney failure, unspecified: Secondary | ICD-10-CM | POA: Diagnosis not present

## 2017-09-21 DIAGNOSIS — D539 Nutritional anemia, unspecified: Secondary | ICD-10-CM | POA: Insufficient documentation

## 2017-09-21 DIAGNOSIS — S065X9A Traumatic subdural hemorrhage with loss of consciousness of unspecified duration, initial encounter: Secondary | ICD-10-CM | POA: Diagnosis present

## 2017-09-21 DIAGNOSIS — Z9181 History of falling: Secondary | ICD-10-CM | POA: Diagnosis not present

## 2017-09-21 DIAGNOSIS — I959 Hypotension, unspecified: Secondary | ICD-10-CM | POA: Insufficient documentation

## 2017-09-21 DIAGNOSIS — D469 Myelodysplastic syndrome, unspecified: Principal | ICD-10-CM | POA: Diagnosis present

## 2017-09-21 DIAGNOSIS — E871 Hypo-osmolality and hyponatremia: Secondary | ICD-10-CM | POA: Insufficient documentation

## 2017-09-21 DIAGNOSIS — E870 Hyperosmolality and hypernatremia: Secondary | ICD-10-CM | POA: Diagnosis not present

## 2017-09-21 DIAGNOSIS — Z8546 Personal history of malignant neoplasm of prostate: Secondary | ICD-10-CM | POA: Diagnosis not present

## 2017-09-21 DIAGNOSIS — W19XXXS Unspecified fall, sequela: Secondary | ICD-10-CM | POA: Insufficient documentation

## 2017-09-21 DIAGNOSIS — R58 Hemorrhage, not elsewhere classified: Secondary | ICD-10-CM | POA: Diagnosis not present

## 2017-09-21 DIAGNOSIS — F329 Major depressive disorder, single episode, unspecified: Secondary | ICD-10-CM | POA: Diagnosis not present

## 2017-09-21 DIAGNOSIS — E875 Hyperkalemia: Secondary | ICD-10-CM | POA: Diagnosis not present

## 2017-09-21 DIAGNOSIS — Q761 Klippel-Feil syndrome: Secondary | ICD-10-CM | POA: Insufficient documentation

## 2017-09-21 DIAGNOSIS — R296 Repeated falls: Secondary | ICD-10-CM | POA: Diagnosis not present

## 2017-09-21 DIAGNOSIS — S065X9S Traumatic subdural hemorrhage with loss of consciousness of unspecified duration, sequela: Secondary | ICD-10-CM | POA: Diagnosis not present

## 2017-09-21 DIAGNOSIS — Z515 Encounter for palliative care: Secondary | ICD-10-CM

## 2017-09-21 DIAGNOSIS — A419 Sepsis, unspecified organism: Secondary | ICD-10-CM | POA: Diagnosis not present

## 2017-09-21 DIAGNOSIS — K449 Diaphragmatic hernia without obstruction or gangrene: Secondary | ICD-10-CM | POA: Insufficient documentation

## 2017-09-21 DIAGNOSIS — S065XAA Traumatic subdural hemorrhage with loss of consciousness status unknown, initial encounter: Secondary | ICD-10-CM | POA: Diagnosis present

## 2017-09-21 DIAGNOSIS — D696 Thrombocytopenia, unspecified: Secondary | ICD-10-CM | POA: Insufficient documentation

## 2017-09-21 DIAGNOSIS — R0689 Other abnormalities of breathing: Secondary | ICD-10-CM | POA: Diagnosis not present

## 2017-09-21 DIAGNOSIS — R5383 Other fatigue: Secondary | ICD-10-CM | POA: Insufficient documentation

## 2017-09-21 DIAGNOSIS — R404 Transient alteration of awareness: Secondary | ICD-10-CM | POA: Diagnosis not present

## 2017-09-21 DIAGNOSIS — D72829 Elevated white blood cell count, unspecified: Secondary | ICD-10-CM | POA: Diagnosis present

## 2017-09-21 DIAGNOSIS — F79 Unspecified intellectual disabilities: Secondary | ICD-10-CM | POA: Insufficient documentation

## 2017-09-21 DIAGNOSIS — Z79899 Other long term (current) drug therapy: Secondary | ICD-10-CM | POA: Diagnosis not present

## 2017-09-21 DIAGNOSIS — D649 Anemia, unspecified: Secondary | ICD-10-CM | POA: Diagnosis present

## 2017-09-21 LAB — CBC WITH DIFFERENTIAL/PLATELET
BAND NEUTROPHILS: 14 %
BASOS ABS: 0 10*3/uL (ref 0.0–0.1)
BLASTS: 0 %
Basophils Relative: 0 %
EOS ABS: 0 10*3/uL (ref 0.0–0.7)
Eosinophils Relative: 0 %
HEMATOCRIT: 12.7 % — AB (ref 39.0–52.0)
HEMOGLOBIN: 4.6 g/dL — AB (ref 13.0–17.0)
Lymphocytes Relative: 14 %
Lymphs Abs: 8.3 10*3/uL — ABNORMAL HIGH (ref 0.7–4.0)
MCH: 44.2 pg — ABNORMAL HIGH (ref 26.0–34.0)
MCHC: 36.2 g/dL — ABNORMAL HIGH (ref 30.0–36.0)
MCV: 122.1 fL — AB (ref 78.0–100.0)
METAMYELOCYTES PCT: 2 %
MYELOCYTES: 1 %
Monocytes Absolute: 14.3 10*3/uL — ABNORMAL HIGH (ref 0.1–1.0)
Monocytes Relative: 24 %
Neutro Abs: 37 10*3/uL — ABNORMAL HIGH (ref 1.7–7.7)
Neutrophils Relative %: 45 %
Other: 0 %
PROMYELOCYTES RELATIVE: 0 %
Platelets: 86 10*3/uL — ABNORMAL LOW (ref 150–400)
RBC: 1.04 MIL/uL — ABNORMAL LOW (ref 4.22–5.81)
RDW: 33.9 % — AB (ref 11.5–15.5)
WBC: 59.6 10*3/uL (ref 4.0–10.5)
nRBC: 15 /100 WBC — ABNORMAL HIGH

## 2017-09-21 LAB — URINALYSIS, ROUTINE W REFLEX MICROSCOPIC
Bilirubin Urine: NEGATIVE
Glucose, UA: NEGATIVE mg/dL
HGB URINE DIPSTICK: NEGATIVE
KETONES UR: NEGATIVE mg/dL
Leukocytes, UA: NEGATIVE
Nitrite: NEGATIVE
PH: 5 (ref 5.0–8.0)
PROTEIN: NEGATIVE mg/dL
Specific Gravity, Urine: 1.018 (ref 1.005–1.030)

## 2017-09-21 LAB — COMPREHENSIVE METABOLIC PANEL
ALT: 51 U/L — AB (ref 0–44)
AST: 56 U/L — AB (ref 15–41)
Albumin: 2.4 g/dL — ABNORMAL LOW (ref 3.5–5.0)
Alkaline Phosphatase: 61 U/L (ref 38–126)
Anion gap: 13 (ref 5–15)
BILIRUBIN TOTAL: 1.3 mg/dL — AB (ref 0.3–1.2)
BUN: 110 mg/dL — AB (ref 8–23)
CO2: 13 mmol/L — ABNORMAL LOW (ref 22–32)
CREATININE: 5.53 mg/dL — AB (ref 0.61–1.24)
Calcium: 8.4 mg/dL — ABNORMAL LOW (ref 8.9–10.3)
Chloride: 124 mmol/L — ABNORMAL HIGH (ref 98–111)
GFR, EST AFRICAN AMERICAN: 11 mL/min — AB (ref >60)
GFR, EST NON AFRICAN AMERICAN: 9 mL/min — AB (ref >60)
Glucose, Bld: 103 mg/dL — ABNORMAL HIGH (ref 70–99)
Potassium: 5.5 mmol/L — ABNORMAL HIGH (ref 3.5–5.1)
Sodium: 150 mmol/L — ABNORMAL HIGH (ref 135–145)
TOTAL PROTEIN: 6 g/dL — AB (ref 6.5–8.1)

## 2017-09-21 LAB — I-STAT ARTERIAL BLOOD GAS, ED
Acid-base deficit: 14 mmol/L — ABNORMAL HIGH (ref 0.0–2.0)
BICARBONATE: 12.4 mmol/L — AB (ref 20.0–28.0)
O2 Saturation: 99 %
PO2 ART: 166 mmHg — AB (ref 83.0–108.0)
TCO2: 13 mmol/L — AB (ref 22–32)
pCO2 arterial: 29.1 mmHg — ABNORMAL LOW (ref 32.0–48.0)
pH, Arterial: 7.238 — ABNORMAL LOW (ref 7.350–7.450)

## 2017-09-21 LAB — PROTIME-INR
INR: 1.81
PROTHROMBIN TIME: 20.8 s — AB (ref 11.4–15.2)

## 2017-09-21 LAB — SAVE SMEAR

## 2017-09-21 LAB — I-STAT CG4 LACTIC ACID, ED: Lactic Acid, Venous: 2.42 mmol/L (ref 0.5–1.9)

## 2017-09-21 MED ORDER — ACETAMINOPHEN 650 MG RE SUPP: 650.0000 mg | Freq: Four times a day (QID) | RECTAL | Status: DC | PRN

## 2017-09-21 MED ORDER — HALOPERIDOL LACTATE 2 MG/ML PO CONC
0.5000 mg | ORAL | Status: DC | PRN
  Filled 2017-09-21: qty 0.3

## 2017-09-21 MED ORDER — MORPHINE SULFATE (PF) 2 MG/ML IV SOLN
1.0000 mg | INTRAVENOUS | Status: DC | PRN
  Administered 2017-09-21: 1 mg via INTRAVENOUS
  Filled 2017-09-21 (×2): qty 1

## 2017-09-21 MED ORDER — ONDANSETRON 4 MG PO TBDP: 4.0000 mg | ORAL_TABLET | Freq: Four times a day (QID) | ORAL | Status: DC | PRN

## 2017-09-21 MED ORDER — GLYCOPYRROLATE 0.2 MG/ML IJ SOLN
0.2000 mg | INTRAMUSCULAR | Status: DC | PRN
  Administered 2017-09-21: 0.2 mg via INTRAVENOUS
  Filled 2017-09-21: qty 1

## 2017-09-21 MED ORDER — SODIUM CHLORIDE 0.9 % IV BOLUS (SEPSIS)
1000.0000 mL | Freq: Once | INTRAVENOUS | Status: AC
  Administered 2017-09-21: 1000 mL via INTRAVENOUS

## 2017-09-21 MED ORDER — GLYCOPYRROLATE 0.2 MG/ML IJ SOLN: 0.2000 mg | INTRAMUSCULAR | Status: DC | PRN

## 2017-09-21 MED ORDER — SODIUM CHLORIDE 0.9 % IV BOLUS (SEPSIS)
500.0000 mL | Freq: Once | INTRAVENOUS | Status: AC
  Administered 2017-09-21: 500 mL via INTRAVENOUS

## 2017-09-21 MED ORDER — HALOPERIDOL 0.5 MG PO TABS: 0.5000 mg | ORAL_TABLET | ORAL | Status: DC | PRN

## 2017-09-21 MED ORDER — MORPHINE SULFATE (PF) 4 MG/ML IV SOLN
2.0000 mg | Freq: Once | INTRAVENOUS | Status: AC
  Administered 2017-09-21: 2 mg via INTRAVENOUS
  Filled 2017-09-21: qty 1

## 2017-09-21 MED ORDER — PIPERACILLIN-TAZOBACTAM IN DEX 2-0.25 GM/50ML IV SOLN
2.2500 g | Freq: Three times a day (TID) | INTRAVENOUS | Status: DC
  Filled 2017-09-21: qty 50

## 2017-09-21 MED ORDER — HALOPERIDOL LACTATE 5 MG/ML IJ SOLN: 0.5000 mg | INTRAMUSCULAR | Status: DC | PRN

## 2017-09-21 MED ORDER — POLYVINYL ALCOHOL 1.4 % OP SOLN
1.0000 [drp] | Freq: Four times a day (QID) | OPHTHALMIC | Status: DC | PRN
  Filled 2017-09-21: qty 15

## 2017-09-21 MED ORDER — VANCOMYCIN HCL IN DEXTROSE 1-5 GM/200ML-% IV SOLN
1000.0000 mg | Freq: Once | INTRAVENOUS | Status: AC
  Administered 2017-09-21: 1000 mg via INTRAVENOUS
  Filled 2017-09-21: qty 200

## 2017-09-21 MED ORDER — GLYCOPYRROLATE 1 MG PO TABS
1.0000 mg | ORAL_TABLET | ORAL | Status: DC | PRN
  Filled 2017-09-21: qty 1

## 2017-09-21 MED ORDER — POLYVINYL ALCOHOL 1.4 % OP SOLN: 1.0000 [drp] | Freq: Four times a day (QID) | OPHTHALMIC | Status: DC | PRN

## 2017-09-21 MED ORDER — BIOTENE DRY MOUTH MT LIQD: 15.0000 mL | OROMUCOSAL | Status: DC | PRN

## 2017-09-21 MED ORDER — ACETAMINOPHEN 650 MG RE SUPP
650.0000 mg | Freq: Once | RECTAL | Status: AC
  Administered 2017-09-21: 650 mg via RECTAL
  Filled 2017-09-21: qty 1

## 2017-09-21 MED ORDER — ONDANSETRON HCL 4 MG/2ML IJ SOLN: 4.0000 mg | Freq: Four times a day (QID) | INTRAMUSCULAR | Status: DC | PRN

## 2017-09-21 MED ORDER — TEMAZEPAM 7.5 MG PO CAPS: 7.5000 mg | ORAL_CAPSULE | Freq: Every evening | ORAL | Status: DC | PRN

## 2017-09-21 MED ORDER — GLYCOPYRROLATE 1 MG PO TABS: 1.0000 mg | ORAL_TABLET | ORAL | Status: DC | PRN

## 2017-09-21 MED ORDER — ACETAMINOPHEN 325 MG PO TABS: 650.0000 mg | ORAL_TABLET | Freq: Four times a day (QID) | ORAL | Status: DC | PRN

## 2017-09-21 MED ORDER — PIPERACILLIN-TAZOBACTAM 3.375 G IVPB 30 MIN
3.3750 g | Freq: Once | INTRAVENOUS | Status: AC
  Administered 2017-09-21: 3.375 g via INTRAVENOUS
  Filled 2017-09-21: qty 50

## 2017-09-21 MED ORDER — HALOPERIDOL 0.5 MG PO TABS
0.5000 mg | ORAL_TABLET | ORAL | Status: DC | PRN
  Filled 2017-09-21: qty 1

## 2017-09-21 MED ORDER — HALOPERIDOL LACTATE 2 MG/ML PO CONC: 0.5000 mg | ORAL | Status: DC | PRN

## 2017-09-21 MED ORDER — SODIUM CHLORIDE 0.9 % IV BOLUS (SEPSIS)
250.0000 mL | Freq: Once | INTRAVENOUS | Status: AC
  Administered 2017-09-21: 250 mL via INTRAVENOUS

## 2017-09-23 ENCOUNTER — Telehealth: Payer: Self-pay

## 2017-09-23 ENCOUNTER — Inpatient Hospital Stay: Payer: Medicare Other

## 2017-09-23 NOTE — Telephone Encounter (Signed)
Family member called and left a message that patient passed away 2022-09-30. Appts already canceled for today.

## 2017-09-26 LAB — CULTURE, BLOOD (ROUTINE X 2)
CULTURE: NO GROWTH
CULTURE: NO GROWTH
Special Requests: ADEQUATE

## 2017-10-07 ENCOUNTER — Other Ambulatory Visit: Payer: Medicare Other

## 2017-10-07 ENCOUNTER — Ambulatory Visit: Payer: Medicare Other

## 2017-10-20 NOTE — ED Notes (Signed)
1 set of cultures drawn before zosyn started

## 2017-10-20 NOTE — Discharge Summary (Signed)
Death Summary  Reginald Daniels SWN:[id number] DOB: May 09, 1945 DOA: 10/12/2017  PCP: Marletta Lor, MD  Admit date: October 12, 2017 Date of Death: 12-Oct-2017 Time of Death: 06/06/10 Notification: Marletta Lor, MD notified of death of 15-Oct-2017   History of present illness:  Reginald Daniels is a 72 y.o. 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 Reginald Daniels did not improve after initial resuscitation in the ED.  The patient was living with a caregiver, Reginald Daniels.  At the time of initial evaluation and concern for progression, Reginald Daniels had a discussion with the EDP and then myself about transitioning to comfort care for Reginald Daniels.  Reginald Daniels felt he would not have a meaningful recovery and did not want him to go through an ICU stay based on my conversation with him.  Reginald Daniels was transitioned to comfort care and made DNR.   Final Diagnoses:  1.   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  Blood cultures have been NGTD at time of this summary.  2. AKI with hyperkalemia and hyponatremia   The results of significant diagnostics from this hospitalization (including imaging, microbiology, ancillary and laboratory) are listed below for reference.    Significant Diagnostic Studies: Dg Chest Portable 1 View  Result Date: 2017-10-12 CLINICAL DATA:  Per ED notes, pt here from home via [facility]. Son was dressing pt today and noted that he was lethargic. Son states recent falls with subdural hematoma. Temp 102 oral. RR42, 98% RA. Non-smoker EXAM: PORTABLE CHEST 1 VIEW COMPARISON:  08/21/2017 FINDINGS: The heart is enlarged with prominent LEFT ventricular contour. Hiatal hernia. The lungs are free of focal consolidations and pleural effusions. No pulmonary edema. IMPRESSION: Cardiomegaly.   Hiatal hernia. Electronically Signed   By: Reginald Daniels M.D.   On: 10-12-2017 13:13    Microbiology: Recent Results (from the past 240 hour(s))  Culture, blood (Routine x 2)     Status: None (Preliminary result)   Collection Time: 10/12/2017  1:13 PM  Result Value Ref Range Status   Specimen Description BLOOD BLOOD RIGHT HAND  Final   Special Requests   Final    BOTTLES DRAWN AEROBIC AND ANAEROBIC Blood Culture adequate volume   Culture   Final    NO GROWTH 2 DAYS Performed at [facility], [place] [address] [place], [place] [address]    Report Status PENDING  Incomplete  Culture, blood (Routine x 2)     Status: None (Preliminary result)   Collection Time: 2017-10-12  1:13 PM  Result Value Ref Range Status   Specimen Description BLOOD RIGHT ANTECUBITAL  Final   Special Requests   Final    BOTTLES DRAWN AEROBIC AND ANAEROBIC Blood Culture results may not be optimal due to an inadequate volume of blood received in culture bottles   Culture   Final    NO GROWTH 2 DAYS Performed at [facility], [place] [address], [place], [place] [address]    Report Status PENDING  Incomplete     Labs: Basic Metabolic Panel: Recent Labs  Lab 10-12-17 1313  NA 150*  K 5.5*  CL 124*  CO2 13*  GLUCOSE 103*  BUN 110*  CREATININE 5.53*  CALCIUM 8.4*   Liver Function Tests: Recent Labs  Lab Oct 12, 2017 1313  AST 56*  ALT 51*  ALKPHOS 61  BILITOT 1.3*  PROT 6.0*  ALBUMIN 2.4*   No results for input(s): LIPASE, AMYLASE in the last 168 hours. No results for input(s): AMMONIA in the last 168 hours. CBC: Recent Labs  Lab Oct 08, 2017 1313  WBC 59.6*  NEUTROABS 37.0*  HGB 4.6*  HCT 12.7*  MCV 122.1*  PLT 86*   Cardiac Enzymes: No results for input(s): CKTOTAL, CKMB, CKMBINDEX, TROPONINI in the last 168 hours. D-Dimer No results for input(s): DDIMER in the last 72 hours. BNP: Invalid input(s): POCBNP CBG: No results for input(s): GLUCAP in the last 168 hours. Anemia work  up No results for input(s): VITAMINB12, FOLATE, FERRITIN, TIBC, IRON, RETICCTPCT in the last 72 hours. Urinalysis    Component Value Date/Time   COLORURINE AMBER (A) 10/08/17 1313   APPEARANCEUR HAZY (A) 2017-10-08 1313   LABSPEC 1.018 October 08, 2017 1313   PHURINE 5.0 2017-10-08 1313   GLUCOSEU NEGATIVE October 08, 2017 1313   HGBUR NEGATIVE 10/08/2017 1313   BILIRUBINUR NEGATIVE 2017-10-08 1313   KETONESUR NEGATIVE 10/08/17 1313   PROTEINUR NEGATIVE 10/08/17 1313   NITRITE NEGATIVE 08-Oct-2017 1313   LEUKOCYTESUR NEGATIVE 10/08/2017 1313   Sepsis Labs Invalid input(s): PROCALCITONIN,  WBC,  LACTICIDVEN     SIGNED:  Gilles Chiquito, MD  [facility] 09/24/2017, 11:36 AM Pager [phone]  If 7PM-7AM, please contact night-coverage [email or web address] Password [email or web address]

## 2017-10-20 NOTE — ED Notes (Signed)
Call [name], pt's caregiver, at C #[phone].

## 2017-10-20 NOTE — Progress Notes (Addendum)
ANTIBIOTIC CONSULT NOTE - INITIAL  Pharmacy Consult for vancomin and zosyn Indication: sepsis  No Known Allergies  Patient Measurements: Height: 5' (152.4 cm) Weight: 126 lb (57.2 kg) IBW/kg (Calculated) : 50  Vital Signs: Temp: 102 F (38.9 C) (08/03 1218) Temp Source: Oral (08/03 1218) BP: 137/109 (08/03 1216) Pulse Rate: 95 (08/03 1216) Intake/Output from previous day: No intake/output data recorded. Intake/Output from this shift: No intake/output data recorded.  Labs: No results for input(s): WBC, HGB, PLT, LABCREA, CREATININE in the last 72 hours. Estimated Creatinine Clearance: 32.1 mL/min (A) (by C-G formula based on SCr of 1.45 mg/dL (H)). No results for input(s): VANCOTROUGH, VANCOPEAK, VANCORANDOM, GENTTROUGH, GENTPEAK, GENTRANDOM, TOBRATROUGH, TOBRAPEAK, TOBRARND, AMIKACINPEAK, AMIKACINTROU, AMIKACIN in the last 72 hours.   Microbiology: No results found for this or any previous visit (from the past 720 hour(s)).  Medical History: Past Medical History:  Diagnosis Date  . Cancer [facility])    prostate  . Leukopenia   . Macrocytosis   . Mental retardation   . Thrombocytopenia ([place])     Assessment: Reginald Daniels is a 72 y.o. M, with history of developmental delay, thrombocytopenia, subdural hematoma, presenting today from home with reports of fever and hypotension. Code sepsis initiated. Pharmacy consulted to manage vancomycin and zosyn  8/3: Patient noted to be in AKI, Scr 5.53  elevated from baseline (~1.4), LA 2.42, Tmax 102, hypotensive, BCx drawn before abx initiated  Goal of Therapy:  Vancomycin trough level 15-20 mcg/ml  Plan:  Zosyn 3.375g IV x1 (given) Per renal function, decrease to Zosyn 2.25g IV q8h IV Vancomycin loading dose 1000mg  x1  Will check random vanc level at 48 hours and re-dose if <20 F/u on cultures/sensitivities  Monitor daily renal function, for resolution of infection  Thank you for involving pharmacy in this patient's  care.  Janae Bridgeman, PharmD PGY1 Pharmacy Resident Phone: [phone] 10/03/2017 2:53 PM

## 2017-10-20 NOTE — ED Notes (Signed)
md notified of pressures in 80's and continued tachypnea despite 2L [place] placed.  NRB placed.  Will re-assess bp when 250 bolus complete, per md.

## 2017-10-20 NOTE — Progress Notes (Addendum)
Attempted to call patient caregiver, Reginald Daniels, at number provided to update on care.  Discussed Reginald Daniels's declining blood pressure and need to start medications for comfort which may cause lower blood pressure.  Reginald Daniels expressed understanding that Reginald Daniels might not do well overnight and thanked the team for their care.    He has requested call for worsening status overnight if possible.   Gilles Chiquito, MD

## 2017-10-20 NOTE — H&P (Addendum)
History and Physical    Reginald Daniels FAO:[id number] DOB: [birth year] DOA: 2017-10-10  PCP: Marletta Lor, MD  Patient coming from: Home  Chief Complaint: Lethargy   HPI: Reginald Daniels is a 72 y.o. 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  Mr. Reginald Daniels (legal guardian) reports that last night Reginald Daniels was more lethargic, drooling more and required changing of shirt 3 times which is not normal for him.  This morning, Reginald Daniels did not want to get out of bed, so Reginald Daniels went and cut the grass.  When he came back, he was even more lethargic and 911 was called.  He was found to have extensive ecchymosis, particularly on the left left and knee.  He had abdominal pain.  In the ED, he was hypotensive with minimal response to fluids.  He required a non rebreather.  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    Reginald Daniels has been the caretaker for Reginald Daniels for about 1.5 years, and prior to that along with his wife.  His wife worked for a Art therapist and became Reginald Daniels guardian when his father died.  Reginald Daniels's wife died 1.5 years ago and asked for him to continue serving as Reginald Daniels guardian.  I do believe Reginald Daniels has Reginald Daniels's best interest at heart and reports that his wife went through intubation and other aggressive therapy at the end of her life, and he would not want that for Reginald Daniels.    ED Course: In the ED, initially treated for presumed sepsis prior to discussion with caregiver as noted above. On admission, Mr. Reginald Daniels had a Na of 150, K of 5.5, Cr of 5.53 (up from baseline around 1.45), WBC of close to 60 with low  H/H and platelets.    Review of Systems: As per HPI otherwise 10 point review of systems negative.  Patient unable to give history.    Past Medical History:  Diagnosis Date  . Cancer [facility])    prostate  . Leukopenia   . Macrocytosis   . Mental retardation   . Thrombocytopenia ([place])     Never had surgery   reports that he has never smoked. He has never used smokeless tobacco. He reports that he does not drink alcohol or use drugs.  No Known Allergies  Patient has no family and has mental retardation at baseline.  He is unable to provide any family history.    Prior to Admission medications   Medication Sig Start Date End Date Taking? Authorizing Provider  acetaminophen (TYLENOL) 325 MG tablet Take 2 tablets (650 mg total) by mouth every 6 (six) hours as needed for mild pain, moderate pain or headache. 08/24/17  Yes Minor, Grace Bushy, NP  b complex vitamins capsule Take 1 capsule by mouth daily. Patient taking differently: Take 2 capsules by mouth daily.  09/30/16  Yes Brunetta Genera, MD  buPROPion (WELLBUTRIN) 75 MG tablet Take 1 tablet (75 mg total) by mouth 2 (two) times daily. 09/12/17  Yes Marletta Lor, MD  cetirizine (ZYRTEC) 10 MG tablet Take 10 mg by mouth daily.   Yes [provider]  EPOETIN ALFA IJ Inject 1 each as directed every 14 (fourteen) days.  Yes [provider]  simethicone (MYLICON) 782 MG chewable tablet Chew 125 mg by mouth every 6 (six) hours as needed for flatulence.   Yes [provider]  escitalopram (LEXAPRO) 5 MG tablet Take 1 tablet (5 mg total) by mouth daily. Patient not taking: Reported on 2017/09/26 09/20/17   Marletta Lor, MD  magic mouthwash SOLN Take 5 mLs by mouth 3 (three) times daily. Patient not taking: Reported on 09/26/2017 08/24/17   Minor, Grace Bushy, NP    Physical Exam:  Constitutional: NAD, calm, comfortable Vitals:   2017/09/26 1500 2017-09-26 1515 2017-09-26 1530 09/26/17 1545  BP: (!) 86/44 (!)  94/37 (!) 90/40 (!) 91/39  Pulse: 86 86 85 85  Resp: (!) 34 (!) 33 (!) 34 (!) 32  Temp:      TempSrc:      SpO2: 100% 100% 100% 100%  Weight:      Height:       Eyes: pale conjunctivae, no scleral icterus ENMT: Mucous membranes are somewhat dry, Facemask in place Neck: normal, supple Respiratory: Course breath sounds bilaterally, tachypneic Cardiovascular: Tachycardia, regular, no murmur Abdomen: + ttp throughout and voluntary guarding, +BS  Musculoskeletal: Large ecchymoses on posterior thigh on the left, thin extremities Skin: Ecchymoses, appears pale Neurologic: Unable to assess, patient not responding to commands Psychiatric: Lethargic, uncomfortable.   Labs on Admission: I have personally reviewed following labs and imaging studies  CBC: Recent Labs  Lab 09-26-17 1313  WBC 59.6*  NEUTROABS 37.0*  HGB 4.6*  HCT 12.7*  MCV 122.1*  PLT 86*   Basic Metabolic Panel: Recent Labs  Lab Sep 26, 2017 1313  NA 150*  K 5.5*  CL 124*  CO2 13*  GLUCOSE 103*  BUN 110*  CREATININE 5.53*  CALCIUM 8.4*   GFR: Estimated Creatinine Clearance: 8.4 mL/min (A) (by C-G formula based on SCr of 5.53 mg/dL (H)). Liver Function Tests: Recent Labs  Lab Sep 26, 2017 1313  AST 56*  ALT 51*  ALKPHOS 61  BILITOT 1.3*  PROT 6.0*  ALBUMIN 2.4*   No results for input(s): LIPASE, AMYLASE in the last 168 hours. No results for input(s): AMMONIA in the last 168 hours. Coagulation Profile: Recent Labs  Lab 09/26/17 1313  INR 1.81   Cardiac Enzymes: No results for input(s): CKTOTAL, CKMB, CKMBINDEX, TROPONINI in the last 168 hours. BNP (last 3 results) No results for input(s): PROBNP in the last 8760 hours. HbA1C: No results for input(s): HGBA1C in the last 72 hours. CBG: No results for input(s): GLUCAP in the last 168 hours. Lipid Profile: No results for input(s): CHOL, HDL, LDLCALC, TRIG, CHOLHDL, LDLDIRECT in the last 72 hours. Thyroid Function Tests: No results for input(s): TSH,  T4TOTAL, FREET4, T3FREE, THYROIDAB in the last 72 hours. Anemia Panel: No results for input(s): VITAMINB12, FOLATE, FERRITIN, TIBC, IRON, RETICCTPCT in the last 72 hours. Urine analysis:    Component Value Date/Time   COLORURINE AMBER (A) 09-26-2017 1313   APPEARANCEUR HAZY (A) Sep 26, 2017 1313   LABSPEC 1.018 09-26-17 1313   PHURINE 5.0 26-Sep-2017 1313   GLUCOSEU NEGATIVE 2017-09-26 1313   HGBUR NEGATIVE 09-26-17 1313   BILIRUBINUR NEGATIVE 09-26-17 1313   KETONESUR NEGATIVE Sep 26, 2017 1313   PROTEINUR NEGATIVE Sep 26, 2017 1313   NITRITE NEGATIVE 09-26-17 1313   LEUKOCYTESUR NEGATIVE Sep 26, 2017 1313    Radiological Exams on Admission: Dg Chest Portable 1 View  Result Date: September 26, 2017 CLINICAL DATA:  Per ED notes, pt here from home [facility]. Son was dressing pt today and noted  that he was lethargic. Son states recent falls with subdural hematoma. Temp 102 oral. RR42, 98% RA. Non-smoker EXAM: PORTABLE CHEST 1 VIEW COMPARISON:  08/21/2017 FINDINGS: The heart is enlarged with prominent LEFT ventricular contour. Hiatal hernia. The lungs are free of focal consolidations and pleural effusions. No pulmonary edema. IMPRESSION: Cardiomegaly.  Hiatal hernia. Electronically Signed   By: Nolon Nations M.D.   On: 10/03/2017 13:13    EKG: Independently reviewed. 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  I discussed this with Reginald Daniels, patient's caregiver and guardian - Transition to comfort care measures - Palliative care consult    Subdural hematoma, post-traumatic - Patient is only responsive to painful stimuli at this point, no further imaging  Mental Retardation, depression - Holding home medications  AKI, hypernatremia, hyperkalemia - Related to above worsening of disease and possible infection.  Caregiver discussed with ED and then with myself,  will transition to comfort care measures and DNR.  - Palliative care consult.    DVT prophylaxis: None, comfort measures Code Status: DNR Family Communication: Reginald Daniels, guardian at bedside (c) [phone]; (h) [phone] Disposition Plan: Admit for comfort measures, placement. Reginald Daniels is unsure he can care for Mr. Newby at home Consults called: Palliative Care Admission status: Med-Surg, inpatient    Gilles Chiquito MD [facility] Pager [phone]  If 7PM-7AM, please contact night-coverage [email or web address] Password [facility]  10-03-2017, 4:43 PM

## 2017-10-20 NOTE — Progress Notes (Signed)
Received patient from ED, non responsive pupils sluggish at size 1. Patient moaning and groaning on repositioning. MD made aware. See new orders for morphin

## 2017-10-20 NOTE — Progress Notes (Addendum)
Jun 10, 2108 patient found not breathing with faint pulse. Patient heart stopped shortly after. Second nurse verified, time of death 06-11-10. Paged provider on call requested death certificate to be signed.  HCPOA [name] called and notified.  [facility] called, pt not a tissue or eye donation candidate. Ref #[id number] with [facility].

## 2017-10-20 NOTE — ED Triage Notes (Signed)
Pt here from home via [facility].  Son was dressing pt today and noted that he was lethargic.  Son states recent falls with subdural hematoma.  Temp 102 oral.  Pupils equal and reactive.  Initial pressure 80/40 that increased to 107/50 with 600 ns bolus en-route.  RR42, 98% RA.

## 2017-10-20 NOTE — ED Provider Notes (Addendum)
[place] [facility] EMERGENCY DEPARTMENT Provider Note   CSN: [id number] Arrival date & time: October 07, 2017  1207     History   Chief Complaint Chief Complaint  Patient presents with  . Code Sepsis    HPI Reginald Daniels is a 72 y.o. male. 5 caveat secondary to patient not being communicative. HPI  72 year old male history of developmental delay, thrombocytopenia, subdural hematoma presents today from home with reports of fever and hypotension.  Patient's history is given by his power of attorney who is also his caregiver, [name].  He reported that patient has been increasingly lethargic.  States he had recently fallen and had a subdural hematoma.  When EMS arrived his temperature was 102 and blood pressure 80/40.  He is given 600 cc of normal saline report of increased blood pressure 107/50 with respiratory rate of 42 and O2 sats 98%.  He was identified as a code sepsis.  Past Medical History:  Diagnosis Date  . Cancer [facility])    prostate  . Leukopenia   . Macrocytosis   . Mental retardation   . Thrombocytopenia [facility])     Patient Active Problem List   Diagnosis Date Noted  . Subdural hematoma, post-traumatic ([place]) 08/21/2017  . Intraventricular hemorrhage ([place]) 08/21/2017  . 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 Leukocytosis 08/21/2017  . MDS (myelodysplastic syndrome) ([place]) 10/26/2016  . Macrocytic anemia 02/22/2010  . Leukopenia 02/22/2010  . 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 MENTAL RETARDATION 11/22/2009    No past surgical history on file.      Home Medications    Prior to Admission medications   Medication Sig Start Date End Date Taking? Authorizing Provider  acetaminophen (TYLENOL) 325 MG tablet Take 2 tablets (650 mg total) by mouth every 6 (six) hours as needed for mild pain, moderate pain or headache. 08/24/17   Minor, Grace Bushy, NP  b complex vitamins capsule Take 1 capsule by mouth daily. 09/30/16   Brunetta Genera, MD  buPROPion  (WELLBUTRIN) 75 MG tablet Take 1 tablet (75 mg total) by mouth 2 (two) times daily. 09/12/17   Marletta Lor, MD  cetirizine (ZYRTEC) 10 MG tablet Take 10 mg by mouth daily.    [provider]  EPOETIN ALFA IJ Inject 1 each as directed every 14 (fourteen) days.    [provider]  escitalopram (LEXAPRO) 5 MG tablet Take 1 tablet (5 mg total) by mouth daily. 09/20/17   Marletta Lor, MD  magic mouthwash SOLN Take 5 mLs by mouth 3 (three) times daily. 08/24/17   Minor, Grace Bushy, NP    Family History No family history on file.  Social History Social History   Tobacco Use  . Smoking status: Never Smoker  . Smokeless tobacco: Never Used  Substance Use Topics  . Alcohol use: No  . Drug use: No     Allergies   Patient has no known allergies.   Review of Systems Review of Systems  Unable to perform ROS: Acuity of condition     Physical Exam Updated Vital Signs BP (!) 137/109 (BP Location: Right Arm)   Pulse 95   Temp (!) 102 F (38.9 C) (Oral)   Resp (!) 40   Ht 1.524 m (5')   Wt 57.2 kg (126 lb)   SpO2 94%   BMI 24.61 kg/m   Physical Exam  Constitutional: He appears well-developed. He appears distressed.  Chronically ill-appearing male who appears to be very dehydrated  HENT:  Head: Atraumatic.  Right Ear: External ear normal.  Left Ear: External ear normal.  Mucous membranes are extremely dry  Eyes: Pupils are equal, round, and reactive to light.  Neck: Normal range of motion.  Cardiovascular: Normal rate and regular rhythm.  Pulmonary/Chest:  Increased respiratory rate and increased work of breathing No rhonchi or wheezing are noted  Abdominal: Soft.  Diffuse tenderness is tenderness to palpation  Genitourinary: Penis normal.  Musculoskeletal:  Right upper extremity appears to be contractured Contusion medial left lower extremity Patient appears to have pain with movement of right lower extremity although no obvious deformities  are noted  Neurological:  Patient is oriented to his name and able to answer some questions but does not appear clear on place or date  Skin: Skin is warm and dry.  Nursing note and vitals reviewed.    ED Treatments / Results  Labs (all labs ordered are listed, but only abnormal results are displayed) Labs Reviewed  CULTURE, BLOOD (ROUTINE X 2)  CULTURE, BLOOD (ROUTINE X 2)  COMPREHENSIVE METABOLIC PANEL  CBC WITH DIFFERENTIAL/PLATELET  PROTIME-INR  URINALYSIS, ROUTINE W REFLEX MICROSCOPIC  I-STAT CG4 LACTIC ACID, ED    EKG EKG Interpretation  Date/Time:  2017/09/24 12:12:00 EDT Ventricular Rate:  92 PR Interval:    QRS Duration: 115 QT Interval:  368 QTC Calculation: 456 R Axis:   -49 Text Interpretation:  Normal sinus rhythm Left anterior fasicular block SINCE LAST TRACING HEART RATE HAS INCREASED Confirmed by Pattricia Boss [phone]) on 09/24/2017 4:20:53 PM   Radiology Dg Chest Portable 1 View  Result Date: 09/24/17 CLINICAL DATA:  Per ED notes, pt here from home [facility]. Son was dressing pt today and noted that he was lethargic. Son states recent falls with subdural hematoma. Temp 102 oral. RR42, 98% RA. Non-smoker EXAM: PORTABLE CHEST 1 VIEW COMPARISON:  08/21/2017 FINDINGS: The heart is enlarged with prominent LEFT ventricular contour. Hiatal hernia. The lungs are free of focal consolidations and pleural effusions. No pulmonary edema. IMPRESSION: Cardiomegaly.  Hiatal hernia. Electronically Signed   By: Nolon Nations M.D.   On: 09/24/17 13:13    Procedures .Critical Care Performed by: Pattricia Boss, MD Authorized by: Pattricia Boss, MD   Critical care provider statement:    Critical care time (minutes):  30   Critical care was necessary to treat or prevent imminent or life-threatening deterioration of the following conditions:  Circulatory failure, dehydration, renal failure, respiratory failure and sepsis   Critical care was time spent personally  by me on the following activities:  Discussions with consultants, evaluation of patient's response to treatment, examination of patient, ordering and performing treatments and interventions, ordering and review of laboratory studies, ordering and review of radiographic studies, pulse oximetry, re-evaluation of patient's condition, obtaining history from patient or surrogate, review of old charts and development of treatment plan with patient or surrogate   (including critical care time)  Medications Ordered in ED Medications - No data to display   Initial Impression / Assessment and Plan / ED Course  I have reviewed the triage vital signs and the nursing notes.  Pertinent labs & imaging results that were available during my care of the patient were reviewed by me and considered in my medical decision making (see chart for details).    Chronically ill-appearing male with low dysplastic syndrome presents today with fever, hypotension, symptoms consistent with sepsis.  Is treated here with IV fluid and remains hypotensive.  Is elevated lactic acid.  He also  has multiple metabolic derangements including sodium of 150 and newly elevated creatinine to 5.53.  White blood cell count is now elevated at 59,600. I discussed aggressive management with Mr. Domingo Cocking.  Mr. Domingo Cocking does not wish the patient to have aggressive care.  We have discussed palliative care.  We had a long discussion entailing details of each route and he is to be made DNR.  We will proceed with palliative care. Patient care discussed with Dr. Daryll Drown and comfort care in place.  Final Clinical Impressions(s) / ED Diagnoses   Final diagnoses:  Hypotension, unspecified hypotension type  Sepsis, due to unspecified organism [facility])  Palliative care status    ED Discharge Orders    None       Pattricia Boss, MD 10-14-17 1624    Pattricia Boss, MD 10/14/2017 [phone]

## 2017-10-20 NOTE — ED Notes (Signed)
MD notified of abnormal labs.  At bedside speaking with caregiver [name] (pt has no family).  After much consideration, Reginald Daniels has chosen to not go the aggressive route.

## 2017-10-20 NOTE — ED Notes (Signed)
Attempted report.  RN stated to bring pt up in 20 min.

## 2017-10-20 NOTE — ED Notes (Signed)
Notified Rt of need for blood gas.

## 2017-10-20 DEATH — deceased

## 2017-10-22 ENCOUNTER — Other Ambulatory Visit: Payer: Medicare Other

## 2017-10-22 ENCOUNTER — Ambulatory Visit: Payer: Medicare Other

## 2017-10-22 ENCOUNTER — Ambulatory Visit: Payer: Medicare Other | Admitting: Hematology

## 2017-10-28 ENCOUNTER — Other Ambulatory Visit: Payer: Medicare Other

## 2017-10-28 ENCOUNTER — Ambulatory Visit: Payer: Medicare Other

## 2017-11-04 ENCOUNTER — Ambulatory Visit: Payer: Medicare Other

## 2017-11-04 ENCOUNTER — Other Ambulatory Visit: Payer: Medicare Other

## 2020-02-27 IMAGING — CT CT CERVICAL SPINE W/O CM
5 of 7 series · 13 of 33 positions shown, 14 images · non-contrast
Comparison: None.

CLINICAL DATA: Fall with laceration to the head

EXAM:
CT HEAD WITHOUT CONTRAST
CT CERVICAL SPINE WITHOUT CONTRAST
TECHNIQUE: Multidetector CT imaging of the head and cervical spine was
performed following the standard protocol without intravenous
contrast. Multiplanar CT image reconstructions of the cervical spine
were also generated.

[Series 5: head bone · axial · 0.44mm/px · z∈[-61,-3]mm · 2 of 87 slices shown]
[im 29/87  bone]
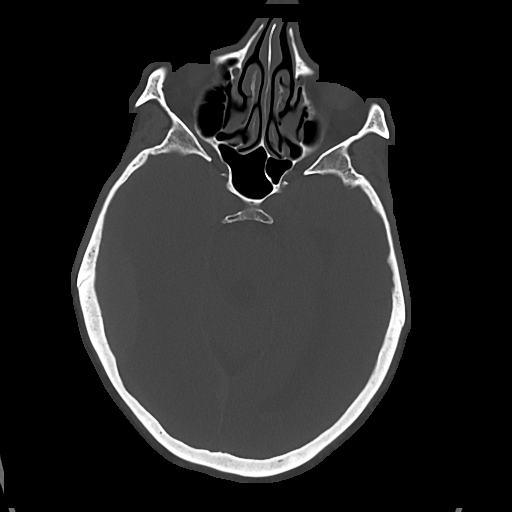
[im 58/87  bone]
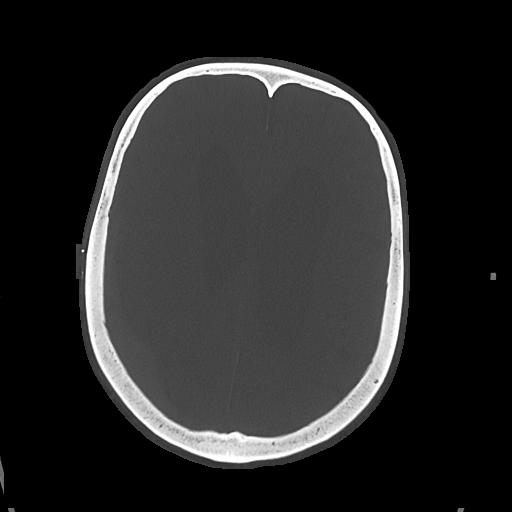

[Series 6: cor soft · coronal · 0.37mm/px · 3 of 73 slices shown]
[im 22/73  bone]
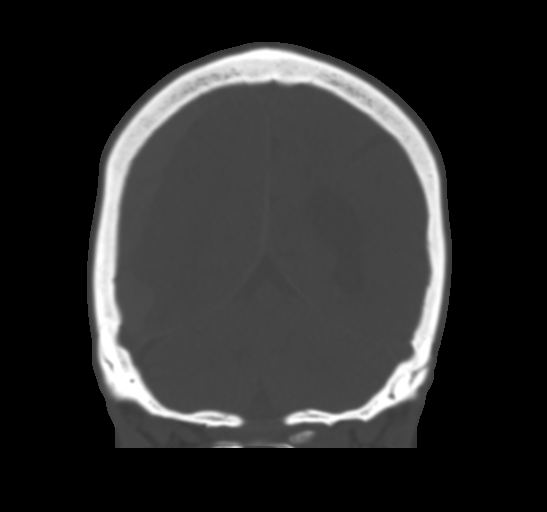
[im 32/73  bone]
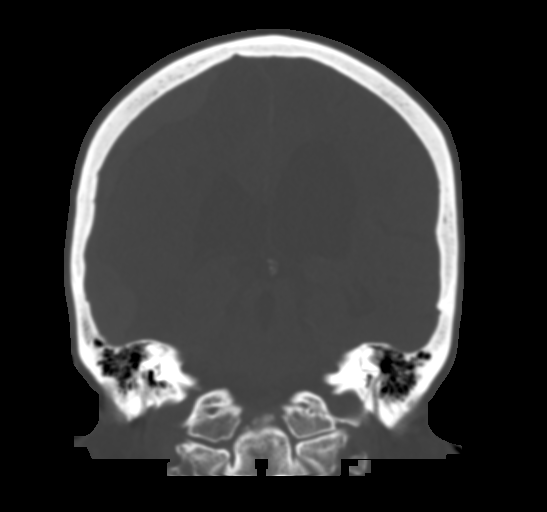
[im 41/73  bone]
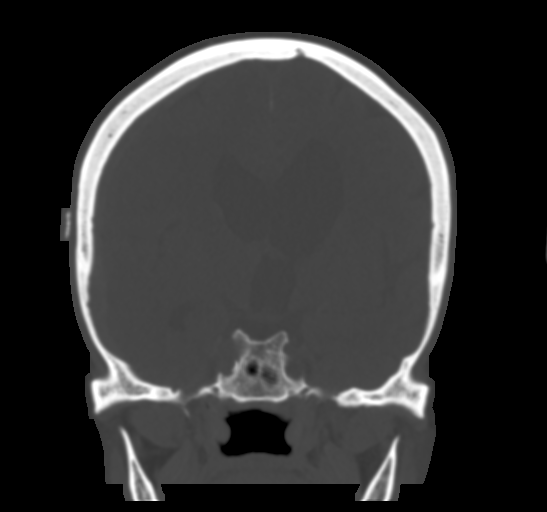

[Series 9: c spine soft · axial · 0.36mm/px · z∈[-194,-128]mm · 2 of 98 slices shown]
[im 33/98  soft-tissue]
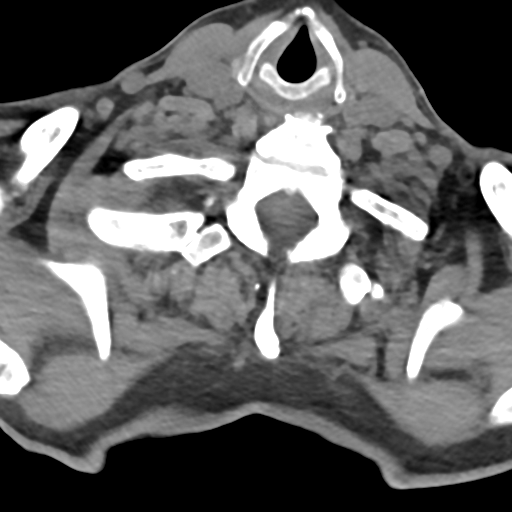
[im 65/98  soft-tissue]
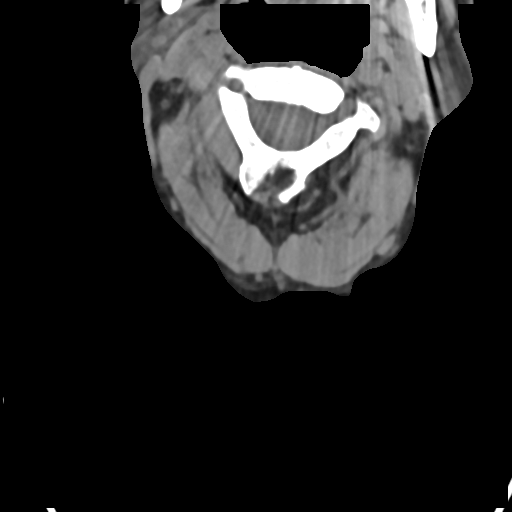

[Series 10: sag bone · sagittal · 0.24mm/px · 4 of 61 slices shown]
[im 13/61  bone]
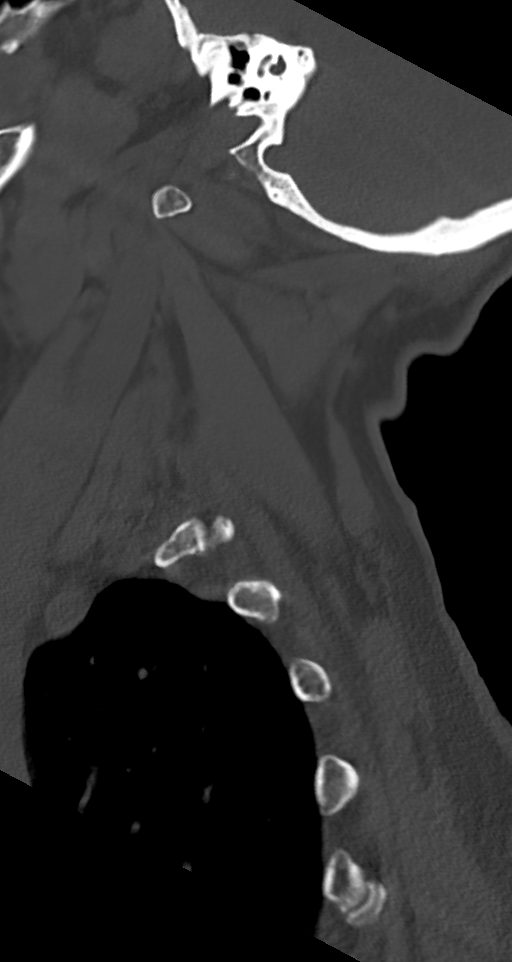
[im 25/61  bone]
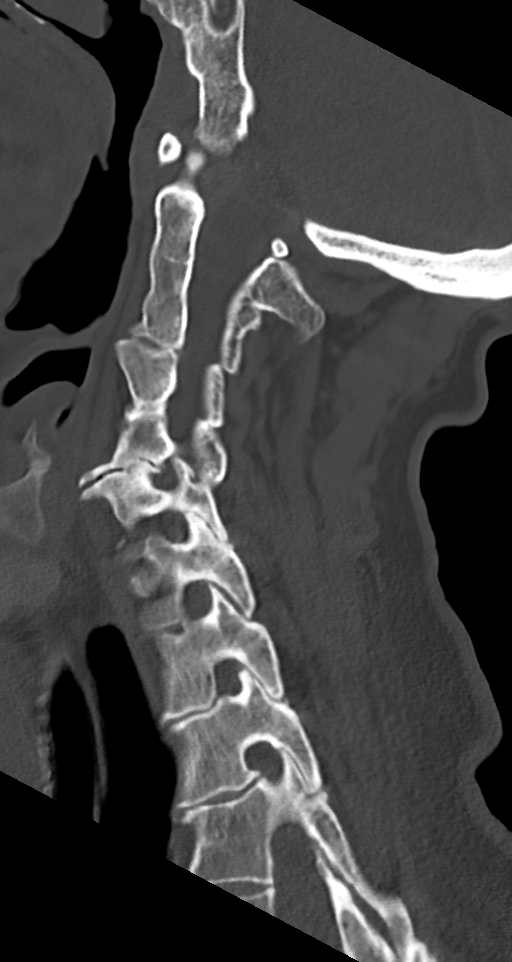
[im 37/61  bone]
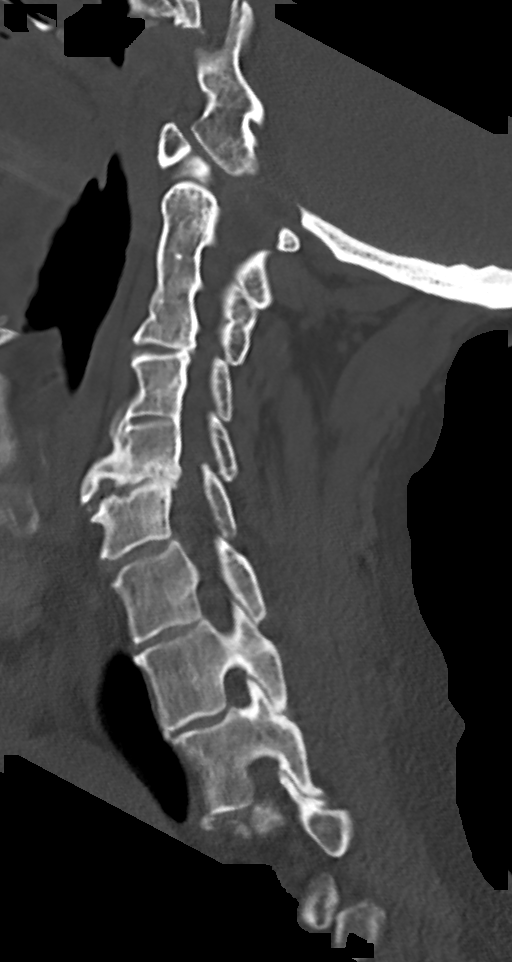
[im 49/61  bone]
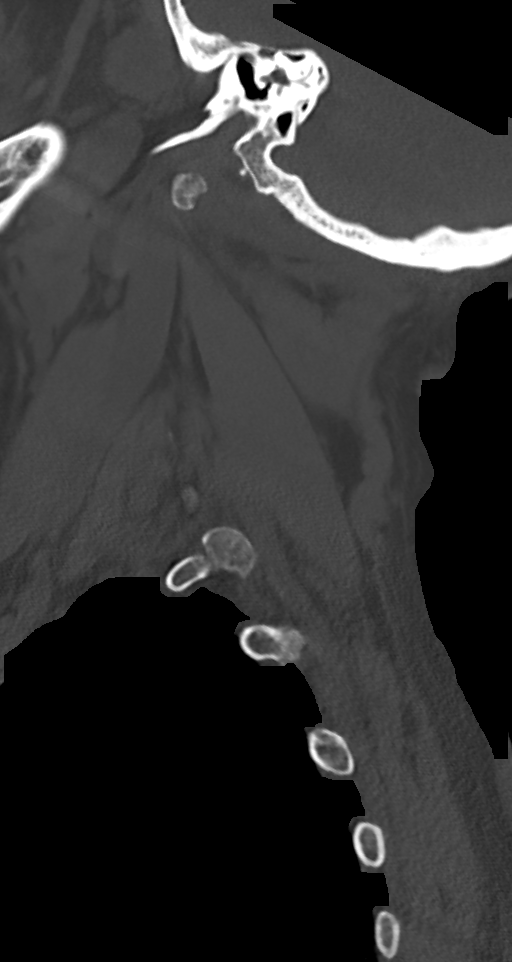

[Series 12: orthogonal axials · axial · 0.21mm/px · z∈[-221,-162]mm · 2 of 103 slices shown, 3 images]
[im 35/103  soft-tissue]
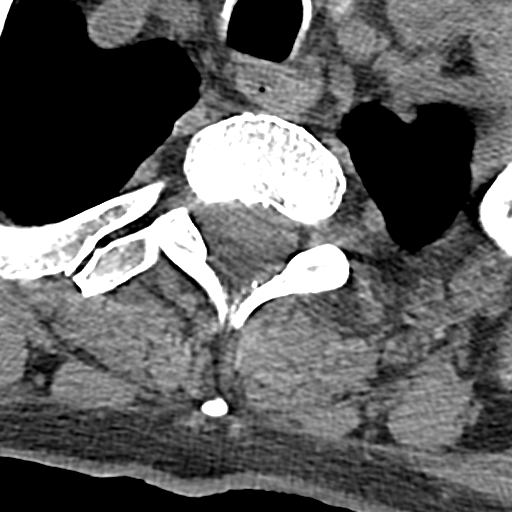
[im 35/103  bone]
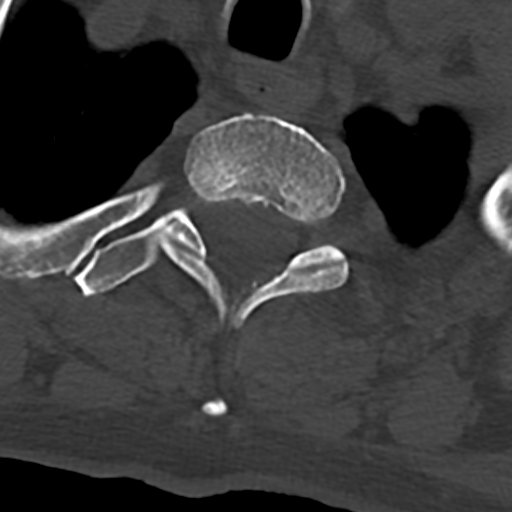
[im 69/103  bone]
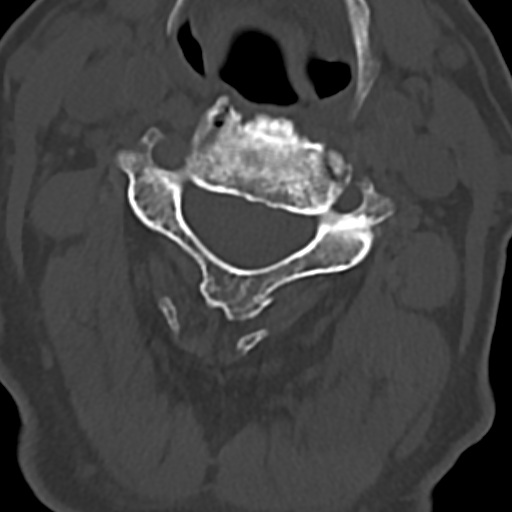

[13 of 33 positions shown; findings below may reference images not displayed]

FINDINGS: CT HEAD FINDINGS

Brain: No acute territorial infarction or intracranial mass is
visualized. Acute right subdural hematoma, measuring 18 mm maximum
thickness in the right posterior temporal region. Convex margin of
the hematoma along the right temporal convexity. Small amount of
posterior inter hemispheric hemorrhage on the right. No significant
midline shift. Localized mass effect on the right lateral ventricle.
Small hematocrit level in the left lateral ventricle. Atrophy and
small vessel ischemic changes of the white matter. The ventricular
system is diffusely enlarged.

Vascular: No hyperdense vessels.  Carotid vascular calcification.

Skull: No fracture seen.

Sinuses/Orbits: Mucosal thickening in the ethmoid sinuses.

Other: None

CT CERVICAL SPINE FINDINGS

Alignment: Trace retrolisthesis C6 on C7. Facet alignment within
normal limits.

Skull base and vertebrae: Flattening of the occipital condyles
without dislocation. No fracture

Soft tissues and spinal canal: No prevertebral fluid or swelling. No
visible canal hematoma.

Disc levels: Fusion of C2-C3 and C4. Partial fusion of C5-C6. Marked
degenerative change at C6-C7. Incomplete fusion anterior arch of C1

Upper chest: Lung apices are clear.  No thyroid mass

Other: None
IMPRESSION: 1. Acute right hemispheric extra-axial hematoma measuring up to 18
mm in thickness. Although there is some convex margin of the blood
collection at the posterior temporal lobe, suspect that hematoma is
subdural. Small amount of posterior interhemispheric and right
tentorial subdural hematoma. Small amount of left intraventricular
hemorrhage. Localized mass effect on the right lateral ventricle but
without significant midline shift.
2. Ventriculomegaly
3. Tiger - Kiza abnormality of the cervical spine with anterior
arch anomaly at C1. No acute fracture is seen.

Critical Value/emergent results were called by telephone at the time
of interpretation on 08/21/2017 at [DATE] to Dr. MEERA WIEGAND , who
verbally acknowledged these results.

## 2020-02-27 IMAGING — DX DG CHEST 1V PORT
1 series · 1 of 1 positions shown · non-contrast
Comparison: 03/22/2015

CLINICAL DATA: Fell today.  Possible additional recent falls.

EXAM:
PORTABLE CHEST 1 VIEW

[chest ap]
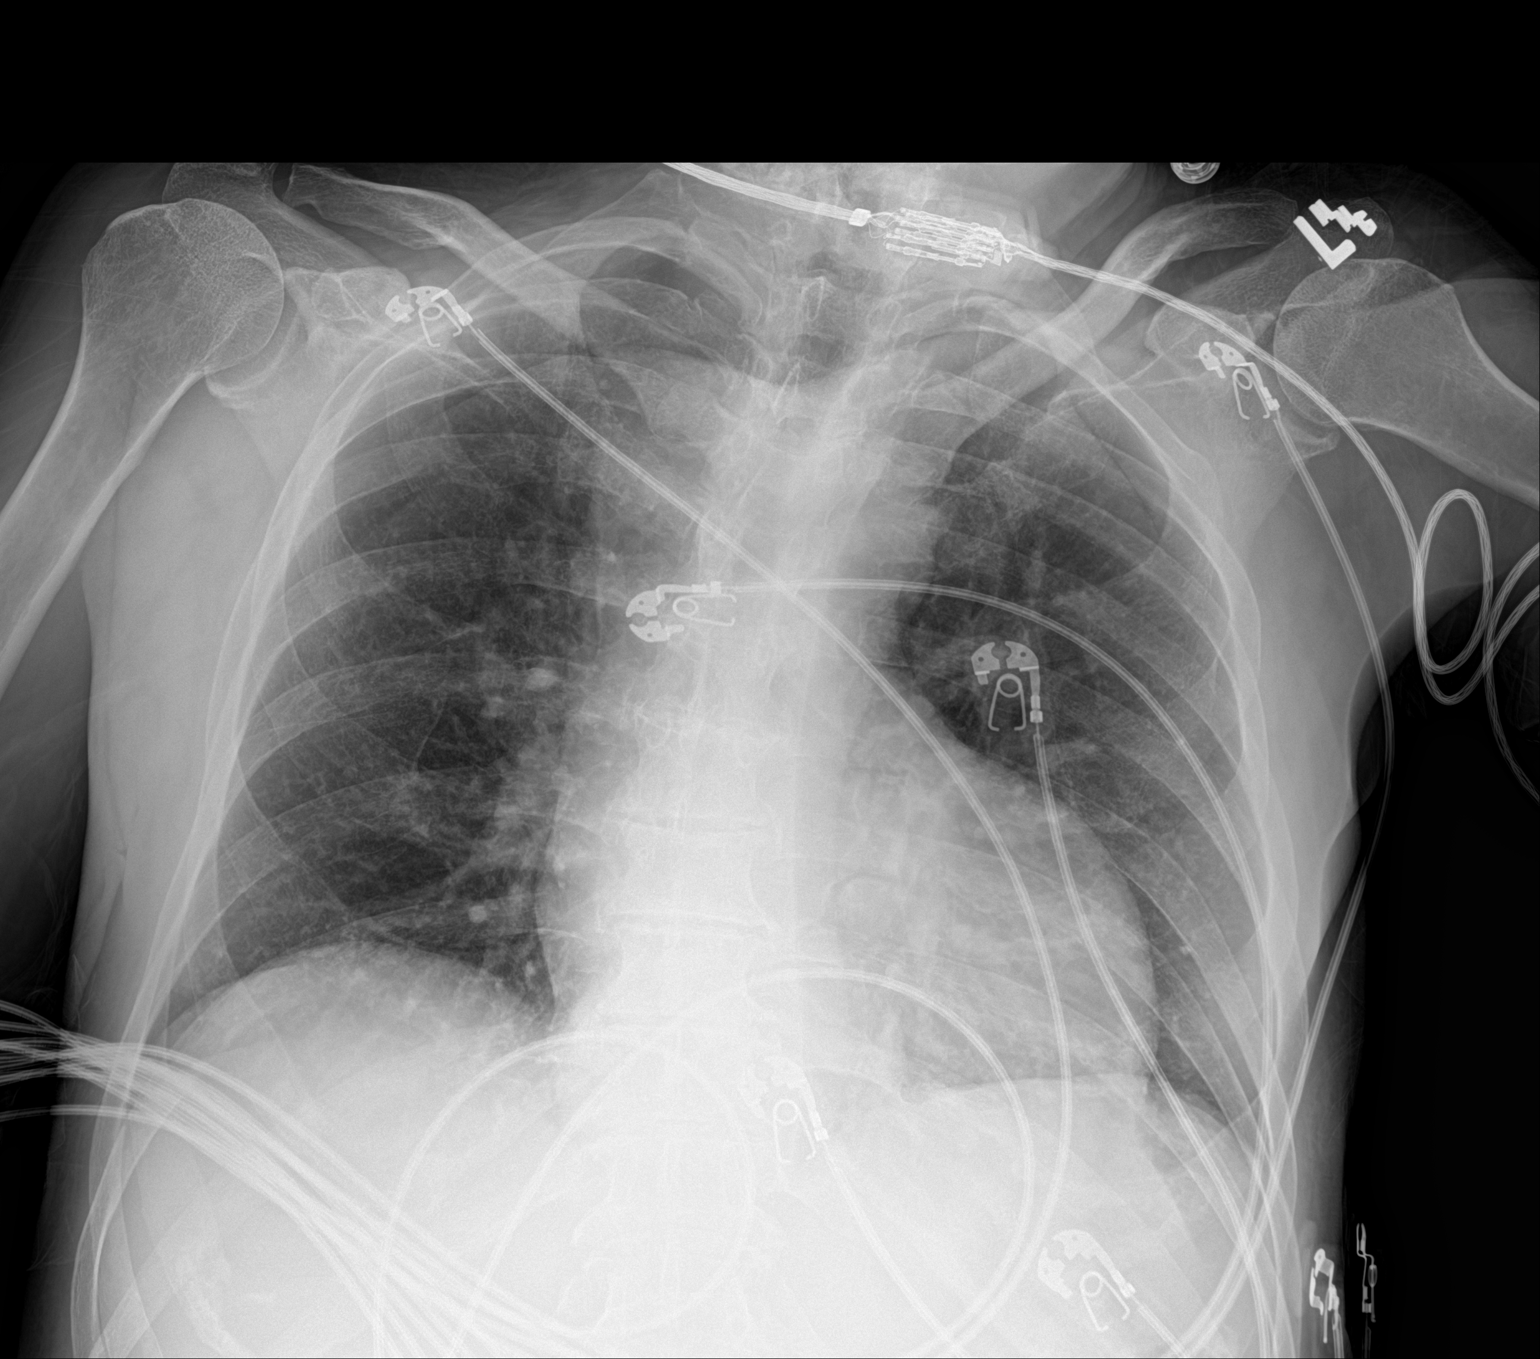

[1 of 1 positions shown; findings below may reference images not displayed]

FINDINGS: Heart size is normal for technique. No vascular congestion, edema,
or consolidation. No blunting of costophrenic angles. No
pneumothorax. Mediastinal contours appear intact. Degenerative
changes in the spine.
IMPRESSION: No active disease.
